# Patient Record
Sex: Female | Born: 1965 | Race: White | Hispanic: No | Marital: Single | Smoking: Current every day smoker
Health system: Southern US, Community
[De-identification: ages and names within clinical notes are randomized; demographics above are authoritative.]

## PROBLEM LIST (undated history)

## (undated) DIAGNOSIS — J189 Pneumonia, unspecified organism: Secondary | ICD-10-CM

## (undated) DIAGNOSIS — Z1371 Encounter for nonprocreative screening for genetic disease carrier status: Secondary | ICD-10-CM

## (undated) DIAGNOSIS — A64 Unspecified sexually transmitted disease: Secondary | ICD-10-CM

## (undated) DIAGNOSIS — B977 Papillomavirus as the cause of diseases classified elsewhere: Secondary | ICD-10-CM

## (undated) DIAGNOSIS — Z923 Personal history of irradiation: Secondary | ICD-10-CM

## (undated) DIAGNOSIS — S81852A Open bite, left lower leg, initial encounter: Secondary | ICD-10-CM

## (undated) DIAGNOSIS — C50919 Malignant neoplasm of unspecified site of unspecified female breast: Secondary | ICD-10-CM

## (undated) DIAGNOSIS — W540XXA Bitten by dog, initial encounter: Secondary | ICD-10-CM

## (undated) DIAGNOSIS — G4730 Sleep apnea, unspecified: Secondary | ICD-10-CM

## (undated) HISTORY — DX: Sleep apnea, unspecified: G47.30

## (undated) HISTORY — DX: Encounter for nonprocreative screening for genetic disease carrier status: Z13.71

## (undated) HISTORY — DX: Malignant neoplasm of unspecified site of unspecified female breast: C50.919

## (undated) HISTORY — DX: Unspecified sexually transmitted disease: A64

## (undated) HISTORY — PX: WISDOM TOOTH EXTRACTION: SHX21

## (undated) HISTORY — DX: Bitten by dog, initial encounter: W54.0XXA

## (undated) HISTORY — DX: Pneumonia, unspecified organism: J18.9

## (undated) HISTORY — DX: Papillomavirus as the cause of diseases classified elsewhere: B97.7

## (undated) HISTORY — DX: Open bite, left lower leg, initial encounter: S81.852A

---

## 2014-12-20 DIAGNOSIS — Z1371 Encounter for nonprocreative screening for genetic disease carrier status: Secondary | ICD-10-CM

## 2014-12-20 DIAGNOSIS — B977 Papillomavirus as the cause of diseases classified elsewhere: Secondary | ICD-10-CM

## 2014-12-20 DIAGNOSIS — Z923 Personal history of irradiation: Secondary | ICD-10-CM

## 2014-12-20 HISTORY — DX: Papillomavirus as the cause of diseases classified elsewhere: B97.7

## 2014-12-20 HISTORY — DX: Encounter for nonprocreative screening for genetic disease carrier status: Z13.71

## 2014-12-20 HISTORY — PX: BREAST LUMPECTOMY: SHX2

## 2014-12-20 HISTORY — DX: Personal history of irradiation: Z92.3

## 2015-02-18 DIAGNOSIS — J189 Pneumonia, unspecified organism: Secondary | ICD-10-CM

## 2015-02-18 HISTORY — DX: Pneumonia, unspecified organism: J18.9

## 2015-02-26 DIAGNOSIS — F17200 Nicotine dependence, unspecified, uncomplicated: Secondary | ICD-10-CM | POA: Insufficient documentation

## 2015-02-26 DIAGNOSIS — R590 Localized enlarged lymph nodes: Secondary | ICD-10-CM | POA: Insufficient documentation

## 2015-02-26 DIAGNOSIS — R911 Solitary pulmonary nodule: Secondary | ICD-10-CM | POA: Insufficient documentation

## 2015-02-26 DIAGNOSIS — R609 Edema, unspecified: Secondary | ICD-10-CM | POA: Insufficient documentation

## 2015-02-26 DIAGNOSIS — N390 Urinary tract infection, site not specified: Secondary | ICD-10-CM | POA: Insufficient documentation

## 2015-02-27 DIAGNOSIS — J9601 Acute respiratory failure with hypoxia: Secondary | ICD-10-CM | POA: Insufficient documentation

## 2015-02-27 DIAGNOSIS — D869 Sarcoidosis, unspecified: Secondary | ICD-10-CM | POA: Insufficient documentation

## 2015-02-27 DIAGNOSIS — J101 Influenza due to other identified influenza virus with other respiratory manifestations: Secondary | ICD-10-CM | POA: Insufficient documentation

## 2015-03-07 ENCOUNTER — Emergency Department: Payer: Self-pay | Admitting: Emergency Medicine

## 2015-03-07 DIAGNOSIS — S81852A Open bite, left lower leg, initial encounter: Secondary | ICD-10-CM

## 2015-03-07 DIAGNOSIS — W540XXA Bitten by dog, initial encounter: Secondary | ICD-10-CM

## 2015-03-07 HISTORY — DX: Bitten by dog, initial encounter: S81.852A

## 2015-03-07 HISTORY — DX: Bitten by dog, initial encounter: W54.0XXA

## 2015-03-18 ENCOUNTER — Emergency Department: Payer: Self-pay | Admitting: Emergency Medicine

## 2015-04-16 ENCOUNTER — Other Ambulatory Visit: Payer: Self-pay

## 2015-04-16 DIAGNOSIS — Z13820 Encounter for screening for osteoporosis: Secondary | ICD-10-CM

## 2015-05-05 ENCOUNTER — Ambulatory Visit
Admission: RE | Admit: 2015-05-05 | Discharge: 2015-05-05 | Disposition: A | Discharge status: Home / Self Care | Payer: Managed Care, Other (non HMO) | Source: Ambulatory Visit | Attending: Family Medicine | Admitting: Family Medicine

## 2015-05-05 DIAGNOSIS — R928 Other abnormal and inconclusive findings on diagnostic imaging of breast: Secondary | ICD-10-CM | POA: Insufficient documentation

## 2015-05-05 DIAGNOSIS — Z13820 Encounter for screening for osteoporosis: Secondary | ICD-10-CM

## 2015-05-05 DIAGNOSIS — Z1231 Encounter for screening mammogram for malignant neoplasm of breast: Secondary | ICD-10-CM | POA: Diagnosis not present

## 2015-05-12 ENCOUNTER — Other Ambulatory Visit: Payer: Self-pay | Admitting: Family Medicine

## 2015-05-12 DIAGNOSIS — N63 Unspecified lump in unspecified breast: Secondary | ICD-10-CM

## 2015-05-12 DIAGNOSIS — R928 Other abnormal and inconclusive findings on diagnostic imaging of breast: Secondary | ICD-10-CM

## 2015-05-13 ENCOUNTER — Ambulatory Visit
Admission: RE | Admit: 2015-05-13 | Discharge: 2015-05-13 | Disposition: A | Discharge status: Home / Self Care | Payer: Managed Care, Other (non HMO) | Source: Ambulatory Visit | Attending: Family Medicine | Admitting: Family Medicine

## 2015-05-13 ENCOUNTER — Other Ambulatory Visit: Payer: Self-pay | Admitting: Family Medicine

## 2015-05-13 ENCOUNTER — Ambulatory Visit: Payer: Managed Care, Other (non HMO)

## 2015-05-13 DIAGNOSIS — N63 Unspecified lump in unspecified breast: Secondary | ICD-10-CM

## 2015-05-13 DIAGNOSIS — R928 Other abnormal and inconclusive findings on diagnostic imaging of breast: Secondary | ICD-10-CM | POA: Diagnosis present

## 2015-05-13 DIAGNOSIS — Z13820 Encounter for screening for osteoporosis: Secondary | ICD-10-CM

## 2015-05-15 ENCOUNTER — Other Ambulatory Visit: Payer: Self-pay | Admitting: Family Medicine

## 2015-05-15 DIAGNOSIS — R928 Other abnormal and inconclusive findings on diagnostic imaging of breast: Secondary | ICD-10-CM

## 2015-05-15 DIAGNOSIS — N63 Unspecified lump in unspecified breast: Secondary | ICD-10-CM

## 2015-05-21 ENCOUNTER — Other Ambulatory Visit: Payer: Self-pay | Admitting: Family Medicine

## 2015-05-21 DIAGNOSIS — R928 Other abnormal and inconclusive findings on diagnostic imaging of breast: Secondary | ICD-10-CM

## 2015-05-21 DIAGNOSIS — N63 Unspecified lump in unspecified breast: Secondary | ICD-10-CM

## 2015-05-27 ENCOUNTER — Ambulatory Visit
Admission: RE | Admit: 2015-05-27 | Discharge: 2015-05-27 | Disposition: A | Discharge status: Home / Self Care | Payer: Managed Care, Other (non HMO) | Source: Ambulatory Visit | Attending: Family Medicine | Admitting: Family Medicine

## 2015-05-27 DIAGNOSIS — N63 Unspecified lump in unspecified breast: Secondary | ICD-10-CM

## 2015-05-27 DIAGNOSIS — R928 Other abnormal and inconclusive findings on diagnostic imaging of breast: Secondary | ICD-10-CM

## 2015-05-27 DIAGNOSIS — C50811 Malignant neoplasm of overlapping sites of right female breast: Secondary | ICD-10-CM | POA: Diagnosis not present

## 2015-05-27 HISTORY — PX: BREAST BIOPSY: SHX20

## 2015-05-28 DIAGNOSIS — C50919 Malignant neoplasm of unspecified site of unspecified female breast: Secondary | ICD-10-CM

## 2015-05-28 HISTORY — PX: COLPOSCOPY: SHX161

## 2015-05-28 HISTORY — PX: BREAST SURGERY: SHX581

## 2015-05-28 HISTORY — DX: Malignant neoplasm of unspecified site of unspecified female breast: C50.919

## 2015-05-30 ENCOUNTER — Telehealth: Payer: Self-pay

## 2015-05-30 NOTE — Telephone Encounter (Signed)
Phoned patient ,and PCP to introduce Navigation service.  Patient reports she just talked to Dr. Bary Castilla, and her surgical consult is scheduled for Monday 06/02/15 at 3:00.  She is going to come by the [facility] to pick up Breast Cancer Treatment Handbook/folder with hospital services prior to that visit. Will collect history and high risk information for Breast Case Conference at that time. Oncology Nurse Navigator Documentation  Oncology Nurse Navigator Flowsheets 05/30/2015  Referral date to RadOnc/MedOnc 05/30/2015  Navigator Encounter Type Introductory phone call  Time Spent with Patient 30

## 2015-06-02 ENCOUNTER — Ambulatory Visit (INDEPENDENT_AMBULATORY_CARE_PROVIDER_SITE_OTHER): Payer: Managed Care, Other (non HMO) | Admitting: General Surgery

## 2015-06-02 ENCOUNTER — Other Ambulatory Visit: Payer: Managed Care, Other (non HMO)

## 2015-06-02 ENCOUNTER — Encounter: Payer: Self-pay | Admitting: General Surgery

## 2015-06-02 VITALS — BP 118/64 | HR 64 | Resp 12 | Ht 63.0 in | Wt 223.0 lb

## 2015-06-02 DIAGNOSIS — C50911 Malignant neoplasm of unspecified site of right female breast: Secondary | ICD-10-CM | POA: Diagnosis not present

## 2015-06-02 NOTE — Progress Notes (Signed)
Patient ID: Paula [name], female   DOB: 04/18/1966, 49 y.o.   MRN: [id number]  Chief Complaint  Patient presents with  . Other    discuss biopsy    HPI Paula [name] is a 49 y.o. female here today to discuss her right breast biopsy. She had two right breast biopsies done on 05/28/15, one of which was positive for breast cancer. Does not perform self breast checks. She does not get regular mammograms. Her mother Paula [name] is present for exam. She has one child who is 83 years old.  The patient reported that her prior mammogram was 3-4 years ago at which time she had appreciated some nodularity in the breast was identified with breast cysts. She had not noticed any changes in her breast prior to the recent screening mammogram and subsequent additional studies. HPI  Past Medical History  Diagnosis Date  . STD (female)   . HPV in female 2016  . Cancer 05/28/15    breast  . Dog bite of left lower leg 03/07/15  . Pneumonia 02/2015    Past Surgical History  Procedure Laterality Date  . Colposcopy  05/28/15    High grade squamous intraepithelial lesion  . Breast surgery Right 05/28/15    Family History  Problem Relation Age of Onset  . Cancer Maternal Aunt 21    breast    Social History History  Substance Use Topics  . Smoking status: Current Every Day Smoker -- 1.00 packs/day for 36 years    Types: Cigarettes  . Smokeless tobacco: Never Used  . Alcohol Use: 0.0 oz/week    0 Standard drinks or equivalent per week     Comment: occ    No Known Allergies  No current outpatient prescriptions on file.   No current facility-administered medications for this visit.    Review of Systems Review of Systems  Constitutional: Negative.   Respiratory: Negative.   Cardiovascular: Negative.     Blood pressure 118/64, pulse 64, resp. rate 12, height $RemoveBe'5\' 3"'KQGlrCBmQ$  (1.6 m), weight 223 lb (101.152 kg), last menstrual period 05/07/2015, SpO2 93 %.  Physical Exam Physical Exam   Constitutional: She is oriented to person, place, and time. She appears well-developed and well-nourished.  Neck: No thyromegaly present.  Cardiovascular: Normal rate, regular rhythm and normal heart sounds.   Pulmonary/Chest: Effort normal and breath sounds normal. Right breast exhibits no inverted nipple, no mass, no nipple discharge, no skin change and no tenderness. Left breast exhibits no inverted nipple, no mass, no nipple discharge, no skin change and no tenderness.    Left breast 2 cm nodule at 1 o'clock  Right breast 3 o'clock 2 cm mass mild bruise  Lymphadenopathy:    She has no cervical adenopathy.    She has no axillary adenopathy.       Right: No supraclavicular adenopathy present.  Right axillary node slightly enlarged  Neurological: She is alert and oriented to person, place, and time.  Skin: Skin is warm and dry.    Data Reviewed  [facility] mammogram dated 05/05/2015 showed multiple nodular areas in the upper-outer quadrant of left breast as well as a new dominant mass in the medial aspect of the right breast. BI-RADS-0.  Ultrasound examination of the right breast showed a mass in the upper-inner quadrant at the 2:30 o'clock position measuring up to 1.9 cm. Ultrasound-guided biopsy was recommended. In the upper-outer quadrant of the breast of the 10:00 position a 03 lobulated area thought to represent a  complex cyst or papillary cystic tumor was identified measuring up to 2.3 cm. Biopsy was recommended. Axillary nodes were reported as normal. The left breast showed multiple cysts measuring up to 3 cm in diameter. BI-RADS-5.  The patient subsequently underwent ultrasound-guided core biopsy by the radiology service on 05/27/2015. Both sites were biopsied with a 12-gauge vacuum device. Post biopsy imaging was completed.  Biopsy results show the lesion at the 10:00 position to represent scattered microcysts and abundant strips of detached April current epithelium. No atypia or  malignancy. Correlation with radiologic imaging was recommended. The dominant mass at the 2:30 o'clock position represented invasive mammary carcinoma. Histologic grade 1. No lymphovascular invasion noted. Receptor status is pending now 6 days status post biopsy.  Making use of Care Everywhere laboratory studies through her PCPs office were reviewed. Normal liver function studies. Normal CBC. She did recently have an episode of pneumonia during which time a CXR and chest CT was obtained.  EXAM: CHEST TWO VIEW DATE: 02/26/15 18:30:08 ACCESSION: [id number] UN DICTATED: 02/26/15 18:39:42 INTERPRETATION LOCATION: [facility]  CLINICAL INDICATION: 49 Year Old (F):  - . COUGH    COMPARISON: None available.  TECHNIQUE:Upright PA and lateral views of the chest.  FINDINGS: The cardiac mediastinal silhouette is within normal limits.  The lungs are well-inflated. There are patchy bilateral interstitial and air space opacities in the mid to lower lungs, most prominent right midlung and bilateral lower lobes. There is a approximately 1.8 cm nodule overlying the left midlung zone, possibly in the superior segment left lower lobe. There is no evidence of pneumothorax or effusion.  There is no evidence of acute osseous abnormality or suspicious osseous lesion.  IMPRESSION: Lung findings suspicious for bronchopneumonia.  There is an indeterminate 1.8 cm pulmonary nodule which appears to be in the superior segment left lower lobe. This may represent primary lung cancer, metastasis, or less likely an infectious focus. Recommend nonemergent followup noncontrast chest CT.   EXAM: CT Chest without contrast  DATE: 02/27/15 09:09:46 ACCESSION: [id number] UN DICTATED: 02/27/15 09:55:52 INTERPRETATION LOCATION: [facility]  CLINICAL INDICATION: 49 Year Old (F):  Abnormal CXR    COMPARISON: Chest radiograph 02/26/15  TECHNIQUE: A spiral CT scan was obtained without IV contrast from the thoracic inlet  through the hemidiaphragms. Images were reconstructed axially at 5-mm increments.  Coronal and sagittal reformatted images of the chest were also provided for further evaluation of the lung parenchyma.    For all [facility] CT exams, radiation dose reduction device (automated exposure control) is used or manual techniques with radiation dose As Low As Reasonably Achievable (ALARA) protocol are followed using age and patient-size-specific scan parameters, while maintaining the necessary diagnostic image quality.  FINDINGS:  Thyroid: Unremarkable  Heart/vessels: Heart size is within normal limits. There is no pericardial effusion.   Lymph nodes/mediastinum: Calcified left hilar and subcentimeter prominent right hilar lymph nodes are seen. No other adenopathy. No axillary or thoracic inlet adenopathy.  Lungs/airways: There are perilymphatic nodular opacity seen in the right middle and upper lobes and within the lingula. There is a 1.3 cm calcified pulmonary nodule in the peripheral left upper lobe. No pleural effusion or pneumothorax. There is subsegmental atelectasis in both lung bases. Central tracheobronchial tree is clear.  Imaged abdomen: Lack of intravenous contrast limits evaluation of the solid abdominal organs. The unenhanced appearances of the visualized upper abdominal organs are unremarkable.  Bones/soft tissues: Small fluid density foci in the outer left breast (2:25). There is multilevel thoracic facet hypertrophy and osteophytosis. No  focal lesions identified within the visualized extrathoracic or extraperitoneal soft tissues.  IMPRESSION: 1. Calcified and noncalcified bilateral hilar lymphadenopathy with bilateral mid lung nodular opacities in perilymphatic/bronchovascular distribution is most consistent with pulmonary sarcoidosis. Atypical infection, such as mycobacterium, is a less likely consideration. 2. Calcified granuloma in the left upper lobe. 3. Small fluid density foci in the  left breast, likely benign cysts. Correlation with mammography recommended.  Evaluated by Ancil Linsey, MD from pulmonology.  PFT testing completed.  SPIROMETRY: FVC was 2.65 liters, 87% of predicted FEV1 was 2.02, 80% of predicted FEV1 ratio was 76 FEF 25-75% liters per second was 63% of predicted  LUNG VOLUMES: TLC was 92% of predicted RV was 94% of predicted  DIFFUSION CAPACITY: DLCO was 84% of predicted DLCO/VA was 123% of predicted  FLOW VOLUME LOOP: normal  Impression  Mild obstruction on spirometry Lung volumes and diffusion capacity are in normal range     Ultrasound examination of the breast and axilla was completed due to the findings at the 10:00 position as well as the mild prominence of the right axillary node on clinical exam.  The dominant mass at the 3:00 position 6 cm from the nipple measured 1.19 x 1.72 x 2.04 cm. This was irregular with intermittent shadowing and consistent with malignancy. Biopsy clip is evident.  The upper-outer quadrant of the right breast was scanned with no residual cystic mass consistent with a complex cyst resolved on vacuum biopsy.  The right axilla showed a 0.95 x 1.14 x 2.7 cm bilobed lymph node with preservation of the hilum but marked increase in size. The patient was amenable to FNA sampling and this was completed using 1 mL of 1% plain Xylocaine with multiple passes through both lobes of the node completed. Slides 4 were prepared for cytologic review. The procedure was well tolerated. BI-RADS-6.  Assessment    Right breast cancer. Clinically stage I.    Plan    We spent approximately an hour reviewing options for management and interventions that could better delineate her long-term risk. As she is under age 39 she is a candidate for BRCA testing. This would impact whether she would be best served by mastectomy versus breast conservation. A positive BRCA test would not preclude breast conservation, but certainly needs to  be discussed prior to intervention. Her family history is very modest for increased breast cancer risk, with only him maternal great aunt noted in the history intake form.  The mildly prominent lymph node appreciated on today's exam may be secondary to inflammatory changes from the 2 biopsies completed earlier, but certainly if this was positive for metastatic disease he would move her to stage II and she would become a candidate for new adjuvant treatment.  Due to the late hour, the patient was asked to return to more morning for BRCA screening as well as to obtain a CA 27-29 laboratory exam.  An informational brochure was provided. When her hormone receptor status is available this may necessitate early medical oncology assessment if she is triple negative. Based on present tumor size she would be a candidate for new adjuvant treatment with or without axillary adenopathy.     PCP: Paula [name] 06/03/2015, 8:14 AM

## 2015-06-02 NOTE — Progress Notes (Signed)
Met with patient, and her mother Mechele Claude.  Gave Breast Cancer Treatment Handbook/folder with hospital info.  Patient states she has a good support system.  Encouraged to call with navigtion needs.  She is going for initial Surgical Consult this afternoon with Dr. Bary Castilla.

## 2015-06-03 ENCOUNTER — Ambulatory Visit (INDEPENDENT_AMBULATORY_CARE_PROVIDER_SITE_OTHER): Payer: Managed Care, Other (non HMO) | Admitting: *Deleted

## 2015-06-03 DIAGNOSIS — C50911 Malignant neoplasm of unspecified site of right female breast: Secondary | ICD-10-CM

## 2015-06-03 DIAGNOSIS — C50919 Malignant neoplasm of unspecified site of unspecified female breast: Secondary | ICD-10-CM | POA: Insufficient documentation

## 2015-06-03 NOTE — Progress Notes (Signed)
BRCA completed

## 2015-06-03 NOTE — Patient Instructions (Signed)
The patient is aware to call back for any questions or concerns.  

## 2015-06-04 ENCOUNTER — Telehealth: Payer: Self-pay | Admitting: General Surgery

## 2015-06-04 LAB — SURGICAL PATHOLOGY

## 2015-06-04 LAB — CANCER ANTIGEN 27.29: CA 27.29: 15.2 U/mL (ref 0.0–38.6)

## 2015-06-04 NOTE — Telephone Encounter (Signed)
A message was left for the patient that the cytology on the axillary node was negative for metastatic disease.

## 2015-06-19 ENCOUNTER — Telehealth: Payer: Self-pay | Admitting: *Deleted

## 2015-06-19 NOTE — Telephone Encounter (Signed)
Paula [name] # [phone] wants you to call her back when you get a chance. She is a Dietitian and its regarding Paula [name] 06-Jan-2066. She will be in the office till about 4:30pm today. I called Izora Gala and the patient is BRCA1 and BRCA 2 negative.

## 2015-06-19 NOTE — Telephone Encounter (Signed)
Patient notified as instructed and she verbalizes understanding.   This patient has been scheduled for a follow up appointment with Dr. Bary Castilla on 06-24-15 at 10 am.

## 2015-06-19 NOTE — Telephone Encounter (Signed)
-----  Message from Robert Bellow, MD sent at 06/19/2015  3:29 PM EDT ----- Notify patient BRCA testing is negative.  Need to arrange office visit to plan for management of the right breast cancer.  Thanks.

## 2015-06-24 ENCOUNTER — Ambulatory Visit (INDEPENDENT_AMBULATORY_CARE_PROVIDER_SITE_OTHER): Payer: Managed Care, Other (non HMO) | Admitting: General Surgery

## 2015-06-24 ENCOUNTER — Encounter: Payer: Self-pay | Admitting: General Surgery

## 2015-06-24 ENCOUNTER — Other Ambulatory Visit: Payer: Managed Care, Other (non HMO)

## 2015-06-24 VITALS — BP 110/66 | HR 86 | Resp 12 | Ht 63.5 in | Wt 222.0 lb

## 2015-06-24 DIAGNOSIS — C50911 Malignant neoplasm of unspecified site of right female breast: Secondary | ICD-10-CM

## 2015-06-24 DIAGNOSIS — C50211 Malignant neoplasm of upper-inner quadrant of right female breast: Secondary | ICD-10-CM | POA: Diagnosis not present

## 2015-06-24 DIAGNOSIS — Z170 Estrogen receptor positive status [ER+]: Secondary | ICD-10-CM | POA: Insufficient documentation

## 2015-06-24 NOTE — Patient Instructions (Addendum)
The patient is aware to call back for any questions or concerns.  Patient is scheduled for surgery at [facility] on 07/17/15. She will pre admit by phone on 07/10/15. She will arrive at the hospital on 07/17/15 at 7:45 am and report to the Radiology desk. Patient is aware of dates, time, and instructions.

## 2015-06-24 NOTE — Progress Notes (Signed)
Patient ID: Paula [name], female   DOB: 06/28/1966, 49 y.o.   MRN: [id number]  Chief Complaint  Patient presents with  . Follow-up    HPI Paula [name] is a 49 y.o. female.  Here today to discuss breast cancer treatment options. BRCA negative. We reviewed her options for surgical management. Her recent BR CA test was negative for mutation.  She is a good candidate for either breast conservation or mastectomy. At this time she is most interested in breast conservation.    HPI  Past Medical History  Diagnosis Date  . STD (female)   . HPV in female 2016  . Dog bite of left lower leg 03/07/15  . Pneumonia 02/2015  . BRCA negative   . Cancer 05/28/15    breast/ER positive, PR positive, HER-2/neu not overexpressed    Past Surgical History  Procedure Laterality Date  . Colposcopy  05/28/15    High grade squamous intraepithelial lesion  . Breast surgery Right 05/28/15    Family History  Problem Relation Age of Onset  . Cancer Maternal Aunt 55    breast    Social History History  Substance Use Topics  . Smoking status: Current Every Day Smoker -- 1.00 packs/day for 36 years    Types: Cigarettes  . Smokeless tobacco: Never Used  . Alcohol Use: 0.0 oz/week    0 Standard drinks or equivalent per week     Comment: occ    No Known Allergies  No current outpatient prescriptions on file.   No current facility-administered medications for this visit.    Review of Systems Review of Systems  Constitutional: Negative.   Respiratory: Negative.   Cardiovascular: Negative.     Blood pressure 110/66, pulse 86, resp. rate 12, height 5' 3.5" (1.613 m), weight 222 lb (100.699 kg), last menstrual period 06/03/2015, SpO2 97 %.  Physical Exam Physical Exam  Constitutional: She is oriented to person, place, and time. She appears well-developed and well-nourished.  HENT:  Mouth/Throat: Oropharynx is clear and moist.  Eyes: Conjunctivae are normal. No scleral icterus.  Neck:  Neck supple.  Cardiovascular: Normal rate, regular rhythm and normal heart sounds.   Pulmonary/Chest: Effort normal and breath sounds normal.    Lymphadenopathy:    She has no cervical adenopathy.  Neurological: She is alert and oriented to person, place, and time.  Skin: Skin is warm and dry.  Psychiatric: She has a normal mood and affect.    Data Reviewed Ultrasound examination was undertaken to confirm that the biopsy site can be well identified.  At the right breast in the 3:00 position, 7 cm from the nipple a well-defined 1.2 x 1.6 x 1.7 cm irregular hypoechoic mass with an enclosed clip is evident. BI-RADS-6.  In the axilla and a large lymph node with slight distortion of the hilum is again noted measuring up to 1.6 cm in maximum diameter. This is 12 cm from the nipple. Previous cytology of this lymph node was negative.  Assessment    Stage I breast cancer, desire for breast conservation.    Plan    The patient has a car trip planned to [place] and then on to [place] towards the end of this month. She will be driving a good portion of this. She is going to be visiting her closest friend living in [place].  We'll defer surgical intervention until her return, and the off chance that axillary dissection would be necessary and make driving hazardous.  The role of postoperative  radiation therapy was reviewed. This is independent of any recommendation for chemotherapy. Chemotherapy may be indicated based on nodal status or Oncotype DX testing based on her tumor size.       Schedule surgery after her trip to [place].  Patient is scheduled for surgery at [facility] on 07/17/15. She will pre admit by phone on 07/10/15. She will arrive at the hospital on 07/17/15 at 7:45 am and report to the Radiology desk. Patient is aware of dates, time, and instructions.   PCP:  Burgess Estelle 06/24/2015, 2:41 PM

## 2015-06-24 NOTE — H&P (Addendum)
Patient ID: Paula [name], female DOB: March 29, 1966, 49 y.o. MRN: [id number]  Chief Complaint   Patient presents with   .  Follow-up    HPI  Paula [name] is a 49 y.o. female. Here today to discuss breast cancer treatment options. BRCA negative.  We reviewed her options for surgical management. Her recent BR CA test was negative for mutation.  She is a good candidate for either breast conservation or mastectomy. At this time she is most interested in breast conservation.  HPI  Past Medical History   Diagnosis  Date   .  STD (female)    .  HPV in female  2016   .  Dog bite of left lower leg  03/07/15   .  Pneumonia  02/2015   .  BRCA negative    .  Cancer  05/28/15     breast/ER positive, PR positive, HER-2/neu not overexpressed    Past Surgical History   Procedure  Laterality  Date   .  Colposcopy   05/28/15     High grade squamous intraepithelial lesion   .  Breast surgery  Right  05/28/15    Family History   Problem  Relation  Age of Onset   .  Cancer  Maternal Aunt  63     breast    Social History  History   Substance Use Topics   .  Smoking status:  Current Every Day Smoker -- 1.00 packs/day for 36 years     Types:  Cigarettes   .  Smokeless tobacco:  Never Used   .  Alcohol Use:  0.0 oz/week     0 Standard drinks or equivalent per week      Comment: occ    No Known Allergies  No current outpatient prescriptions on file.    No current facility-administered medications for this visit.    Review of Systems  Review of Systems  Constitutional: Negative.  Respiratory: Negative.  Cardiovascular: Negative.   Blood pressure 110/66, pulse 86, resp. rate 12, height 5' 3.5" (1.613 m), weight 222 lb (100.699 kg), last menstrual period 06/03/2015, SpO2 97 %.  Physical Exam  Physical Exam  Constitutional: She is oriented to person, place, and time. She appears well-developed and well-nourished.  HENT:  Mouth/Throat: Oropharynx is clear and moist.  Eyes: Conjunctivae  are normal. No scleral icterus.  Neck: Neck supple.  Cardiovascular: Normal rate, regular rhythm and normal heart sounds.  Pulmonary/Chest: Effort normal and breath sounds normal.    Lymphadenopathy:  She has no cervical adenopathy.  Neurological: She is alert and oriented to person, place, and time.  Skin: Skin is warm and dry.  Psychiatric: She has a normal mood and affect.   Data Reviewed  Ultrasound examination was undertaken to confirm that the biopsy site can be well identified.  At the right breast in the 3:00 position, 7 cm from the nipple a well-defined 1.2 x 1.6 x 1.7 cm irregular hypoechoic mass with an enclosed clip is evident. BI-RADS-6.  In the axilla and a large lymph node with slight distortion of the hilum is again noted measuring up to 1.6 cm in maximum diameter. This is 12 cm from the nipple. Previous cytology of this lymph node was negative.  Assessment   Stage I breast cancer, desire for breast conservation.   Plan   The patient has a car trip planned to [place] and then on to [place] towards the end of this month. She will be driving  a good portion of this. She is going to be visiting her closest friend living in [place].  We'll defer surgical intervention until her return, and the off chance that axillary dissection would be necessary and make driving hazardous.  The role of postoperative radiation therapy was reviewed. This is independent of any recommendation for chemotherapy. Chemotherapy may be indicated based on nodal status or Oncotype DX testing based on her tumor size.   Schedule surgery after her trip to [place].  Patient is scheduled for surgery at [facility] on 07/17/15. She will pre admit by phone on 07/10/15. She will arrive at the hospital on 07/17/15 at 7:45 am and report to the Radiology desk. Patient is aware of dates, time, and instructions.  PCP: Burgess Estelle  06/24/2015, 2:41 PM

## 2015-07-08 ENCOUNTER — Telehealth: Payer: Self-pay | Admitting: *Deleted

## 2015-07-08 NOTE — Telephone Encounter (Signed)
FMLA forms

## 2015-07-10 ENCOUNTER — Inpatient Hospital Stay: Admission: RE | Admit: 2015-07-10 | Payer: Managed Care, Other (non HMO) | Source: Ambulatory Visit

## 2015-07-10 ENCOUNTER — Encounter: Payer: Self-pay | Admitting: *Deleted

## 2015-07-10 MED ORDER — BUPIVACAINE LIPOSOME 1.3 % IJ SUSP
INTRAMUSCULAR | Status: AC
  Filled 2015-07-10: qty 20

## 2015-07-10 MED ORDER — MORPHINE SULFATE 4 MG/ML IJ SOLN
INTRAMUSCULAR | Status: AC
  Filled 2015-07-10: qty 1

## 2015-07-10 MED ORDER — NEOMYCIN-POLYMYXIN B GU 40-200000 IR SOLN
Status: AC
  Filled 2015-07-10: qty 20

## 2015-07-10 MED ORDER — BUPIVACAINE-EPINEPHRINE (PF) 0.5% -1:200000 IJ SOLN
INTRAMUSCULAR | Status: AC
Start: 2015-07-10 — End: 2015-07-10
  Filled 2015-07-10: qty 30

## 2015-07-10 NOTE — Patient Instructions (Signed)
  Your procedure is scheduled on: 07-17-15 Report to [facility] (2ND DESK ON RIGHT ) @ 7:45 AM   Remember: Instructions that are not followed completely may result in serious medical risk, up to and including death, or upon the discretion of your surgeon and anesthesiologist your surgery may need to be rescheduled.    _X___ 1. Do not eat food or drink liquids after midnight. No gum chewing or hard candies.     _X___ 2. No Alcohol for 24 hours before or after surgery.   ____ 3. Bring all medications with you on the day of surgery if instructed.    ____ 4. Notify your doctor if there is any change in your medical condition     (cold, fever, infections).     Do not wear jewelry, make-up, hairpins, clips or nail polish.  Do not wear lotions, powders, or perfumes. You may wear deodorant.  Do not shave 48 hours prior to surgery. Men may shave face and neck.  Do not bring valuables to the hospital.    [facility] is not responsible for any belongings or valuables.               Contacts, dentures or bridgework may not be worn into surgery.  Leave your suitcase in the car. After surgery it may be brought to your room.  For patients admitted to the hospital, discharge time is determined by your treatment team.   Patients discharged the day of surgery will not be allowed to drive home.   Please read over the following fact sheets that you were given:    ____ Take these medicines the morning of surgery with A SIP OF WATER:    1. NONE  2.   3.   4.  5.  6.  ____ Fleet Enema (as directed)   ____ Use CHG Soap as directed  ____ Use inhalers on the day of surgery  ____ Stop metformin 2 days prior to surgery    ____ Take 1/2 of usual insulin dose the night before surgery and none on the morning of surgery.   ____ Stop Coumadin/Plavix/aspirin-N/A  ____ Stop Anti-inflammatories-NO NSAIDS OR ASA PRODUCTS-TYLENOL OK   ____ Stop supplements until after surgery.     ____ Bring C-Pap to the hospital.

## 2015-07-16 ENCOUNTER — Encounter: Payer: Self-pay | Admitting: *Deleted

## 2015-07-16 NOTE — Progress Notes (Signed)
Patient reports that her last menstrual period was 07-05-15.   Abby in Nuclear Medicine notified accordingly.  We will proceed with surgery that is scheduled at [facility] for 07-17-15.

## 2015-07-17 ENCOUNTER — Encounter: Payer: Self-pay | Admitting: Oncology

## 2015-07-17 ENCOUNTER — Ambulatory Visit
Admission: RE | Admit: 2015-07-17 | Discharge: 2015-07-17 | Disposition: A | Discharge status: Home / Self Care | Payer: Managed Care, Other (non HMO) | Source: Ambulatory Visit | Attending: General Surgery | Admitting: General Surgery

## 2015-07-17 ENCOUNTER — Ambulatory Visit: Payer: Managed Care, Other (non HMO) | Admitting: Anesthesiology

## 2015-07-17 ENCOUNTER — Encounter
Admission: RE | Disposition: A | Discharge status: Home / Self Care | Payer: Self-pay | Source: Ambulatory Visit | Attending: General Surgery

## 2015-07-17 ENCOUNTER — Ambulatory Visit: Payer: Managed Care, Other (non HMO)

## 2015-07-17 ENCOUNTER — Encounter: Payer: Self-pay | Admitting: *Deleted

## 2015-07-17 DIAGNOSIS — N6001 Solitary cyst of right breast: Secondary | ICD-10-CM | POA: Diagnosis not present

## 2015-07-17 DIAGNOSIS — Z170 Estrogen receptor positive status [ER+]: Secondary | ICD-10-CM | POA: Insufficient documentation

## 2015-07-17 DIAGNOSIS — N6081 Other benign mammary dysplasias of right breast: Secondary | ICD-10-CM | POA: Insufficient documentation

## 2015-07-17 DIAGNOSIS — C773 Secondary and unspecified malignant neoplasm of axilla and upper limb lymph nodes: Secondary | ICD-10-CM | POA: Diagnosis not present

## 2015-07-17 DIAGNOSIS — C50911 Malignant neoplasm of unspecified site of right female breast: Secondary | ICD-10-CM | POA: Diagnosis not present

## 2015-07-17 DIAGNOSIS — Z9889 Other specified postprocedural states: Secondary | ICD-10-CM

## 2015-07-17 DIAGNOSIS — D241 Benign neoplasm of right breast: Secondary | ICD-10-CM | POA: Insufficient documentation

## 2015-07-17 DIAGNOSIS — Z853 Personal history of malignant neoplasm of breast: Secondary | ICD-10-CM | POA: Diagnosis not present

## 2015-07-17 DIAGNOSIS — F17200 Nicotine dependence, unspecified, uncomplicated: Secondary | ICD-10-CM | POA: Insufficient documentation

## 2015-07-17 DIAGNOSIS — C50111 Malignant neoplasm of central portion of right female breast: Secondary | ICD-10-CM | POA: Insufficient documentation

## 2015-07-17 DIAGNOSIS — F17210 Nicotine dependence, cigarettes, uncomplicated: Secondary | ICD-10-CM | POA: Diagnosis not present

## 2015-07-17 DIAGNOSIS — C779 Secondary and unspecified malignant neoplasm of lymph node, unspecified: Secondary | ICD-10-CM | POA: Diagnosis not present

## 2015-07-17 DIAGNOSIS — N63 Unspecified lump in breast: Secondary | ICD-10-CM | POA: Diagnosis present

## 2015-07-17 HISTORY — PX: BREAST LUMPECTOMY WITH SENTINEL LYMPH NODE BIOPSY: SHX5597

## 2015-07-17 LAB — POCT PREGNANCY, URINE: PREG TEST UR: NEGATIVE

## 2015-07-17 SURGERY — BREAST LUMPECTOMY WITH SENTINEL LYMPH NODE BX
Anesthesia: General | Laterality: Right | Wound class: Clean

## 2015-07-17 MED ORDER — HYDROCODONE-ACETAMINOPHEN 5-325 MG PO TABS
1.0000 | ORAL_TABLET | ORAL | Status: DC | PRN
  Administered 2015-07-17: 1 via ORAL

## 2015-07-17 MED ORDER — HYDROCODONE-ACETAMINOPHEN 5-325 MG PO TABS: 1.0000 | ORAL_TABLET | ORAL | Status: DC | PRN

## 2015-07-17 MED ORDER — MIDAZOLAM HCL 5 MG/5ML IJ SOLN
INTRAMUSCULAR | Status: DC | PRN
  Administered 2015-07-17: 2 mg via INTRAVENOUS

## 2015-07-17 MED ORDER — BUPIVACAINE-EPINEPHRINE (PF) 0.5% -1:200000 IJ SOLN
INTRAMUSCULAR | Status: AC
  Filled 2015-07-17: qty 30

## 2015-07-17 MED ORDER — FENTANYL CITRATE (PF) 100 MCG/2ML IJ SOLN
INTRAMUSCULAR | Status: DC | PRN
  Administered 2015-07-17 (×4): 50 ug via INTRAVENOUS

## 2015-07-17 MED ORDER — METHYLENE BLUE 1 % INJ SOLN
INTRAMUSCULAR | Status: DC | PRN
  Administered 2015-07-17: 1.5 mL via SUBMUCOSAL

## 2015-07-17 MED ORDER — LIDOCAINE HCL (CARDIAC) 20 MG/ML IV SOLN
INTRAVENOUS | Status: DC | PRN
  Administered 2015-07-17: 80 mg via INTRAVENOUS

## 2015-07-17 MED ORDER — ONDANSETRON HCL 4 MG/2ML IJ SOLN
INTRAMUSCULAR | Status: DC | PRN
  Administered 2015-07-17: 4 mg via INTRAVENOUS

## 2015-07-17 MED ORDER — ONDANSETRON HCL 4 MG/2ML IJ SOLN: 4.0000 mg | Freq: Once | INTRAMUSCULAR | Status: DC | PRN

## 2015-07-17 MED ORDER — TECHNETIUM TC 99M SULFUR COLLOID
1.0260 | Freq: Once | INTRAVENOUS | Status: AC | PRN
  Administered 2015-07-17: 1.026 via INTRAVENOUS

## 2015-07-17 MED ORDER — PROPOFOL 10 MG/ML IV BOLUS
INTRAVENOUS | Status: DC | PRN
  Administered 2015-07-17: 200 mg via INTRAVENOUS

## 2015-07-17 MED ORDER — FAMOTIDINE 20 MG PO TABS
ORAL_TABLET | ORAL | Status: AC
  Administered 2015-07-17: 20 mg via ORAL
  Filled 2015-07-17: qty 1

## 2015-07-17 MED ORDER — KETOROLAC TROMETHAMINE 30 MG/ML IJ SOLN
INTRAMUSCULAR | Status: DC | PRN
  Administered 2015-07-17: 30 mg via INTRAVENOUS

## 2015-07-17 MED ORDER — ACETAMINOPHEN 10 MG/ML IV SOLN
INTRAVENOUS | Status: DC | PRN
  Administered 2015-07-17: 1000 mg via INTRAVENOUS

## 2015-07-17 MED ORDER — BUPIVACAINE HCL (PF) 0.5 % IJ SOLN
INTRAMUSCULAR | Status: AC
  Filled 2015-07-17: qty 30

## 2015-07-17 MED ORDER — FENTANYL CITRATE (PF) 100 MCG/2ML IJ SOLN: 25.0000 ug | INTRAMUSCULAR | Status: DC | PRN

## 2015-07-17 MED ORDER — DEXAMETHASONE SODIUM PHOSPHATE 10 MG/ML IJ SOLN
INTRAMUSCULAR | Status: DC | PRN
  Administered 2015-07-17: 10 mg via INTRAVENOUS

## 2015-07-17 MED ORDER — FAMOTIDINE 20 MG PO TABS
20.0000 mg | ORAL_TABLET | Freq: Once | ORAL | Status: AC
  Administered 2015-07-17: 20 mg via ORAL

## 2015-07-17 MED ORDER — SODIUM CHLORIDE 0.9 % IJ SOLN
INTRAMUSCULAR | Status: AC
  Filled 2015-07-17: qty 10

## 2015-07-17 MED ORDER — LACTATED RINGERS IV SOLN
INTRAVENOUS | Status: DC
  Administered 2015-07-17 (×2): via INTRAVENOUS

## 2015-07-17 MED ORDER — ACETAMINOPHEN 10 MG/ML IV SOLN
INTRAVENOUS | Status: AC
  Filled 2015-07-17: qty 100

## 2015-07-17 MED ORDER — BUPIVACAINE-EPINEPHRINE (PF) 0.5% -1:200000 IJ SOLN
INTRAMUSCULAR | Status: DC | PRN
  Administered 2015-07-17: 30 mL

## 2015-07-17 MED ORDER — METHYLENE BLUE 1 % INJ SOLN
INTRAMUSCULAR | Status: AC
  Filled 2015-07-17: qty 10

## 2015-07-17 SURGICAL SUPPLY — 51 items
BANDAGE ELASTIC 6 CLIP NS LF (GAUZE/BANDAGES/DRESSINGS) ×3 IMPLANT
BENZOIN TINCTURE PRP APPL 2/3 (GAUZE/BANDAGES/DRESSINGS) ×3 IMPLANT
BLADE SURG 15 STRL SS SAFETY (BLADE) ×6 IMPLANT
BNDG GAUZE 4.5X4.1 6PLY STRL (MISCELLANEOUS) ×3 IMPLANT
BNDG STRETCH 4X75 STRL LF (GAUZE/BANDAGES/DRESSINGS) ×3 IMPLANT
BULB RESERV EVAC DRAIN JP 100C (MISCELLANEOUS) IMPLANT
CANISTER SUCT 1200ML W/VALVE (MISCELLANEOUS) ×3 IMPLANT
CHLORAPREP W/TINT 26ML (MISCELLANEOUS) ×3 IMPLANT
CLOSURE WOUND 1/2 X4 (GAUZE/BANDAGES/DRESSINGS) ×1
CNTNR SPEC 2.5X3XGRAD LEK (MISCELLANEOUS) ×2
CONT SPEC 4OZ STER OR WHT (MISCELLANEOUS) ×4
CONTAINER SPEC 2.5X3XGRAD LEK (MISCELLANEOUS) ×2 IMPLANT
COVER PROBE FLX POLY STRL (MISCELLANEOUS) ×3 IMPLANT
DEVICE DUBIN SPECIMEN MAMMOGRA (MISCELLANEOUS) ×3 IMPLANT
DRAIN CHANNEL JP 15F RND 16 (MISCELLANEOUS) IMPLANT
DRAPE LAPAROTOMY TRNSV 106X77 (MISCELLANEOUS) ×3 IMPLANT
DRESSING TELFA 4X3 1S ST N-ADH (GAUZE/BANDAGES/DRESSINGS) ×3 IMPLANT
DRSG TEGADERM 4X4.75 (GAUZE/BANDAGES/DRESSINGS) ×3 IMPLANT
DRSG TELFA 3X8 NADH (GAUZE/BANDAGES/DRESSINGS) ×3 IMPLANT
ELECT CAUTERY BLADE TIP 2.5 (TIP) ×3
ELECTRODE CAUTERY BLDE TIP 2.5 (TIP) ×1 IMPLANT
GAUZE FLUFF 18X24 1PLY STRL (GAUZE/BANDAGES/DRESSINGS) ×3 IMPLANT
GAUZE SPONGE 4X4 12PLY STRL (GAUZE/BANDAGES/DRESSINGS) ×3 IMPLANT
GLOVE BIO SURGEON STRL SZ7.5 (GLOVE) ×6 IMPLANT
GLOVE INDICATOR 8.0 STRL GRN (GLOVE) ×6 IMPLANT
GOWN STRL REUS W/ TWL LRG LVL3 (GOWN DISPOSABLE) ×2 IMPLANT
GOWN STRL REUS W/TWL LRG LVL3 (GOWN DISPOSABLE) ×4
HARMONIC SCALPEL FOCUS (MISCELLANEOUS) IMPLANT
KIT RM TURNOVER STRD PROC AR (KITS) ×3 IMPLANT
LABEL OR SOLS (LABEL) ×3 IMPLANT
MARGIN MAP 10MM (MISCELLANEOUS) ×3 IMPLANT
NDL SAFETY 22GX1.5 (NEEDLE) ×3 IMPLANT
NDL SAFETY 25GX1.5 (NEEDLE) ×3 IMPLANT
PACK BASIN MINOR ARMC (MISCELLANEOUS) ×3 IMPLANT
PAD GROUND ADULT SPLIT (MISCELLANEOUS) ×3 IMPLANT
SHEARS FOC LG CVD HARMONIC 17C (MISCELLANEOUS) IMPLANT
SLEVE PROBE SENORX GAMMA FIND (MISCELLANEOUS) ×3 IMPLANT
STRIP CLOSURE SKIN 1/2X4 (GAUZE/BANDAGES/DRESSINGS) ×2 IMPLANT
SUT ETHILON 3-0 FS-10 30 BLK (SUTURE) ×6
SUT SILK 2 0 (SUTURE)
SUT SILK 2-0 18XBRD TIE 12 (SUTURE) IMPLANT
SUT VIC AB 2-0 CT1 27 (SUTURE) ×6
SUT VIC AB 2-0 CT1 TAPERPNT 27 (SUTURE) ×3 IMPLANT
SUT VICRYL+ 3-0 144IN (SUTURE) ×3 IMPLANT
SUTURE EHLN 3-0 FS-10 30 BLK (SUTURE) ×2 IMPLANT
SWABSTK COMLB BENZOIN TINCTURE (MISCELLANEOUS) ×3 IMPLANT
SYR BULB IRRIG 60ML STRL (SYRINGE) ×3 IMPLANT
SYR CONTROL 10ML (SYRINGE) ×3 IMPLANT
SYRINGE 10CC LL (SYRINGE) ×3 IMPLANT
TAPE TRANSPORE STRL 2 31045 (GAUZE/BANDAGES/DRESSINGS) IMPLANT
WATER STERILE IRR 1000ML POUR (IV SOLUTION) ×3 IMPLANT

## 2015-07-17 NOTE — Op Note (Signed)
Preoperative diagnosis: Right breast cancer.  Postoperative diagnosis: Same.  Operative procedure: Right breast wide excision with mastoplasty, sentinel node biopsy.  Operative surgeon: Hervey Ard, M.D.  Anesthesia: Gen. by LMA. Marcaine, 0.5% with 1-200,000 of epinephrine, 30 mL local infiltration.  Estimated blood loss: 10 mL.  Clinical note: This 50 year old female was noted with a right breast mass core biopsy which showed evidence of invasive mammary carcinoma. An enlarged right axillary node was negative for metastatic disease on FNA sampling. She desired breast conservation. She was injected with technetium sulfur colloid prior to procedure.  Operative note: With the patient under adequate general anesthesia 5 mL of saline/methylene blue diluted to 1 was injected in the subareolar plexus. The breast was then prepped chlor prep and drape. She was turned towards the breast and the area of the tumor mass in the 3:00 position was outlined by ultrasonography. It was fairly close to the overlying skin and for that reason an ellipse of skin was taken with the specimen beginning at the edge of the areola and extending towards the sternum. The skin was incised sharply and the remaining dissection completed with electrocautery. The specimen was removed, orientated and specimen radiograph obtained. The previously placed clip was in the lesion. The specimen was closest to the medial margin. Examination of the area here showed possible biopsy site changes at the inferior medial aspect and an additional centimeter of tissue was excised, orientated and sent in formalin for routine histology. The breast was then elevated off the underlying pectoralis fascia with cautery for approximately 5 cm in all directions. This was an rod together with a running 2-0 Vicryls suture. The layers of adipose tissue were approximated with running 2-0 Vicryls suture. Breast flaps for 5 cm were elevated with cautery to smooth  the contour of the breast. The skin was approximated with a running 3-0 Vicryls suture.  Attention was turned to the axilla. The gamma finder showed increased uptake in the lower aspect. Marcaine was infiltrated as had been done of the wide excision site for postoperative analgesia. A transverse skin line incision was made and carried down through skin subtenon's tissue. Hemostasis was electrocautery. A moderately sized 1.6-1.8 cm blue hot lymph node was removed and adjacent to this a second smaller 7 mm hot, non-blue node. These were sent for frozen section. While the report was pending and if third non-blue, hot node was identified in the lower aspect of the axilla. The pathology report showed a 4 mm metastasis in the largest node in the other 2 negative for macro metastatic disease. It was elected to defer decision on formal axillary dissection pending final report on the pathology.  The axillary wound was closed with 2-0 Vicryls figure-of-eight sutures. The skin was closed with a running 4-0 Vicryl  suture.  Benzoin, Steri-Strips were applied. Telfa pad was placed. Fluff gauze, Kerlix and an Ace wrap was then applied.  The patient tolerated the procedure well and was taken to the recovery room in stable condition.

## 2015-07-17 NOTE — Anesthesia Postprocedure Evaluation (Signed)
  Anesthesia Post-op Note  Patient: Paula [name]  Procedure(s) Performed: Procedure(s): RIGHT BREAST WIDE EXCISION WITH SENTINEL NODE BIOPSY (Right)  Anesthesia type:General  Patient location: PACU  Post pain: Pain level controlled  Post assessment: Post-op Vital signs reviewed, Patient's Cardiovascular Status Stable, Respiratory Function Stable, Patent Airway and No signs of Nausea or vomiting  Post vital signs: Reviewed and stable  Last Vitals:  Filed Vitals:   07/17/15 1155  BP: 110/62  Pulse: 79  Temp: 37.4 C  Resp: 14    Level of consciousness: awake, alert  and patient cooperative  Complications: No apparent anesthesia complications

## 2015-07-17 NOTE — Discharge Instructions (Signed)

## 2015-07-17 NOTE — Anesthesia Procedure Notes (Signed)
Procedure Name: LMA Insertion Date/Time: 07/17/2015 10:20 AM Performed by: Delaney Meigs Pre-anesthesia Checklist: Patient identified, Emergency Drugs available, Suction available, Patient being monitored and Timeout performed Patient Re-evaluated:Patient Re-evaluated prior to inductionOxygen Delivery Method: Circle system utilized and Simple face mask Preoxygenation: Pre-oxygenation with 100% oxygen Intubation Type: IV induction Ventilation: Mask ventilation without difficulty LMA Size: 3.5 Number of attempts: 1 Placement Confirmation: breath sounds checked- equal and bilateral and positive ETCO2 Tube secured with: Tape

## 2015-07-17 NOTE — OR Nursing (Signed)
Dr. Tollie Pizza notified that patient is doing well and desires to go home without an MD visit. He gave permission to DC home.

## 2015-07-17 NOTE — Anesthesia Preprocedure Evaluation (Signed)
Anesthesia Evaluation  Patient identified by MRN, date of birth, ID band Patient awake    Reviewed: Allergy & Precautions, NPO status , Patient's Chart, lab work & pertinent test results  Airway Mallampati: II  TM Distance: >3 FB Neck ROM: Full    Dental  (+) Teeth Intact   Pulmonary Current Smoker (1 ppd),          Cardiovascular     Neuro/Psych    GI/Hepatic   Endo/Other    Renal/GU      Musculoskeletal   Abdominal   Peds  Hematology   Anesthesia Other Findings   Reproductive/Obstetrics                             Anesthesia Physical Anesthesia Plan  ASA: II  Anesthesia Plan: General   Post-op Pain Management:    Induction: Intravenous  Airway Management Planned: LMA  Additional Equipment:   Intra-op Plan:   Post-operative Plan:   Informed Consent: I have reviewed the patients History and Physical, chart, labs and discussed the procedure including the risks, benefits and alternatives for the proposed anesthesia with the patient or authorized representative who has indicated his/her understanding and acceptance.     Plan Discussed with:   Anesthesia Plan Comments:         Anesthesia Quick Evaluation

## 2015-07-17 NOTE — Transfer of Care (Signed)
Immediate Anesthesia Transfer of Care Note  Patient: Paula [name]  Procedure(s) Performed: Procedure(s): RIGHT BREAST WIDE EXCISION WITH SENTINEL NODE BIOPSY (Right)  Patient Location: PACU  Anesthesia Type:General  Level of Consciousness: sedated  Airway & Oxygen Therapy: Patient Spontanous Breathing and Patient connected to face mask oxygen  Post-op Assessment: Report given to RN and Post -op Vital signs reviewed and stable  Post vital signs: Reviewed and stable  Last Vitals: 110/62 80hr 98% 14r  99.3 Filed Vitals:   07/17/15 0831  BP: 118/78  Pulse: 72  Temp: 36.7 C  Resp: 18    Complications: No apparent anesthesia complications

## 2015-07-17 NOTE — H&P (Signed)
No change in clinical history or exam. Right breast cancer.  For wide excision, SLN biopsy.

## 2015-07-21 ENCOUNTER — Encounter: Payer: Self-pay | Admitting: General Surgery

## 2015-07-21 ENCOUNTER — Ambulatory Visit (INDEPENDENT_AMBULATORY_CARE_PROVIDER_SITE_OTHER): Payer: Managed Care, Other (non HMO) | Admitting: General Surgery

## 2015-07-21 VITALS — BP 130/78 | HR 76 | Resp 14 | Ht 63.0 in | Wt 221.0 lb

## 2015-07-21 DIAGNOSIS — C50911 Malignant neoplasm of unspecified site of right female breast: Secondary | ICD-10-CM

## 2015-07-21 LAB — SURGICAL PATHOLOGY

## 2015-07-21 NOTE — Progress Notes (Signed)
Patient ID: Paula [name], female   DOB: [birth year], 49 y.o.   MRN: [id number]  Chief Complaint  Patient presents with  . Routine Post Op    right lumpectomy    HPI Paula [name] is a 49 y.o. female. here today for her post op right lumpectomy done on 07/17/15. Patient states she is doing well. HPI  Past Medical History  Diagnosis Date  . STD (female)   . HPV in female 2016  . Dog bite of left lower leg 03/07/15  . Pneumonia 02/2015  . BRCA negative   . Breast cancer 05/28/15    pT1c,N1a:ER positive, PR positive, HER-2/neu not overexpressed    Past Surgical History  Procedure Laterality Date  . Colposcopy  05/28/15    High grade squamous intraepithelial lesion  . Breast surgery Right 05/28/15  . Wisdom tooth extraction    . Breast lumpectomy with sentinel lymph node biopsy Right 07/17/2015    Procedure: RIGHT BREAST WIDE EXCISION WITH SENTINEL NODE BIOPSY;  Surgeon: Robert Bellow, MD;  Location: [facility] ORS;  Service: General;  Laterality: Right;    Family History  Problem Relation Age of Onset  . Cancer Maternal Aunt 97    breast    Social History History  Substance Use Topics  . Smoking status: Current Every Day Smoker -- 1.00 packs/day for 36 years    Types: Cigarettes  . Smokeless tobacco: Never Used  . Alcohol Use: 0.0 oz/week    0 Standard drinks or equivalent per week     Comment: occ    Not on File  Current Outpatient Prescriptions  Medication Sig Dispense Refill  . HYDROcodone-acetaminophen (NORCO) 5-325 MG per tablet Take 1-2 tablets by mouth every 4 (four) hours as needed. 30 tablet 0   No current facility-administered medications for this visit.    Review of Systems Review of Systems  Constitutional: Negative.   Respiratory: Negative.   Cardiovascular: Negative.     Blood pressure 130/78, pulse 76, resp. rate 14, height _0  (1.6 m), weight 221 lb (100.245 kg), last menstrual period 07/04/2015.  Physical Exam Physical Exam   Constitutional: She is oriented to person, place, and time. She appears well-developed and well-nourished.  Eyes: Conjunctivae are normal. No scleral icterus.  Neck: Neck supple.  Cardiovascular: Normal rate, regular rhythm and normal heart sounds.   Pulmonary/Chest: Effort normal and breath sounds normal.    Full shoulder range of motion.  Lymphadenopathy:    She has no cervical adenopathy.  Neurological: She is alert and oriented to person, place, and time.  Skin: Skin is warm and dry.    Data Reviewed 2 of 3 sentinel nodes positive for micrometastatic disease. Clear margins.  Assessment    Covering well status post wide excision and sentinel node biopsy.    Plan    The patient may be a candidate for Mammoprint assessment based on the MINDACT trial results that did not show an advantage for adjuvant chemotherapy in patients with a low recurrence score. Based on the finding of 2 nodes with micrometastatic disease, she is a candidate for whole breast radiation.    Patient to return in one week.  Arrangements have been made for the patient to be evaluated by Dr. Oliva Bustard and Dr. Baruch Gouty at the [facility]. This will take place on 07-24-15 at 2 pm.  PCP:  Renella Cunas, Forest Gleason 07/22/2015, 7:02 AM

## 2015-07-21 NOTE — Patient Instructions (Addendum)
Patient to return in one week  Arrangements have been made for the patient to be evaluated by Dr. Oliva Bustard and Dr. Baruch Gouty at the [facility]. This will take place on 07-24-15 at 2 pm.

## 2015-07-22 ENCOUNTER — Encounter: Payer: Self-pay | Admitting: General Surgery

## 2015-07-24 ENCOUNTER — Inpatient Hospital Stay: Payer: Managed Care, Other (non HMO) | Attending: Oncology | Admitting: Oncology

## 2015-07-24 ENCOUNTER — Ambulatory Visit
Admission: RE | Admit: 2015-07-24 | Discharge: 2015-07-24 | Disposition: A | Discharge status: Home / Self Care | Payer: Managed Care, Other (non HMO) | Source: Ambulatory Visit | Attending: Radiation Oncology | Admitting: Radiation Oncology

## 2015-07-24 ENCOUNTER — Encounter: Payer: Self-pay | Admitting: Radiation Oncology

## 2015-07-24 VITALS — BP 118/81 | HR 81 | Temp 96.7°F | Resp 20 | Ht 63.0 in | Wt 221.1 lb

## 2015-07-24 DIAGNOSIS — Z510 Encounter for antineoplastic radiation therapy: Secondary | ICD-10-CM | POA: Insufficient documentation

## 2015-07-24 DIAGNOSIS — F17210 Nicotine dependence, cigarettes, uncomplicated: Secondary | ICD-10-CM | POA: Diagnosis not present

## 2015-07-24 DIAGNOSIS — E669 Obesity, unspecified: Secondary | ICD-10-CM | POA: Insufficient documentation

## 2015-07-24 DIAGNOSIS — C50911 Malignant neoplasm of unspecified site of right female breast: Secondary | ICD-10-CM

## 2015-07-24 DIAGNOSIS — A630 Anogenital (venereal) warts: Secondary | ICD-10-CM | POA: Insufficient documentation

## 2015-07-24 DIAGNOSIS — C779 Secondary and unspecified malignant neoplasm of lymph node, unspecified: Secondary | ICD-10-CM | POA: Insufficient documentation

## 2015-07-24 DIAGNOSIS — Z170 Estrogen receptor positive status [ER+]: Secondary | ICD-10-CM | POA: Insufficient documentation

## 2015-07-24 DIAGNOSIS — C50211 Malignant neoplasm of upper-inner quadrant of right female breast: Secondary | ICD-10-CM | POA: Insufficient documentation

## 2015-07-24 DIAGNOSIS — Z8701 Personal history of pneumonia (recurrent): Secondary | ICD-10-CM | POA: Insufficient documentation

## 2015-07-24 DIAGNOSIS — Z803 Family history of malignant neoplasm of breast: Secondary | ICD-10-CM | POA: Insufficient documentation

## 2015-07-24 NOTE — Consult Note (Signed)
Except an outstanding is perfect of Radiation Oncology NEW PATIENT EVALUATION  Name: Paula [name]  MRN: [id number]  Date:   07/24/2015     DOB: 24-Apr-1966   This 49 y.o. female patient presents to the clinic for initial evaluation of breast cancer stage IB (T1c N31mi M0) ER/PR positive HER-2/neu negative status post wide local excision and sentinel node biopsy.  REFERRING PHYSICIAN: Garald Balding*  CHIEF COMPLAINT:  Chief Complaint  Patient presents with  . Breast Cancer    Pt is here for initial consultation for breast cancer.      DIAGNOSIS: The encounter diagnosis was Malignant neoplasm of female breast, right.   PREVIOUS INVESTIGATIONS:  Mammograms and ultrasound reviewed Pathology reports reviewed Clinical notes reviewed Case presented at breast cancer conference  HPI: patient is a 49 year old female who presented with an abnormal mammogram of her left breast showing a 1.9 cm irregular spiculated mass located in the upper inner quadrant of the right breast at the 2:30 position. She also had another complex cyst measuring 2.3 cm within the right breast at the 10:00 position both were biopsiedand lesion at the 2:30 position was positive for invasive mammary carcinoma. Lesion at 10:00 position showed no evidence of malignancy. She then underwent a wide local excision and sentinel node biopsy.tumor measured 2.0 cm margins were close but negative for malignancy at 1 mm. She had 3 sentinel nodes removed 2 showed micrometastatic disease. Patient has done well postoperatively. Tumor was ER/PR positive HER-2/neu not overexpressed. Man apparent is being requested for decisions regarding systemic chemotherapy. She specifically denies breast tenderness cough or bone pain.  PLANNED TREATMENT REGIMEN: whole breast and peripheral lymphatic radiation plus or minus chemotherapy plus aromatase inhibitor therapy  PAST MEDICAL HISTORY:  has a past medical history of STD (female); HPV in  female (2016); Dog bite of left lower leg (03/07/15); Pneumonia (02/2015); BRCA negative; and Breast cancer (05/28/15).    PAST SURGICAL HISTORY:  Past Surgical History  Procedure Laterality Date  . Colposcopy  05/28/15    High grade squamous intraepithelial lesion  . Breast surgery Right 05/28/15  . Wisdom tooth extraction    . Breast lumpectomy with sentinel lymph node biopsy Right 07/17/2015    Procedure: RIGHT BREAST WIDE EXCISION WITH SENTINEL NODE BIOPSY;  Surgeon: Robert Bellow, MD;  Location: [facility] ORS;  Service: General;  Laterality: Right;    FAMILY HISTORY: family history includes Cancer (age of onset: 45) in her maternal aunt.  SOCIAL HISTORY:  reports that she has been smoking Cigarettes.  She has a 36 pack-year smoking history. She has never used smokeless tobacco. She reports that she drinks alcohol. She reports that she does not use illicit drugs.  ALLERGIES: Review of patient's allergies indicates not on file.  MEDICATIONS:  Current Outpatient Prescriptions  Medication Sig Dispense Refill  . HYDROcodone-acetaminophen (NORCO) 5-325 MG per tablet Take 1-2 tablets by mouth every 4 (four) hours as needed. 30 tablet 0   No current facility-administered medications for this encounter.    ECOG PERFORMANCE STATUS:  0 - Asymptomatic  REVIEW OF SYSTEMS:  Patient denies any weight loss, fatigue, weakness, fever, chills or night sweats. Patient denies any loss of vision, blurred vision. Patient denies any ringing  of the ears or hearing loss. No irregular heartbeat. Patient denies heart murmur or history of fainting. Patient denies any chest pain or pain radiating to her upper extremities. Patient denies any shortness of breath, difficulty breathing at night, cough or hemoptysis. Patient denies  any swelling in the lower legs. Patient denies any nausea vomiting, vomiting of blood, or coffee ground material in the vomitus. Patient denies any stomach pain. Patient states has had  normal bowel movements no significant constipation or diarrhea. Patient denies any dysuria, hematuria or significant nocturia. Patient denies any problems walking, swelling in the joints or loss of balance. Patient denies any skin changes, loss of hair or loss of weight. Patient denies any excessive worrying or anxiety or significant depression. Patient denies any problems with insomnia. Patient denies excessive thirst, polyuria, polydipsia. Patient denies any swollen glands, patient denies easy bruising or easy bleeding. Patient denies any recent infections, allergies or URI. Patient "s visual fields have not changed significantly in recent time.    PHYSICAL EXAM: BP 118/81 mmHg  Pulse 81  Temp(Src) 96.7 F (35.9 C)  Resp 20  Ht $R'5\' 3"'zf$  (1.6 m)  Wt 221 lb 1.9 oz (100.3 kg)  BMI 39.18 kg/m2  LMP 07/04/2015 (Exact Date) Well-developed slightly obese female in NAD. She status post wide local excision of the right breast. Still some areas of erythema surrounding her scar which would be normal for a week postop. She does have a rather fibrocystic feel to her breasts bilaterally no discrete nodularity or mass is identified. No axillary or supraclavicular adenopathy is noted. Well-developed well-nourished patient in NAD. HEENT reveals PERLA, EOMI, discs not visualized.  Oral cavity is clear. No oral mucosal lesions are identified. Neck is clear without evidence of cervical or supraclavicular adenopathy. Lungs are clear to A&P. Cardiac examination is essentially unremarkable with regular rate and rhythm without murmur rub or thrill. Abdomen is benign with no organomegaly or masses noted. Motor sensory and DTR levels are equal and symmetric in the upper and lower extremities. Cranial nerves II through XII are grossly intact. Proprioception is intact. No peripheral adenopathy or edema is identified. No motor or sensory levels are noted. Crude visual fields are within normal range.   LABORATORY DATA: surgical  pathology reports reviewed    RADIOLOGY RESULTS:mammograms and ultrasound reviewed   IMPRESSION: stage IB invasive mammary carcinoma the right breast in 49 year old female ER/PR positive HER-2/neu negative with man print pending for whole breast and peripheral lymphatic radiation.  PLAN: I have recommended whole breast and peripheral sciatic radiation based on the limited sentinel node biopsy in 2 nodes showing micrometastatic disease. Will await simulation to after manometry results are reviewed. She has an appointment this afternoon with medical oncology to discuss possible chemotherapy and aromatase inhibitor therapy. I have described the risks and benefits of radiation including skin reaction, fatigue, inclusion of some superficial lung, alteration of blood counts, and possibility of lymphedema of her right upper extremity. I've stressed the importance of exercising her right upper extremity during and after treatments. I have set up and ordered CT simulation in about 2 weeks' time and will have met him up for results prior to that reviewed. Should she be candidate for systemic chemotherapy we'll sequence our radiation after chemotherapy. Patient also be candidate for aromatase inhibitor therapy.  I would like to take this opportunity for allowing me to participate in the care of your patient.Armstead Peaks., MD

## 2015-07-25 ENCOUNTER — Encounter: Payer: Self-pay | Admitting: Oncology

## 2015-07-25 ENCOUNTER — Telehealth: Payer: Self-pay | Admitting: *Deleted

## 2015-07-25 NOTE — Progress Notes (Signed)
[place] @ [facility] Telephone:(336) [phone]  Fax:([phone]) [place]: 1966-06-18  MR#: [id number]  EQA#:[id number]  Patient Care Team: Gearldine Shown, DO as PCP - General (Family Medicine) Robert Bellow, MD (General Surgery) Gearldine Shown, DO as Referring Physician (Family Medicine)  CHIEF COMPLAINT:   21.  49 year old lady with a history of carcinoma of breast status post lumpectomy (right breast) T1 cN1 M0 estrogen and progesterone receptor positive HER-2/neu receptor negative stage II disease diagnosis in July of 2016 It is post lumpectomy and sentinel lymph node evaluation to positive lymph node macro metastases with disease.  2.  BRCA: No significant mutation observed.  (August of 2016) VISIT DIAGNOSIS:  Carcinoma of right breast  No history exists.    Oncology Flowsheet 07/17/2015  dexamethasone (DECADRON) IV -  ondansetron (ZOFRAN) IV -    INTERVAL HISTORY:  patient is a 49 year old  Moderately obese  female who presented with an abnormal mammogram of her left breast showing a 1.9 cm irregular spiculated mass located in the upper inner quadrant of the right breast at the 2:30 position. She also had another complex cyst measuring 2.3 cm within the right breast at the 10:00 position both were biopsiedand lesion at the 2:30 position was positive for invasive mammary carcinoma. Lesion at 10:00 position showed no evidence of malignancy. She then underwent a wide local excision and sentinel node biopsy.tumor measured 2.0 cm margins were close but negative for malignancy at 1 mm. She had 3 sentinel nodes removed 2 showed macro metastatic disease. Patient has done well postoperatively. Tumor was ER/PR positive HER-2/neu not overexpressed.  Consult is being requested for decisions regarding systemic chemotherapy. She specifically denies breast tenderness As per HPI. Otherwise, a complete review of systems is negatve.  PAST  MEDICAL HISTORY: Past Medical History  Diagnosis Date  . STD (female)   . HPV in female 2016  . Dog bite of left lower leg 03/07/15  . Pneumonia 02/2015  . BRCA negative   . Breast cancer 05/28/15    pT1c,N1a:ER positive, PR positive, HER-2/neu not overexpressed    PAST SURGICAL HISTORY: Past Surgical History  Procedure Laterality Date  . Colposcopy  05/28/15    High grade squamous intraepithelial lesion  . Breast surgery Right 05/28/15  . Wisdom tooth extraction    . Breast lumpectomy with sentinel lymph node biopsy Right 07/17/2015    Procedure: RIGHT BREAST WIDE EXCISION WITH SENTINEL NODE BIOPSY;  Surgeon: Robert Bellow, MD;  Location: [facility] ORS;  Service: General;  Laterality: Right;    FAMILY HISTORY Family History  Problem Relation Age of Onset  . Cancer Maternal Aunt 50    breast        ADVANCED DIRECTIVES:  Patient does not have any living will or healthcare power of attorney.  Information was given .  Available resources had been discussed.  We will follow-up on subsequent appointments regarding this issue  HEALTH MAINTENANCE: History  Substance Use Topics  . Smoking status: Current Every Day Smoker -- 1.00 packs/day for 36 years    Types: Cigarettes  . Smokeless tobacco: Never Used  . Alcohol Use: 0.0 oz/week    0 Standard drinks or equivalent per week     Comment: occ       Not on File  Current Outpatient Prescriptions  Medication Sig Dispense Refill  . HYDROcodone-acetaminophen (NORCO) 5-325 MG per tablet Take 1-2 tablets by mouth every 4 (four) hours as needed. [place]  tablet 0   No current facility-administered medications for this visit.    OBJECTIVE: PHYSICAL EXAM: Gen. status: Patient is moderately  Obese with a BMI of 39.  Slightly apprehensive but not any acute distress  Head exam was generally normal. There was no scleral icterus or corneal arcus. Mucous membranes were moist. Examination of the skin revealed no evidence of significant  rashes, suspicious appearing nevi or other concerning lesions. Lymphatic system: Supraclavicular, cervical, axillary, inguinal lymph nodes are not palpable  Cardiac exam revealed the PMI to be normally situated and sized. The rhythm was regular and no extrasystoles were noted during several minutes of auscultation. The first and second heart sounds were normal and physiologic splitting of the second heart sound was noted. There were no murmurs, rubs, clicks, or gallops Examination of the chest was unremarkable. There were no bony deformities, no asymmetry, and no other abnormalities. Examination of both breast: Right breast lumpectomy slight redness.  Wound is healing.  Left breast free of masses Abdominal exam revealed normal bowel sounds. The abdomen was soft, non-tender, and without masses, organomegaly, or appreciable enlargement of the abdominal aorta. Neurologically, the patient was awake, alert, and oriented to person, place and time. There were no obvious focal neurologic abnormalities. Musculoskeletal system without any significant problem Your extremity trace edema.  There were no vitals filed for this visit.   There is no weight on file to calculate BMI.    ECOG FS:1 - Symptomatic but completely ambulatory  LAB RESULts June 03, 2015    Ref Range 49mo ago    CA 27.29 0.0 - 38.6 U/mL 15.2         Pathology has been reviewed.   STUDIES: Nm Sentinel Node Inj-no Rpt (breast)  07/17/2015   CLINICAL DATA: breast cancer   Sulfur colloid was injected intradermally by the nuclear medicine  technologist for breast cancer sentinel node localization.     ASSESSMENT:  1  Carcinoma of right breast T1 cN1 M0 tumor stage II estrogen receptor positive progesterone receptor positive HER-2/neu receptor negative. Status post lumpectomy and sentinel lymph node evaluation   PLAN:   Patient is BRCA negative.  Patient is moderately obese with a BMI of 39. I had prolonged discussion with  patient.  Patient has been evaluated for local radiation therapy as well as here for systemic adjuvant therapy we went over the pros and clones of adjuvant chemotherapy.  Available new way of testing whether patient would benefit from adjuvant chemotherapy or would need just anti-hormonal treatment. Such approach is still considered investigational and has not been incorporated inNCCN guidelines that has been discussed.  NCCN  guidelines at present time suggest adjuvant chemotherapy. Patient is not very eager to consider chemotherapy would like to get a MAMOPRINT  assessment before making decisions Patient's tissue has been sent for [facility] patient would be contacted as soon as the results are available.  Radiation therapy would be put on hold.  Patient expressed understanding and was in agreement with this plan. She also understands that She can call clinic at any time with any questions, concerns, or complaints.    No matching staging information was found for the patient.  Forest Gleason, MD   07/25/2015 8:32 AM

## 2015-07-25 NOTE — Telephone Encounter (Signed)
Needs fax number and also wants to know if patient was seen in clinic.  Information given.

## 2015-07-29 ENCOUNTER — Encounter: Payer: Self-pay | Admitting: General Surgery

## 2015-07-29 ENCOUNTER — Ambulatory Visit: Payer: Managed Care, Other (non HMO) | Admitting: General Surgery

## 2015-07-29 ENCOUNTER — Other Ambulatory Visit: Payer: Managed Care, Other (non HMO)

## 2015-07-29 VITALS — BP 140/82 | HR 84 | Resp 16 | Ht 63.5 in | Wt 221.0 lb

## 2015-07-29 DIAGNOSIS — T792XXD Traumatic secondary and recurrent hemorrhage and seroma, subsequent encounter: Secondary | ICD-10-CM

## 2015-07-29 DIAGNOSIS — T814XXD Infection following a procedure, subsequent encounter: Secondary | ICD-10-CM

## 2015-07-29 DIAGNOSIS — IMO0001 Reserved for inherently not codable concepts without codable children: Secondary | ICD-10-CM

## 2015-07-29 DIAGNOSIS — C50211 Malignant neoplasm of upper-inner quadrant of right female breast: Secondary | ICD-10-CM

## 2015-07-29 NOTE — Progress Notes (Addendum)
Patient ID: Paula [name], female   DOB: Nov 18, 1966, 49 y.o.   MRN: [id number]  Chief Complaint  Patient presents with  . Routine Post Op    right breast lumpectomy on 07-17-15    HPI Paula [name] is a 49 y.o. female.  Here today for postoperative visit right breast lumpectomy with SN biopsy on 07-17-15. She states the area is still red but has improved.   HPI  Past Medical History  Diagnosis Date  . STD (female)   . HPV in female 2016  . Dog bite of left lower leg 03/07/15  . Pneumonia 02/2015  . BRCA negative 2016  . Breast cancer 05/28/15    pT1c,N1a:2/3 SLN positive for macrometastisi,ER positive, PR positive, HER-2/neu not overexpressed    Past Surgical History  Procedure Laterality Date  . Colposcopy  05/28/15    High grade squamous intraepithelial lesion  . Breast surgery Right 05/28/15  . Wisdom tooth extraction    . Breast lumpectomy with sentinel lymph node biopsy Right 07/17/2015    Procedure: RIGHT BREAST WIDE EXCISION WITH SENTINEL NODE BIOPSY;  Surgeon: Robert Bellow, MD;  Location: [facility] ORS;  Service: General;  Laterality: Right;    Family History  Problem Relation Age of Onset  . Cancer Maternal Aunt 82    breast    Social History Social History  Substance Use Topics  . Smoking status: Current Every Day Smoker -- 1.00 packs/day for 36 years    Types: Cigarettes  . Smokeless tobacco: Never Used  . Alcohol Use: 0.0 oz/week    0 Standard drinks or equivalent per week     Comment: occ    No Known Allergies  Current Outpatient Prescriptions  Medication Sig Dispense Refill  . HYDROcodone-acetaminophen (NORCO) 5-325 MG per tablet Take 1-2 tablets by mouth every 4 (four) hours as needed. 30 tablet 0   No current facility-administered medications for this visit.    Review of Systems Review of Systems  Constitutional: Negative.   Respiratory: Negative.   Cardiovascular: Negative.     Blood pressure 140/82, pulse 84, resp. rate 16, height  5' 3.5" (1.613 m), weight 221 lb (100.245 kg), last menstrual period 07/05/2015.  Physical Exam Physical Exam  Constitutional: She is oriented to person, place, and time. She appears well-developed and well-nourished.  Pulmonary/Chest:    Right breast redness at the incision site.  Neurological: She is alert and oriented to person, place, and time.  Skin: Skin is warm and dry.   Drained 15 ml of fluid Data Reviewed Ultrasound examination of the wide excision site was undertaken to determine if a significant seroma/hematoma/abscess was present. There was a 0.7 x 3.6 x 6.0 cm superficially located fluid collection. The patient was amenable to aspiration. This was completed after chlor prep skin cleansing, 1 mL 1% Xylocaine and was completed with a 20 mL needle. 15 mL of old hematoma fluid without evidence of gross pus was noted.  Mammoprint testing has been requested by medical oncology prior to initiation of chemotherapy.  Assessment    Possible infected hematoma of the right breast wide excision site.    Plan    The patient was asked to contact the office of the area of erythema extended. We'll hold a robotic therapy pending culture results based on the gross appearance of the fluid aspirated today.     Patient to return in one week . If the redness gets worse call the office. PCP:  Rosalita Chessman 08/01/2015,  6:31 AM

## 2015-07-29 NOTE — Patient Instructions (Addendum)
Patient to return in one week . If the redness gets worse call the office.

## 2015-07-30 ENCOUNTER — Telehealth: Payer: Self-pay | Admitting: *Deleted

## 2015-07-30 NOTE — Telephone Encounter (Signed)
Patient is calling because she is wanting a back to work note faxed to her work with attention [employer] Fax Number:[phone]. She was seen yesterday 07/29/15 by Dr.Byrnett

## 2015-07-30 NOTE — Telephone Encounter (Signed)
Done, pt aware.

## 2015-08-01 ENCOUNTER — Telehealth: Payer: Self-pay | Admitting: *Deleted

## 2015-08-01 ENCOUNTER — Encounter: Payer: Self-pay | Admitting: General Surgery

## 2015-08-01 DIAGNOSIS — IMO0002 Reserved for concepts with insufficient information to code with codable children: Secondary | ICD-10-CM | POA: Insufficient documentation

## 2015-08-01 NOTE — Telephone Encounter (Signed)
-----   Message from Robert Bellow, MD sent at 08/01/2015  6:34 AM EDT ----- Please track down the culture result from Tuesday and contact patient to be sure the area of redness is not extended. If the culture is positively me know so we can start her antibiotics. Thank you

## 2015-08-01 NOTE — Telephone Encounter (Signed)
Patient states the redness has improved and doing much better.

## 2015-08-01 NOTE — Telephone Encounter (Signed)
Talked to lab this morning at 8:30am.  She states the aerobic showed mix skin flora and they are still waiting on the Anaerobic to grow.( up to six days)

## 2015-08-03 LAB — ANAEROBIC AND AEROBIC CULTURE

## 2015-08-05 ENCOUNTER — Other Ambulatory Visit: Payer: Managed Care, Other (non HMO)

## 2015-08-05 ENCOUNTER — Ambulatory Visit (INDEPENDENT_AMBULATORY_CARE_PROVIDER_SITE_OTHER): Payer: Managed Care, Other (non HMO) | Admitting: General Surgery

## 2015-08-05 ENCOUNTER — Encounter: Payer: Self-pay | Admitting: General Surgery

## 2015-08-05 VITALS — BP 106/64 | HR 86 | Resp 16 | Ht 63.5 in | Wt 226.0 lb

## 2015-08-05 DIAGNOSIS — IMO0001 Reserved for inherently not codable concepts without codable children: Secondary | ICD-10-CM

## 2015-08-05 DIAGNOSIS — T792XXD Traumatic secondary and recurrent hemorrhage and seroma, subsequent encounter: Secondary | ICD-10-CM

## 2015-08-05 DIAGNOSIS — T814XXD Infection following a procedure, subsequent encounter: Secondary | ICD-10-CM

## 2015-08-05 DIAGNOSIS — C50211 Malignant neoplasm of upper-inner quadrant of right female breast: Secondary | ICD-10-CM

## 2015-08-05 MED ORDER — SULFAMETHOXAZOLE-TRIMETHOPRIM 800-160 MG PO TABS: 1.0000 | ORAL_TABLET | Freq: Two times a day (BID) | ORAL | Status: DC

## 2015-08-05 NOTE — Progress Notes (Signed)
Patient ID: Paula [name], female   DOB: 1966-09-27, 49 y.o.   MRN: [id number]  Chief Complaint  Patient presents with  . Routine Post Op    HPI Paula [name] is a 49 y.o. female.  Here today for her postoperative visit, right breast lumpectomy on 07-17-15. She states she is doing well. She started work yesterday and she noticed some drainage form the incision last night.  HPI  Past Medical History  Diagnosis Date  . STD (female)   . HPV in female 2016  . Dog bite of left lower leg 03/07/15  . Pneumonia 02/2015  . BRCA negative 2016  . Breast cancer 05/28/15    pT1c,N1a:2/3 SLN positive for macrometastisi,ER positive, PR positive, HER-2/neu not overexpressed    Past Surgical History  Procedure Laterality Date  . Colposcopy  05/28/15    High grade squamous intraepithelial lesion  . Breast surgery Right 05/28/15  . Wisdom tooth extraction    . Breast lumpectomy with sentinel lymph node biopsy Right 07/17/2015    Procedure: RIGHT BREAST WIDE EXCISION WITH SENTINEL NODE BIOPSY;  Surgeon: Robert Bellow, MD;  Location: [facility] ORS;  Service: General;  Laterality: Right;    Family History  Problem Relation Age of Onset  . Cancer Maternal Aunt 25    breast    Social History Social History  Substance Use Topics  . Smoking status: Current Every Day Smoker -- 1.00 packs/day for 36 years    Types: Cigarettes  . Smokeless tobacco: Never Used  . Alcohol Use: 0.0 oz/week    0 Standard drinks or equivalent per week     Comment: occ    No Known Allergies  Current Outpatient Prescriptions  Medication Sig Dispense Refill  . HYDROcodone-acetaminophen (NORCO) 5-325 MG per tablet Take 1-2 tablets by mouth every 4 (four) hours as needed. 30 tablet 0  . sulfamethoxazole-trimethoprim (BACTRIM DS,SEPTRA DS) 800-160 MG per tablet Take 1 tablet by mouth 2 (two) times daily. 20 tablet 0   No current facility-administered medications for this visit.    Review of Systems Review of  Systems  Constitutional: Negative.   Respiratory: Negative.   Cardiovascular: Negative.     Blood pressure 106/64, pulse 86, resp. rate 16, height 5' 3.5" (1.613 m), weight 226 lb (102.513 kg), last menstrual period 07/05/2015.  Physical Exam Physical Exam  Constitutional: She is oriented to person, place, and time. She appears well-developed and well-nourished.  HENT:  Mouth/Throat: Oropharynx is clear and moist.  Pulmonary/Chest:    Mild redness right breast incision.   Neurological: She is alert and oriented to person, place, and time.  Skin: Skin is warm and dry.  Psychiatric: Her behavior is normal.    Data Reviewed Ultrasound showed some residual fluid beneath the wide excision site and this was aspirated after chlor prep. 12 mL of fluid obtained. Serous in nature. Procedure well-tolerated.  Mixed skin flora.  Assessment    Resolving inflammatory process at the wide excision site, possible low-grade skin contaminant infection.    Plan    We will place the patient on Bactrim DS 1 by mouth twice a day for 10 days. She was asked to make use of probiotics to minimize GI discomfort. Local heat applications were again encouraged.  Were waiting on Mammoprint testing to determine whether she'll benefit from adjuvant chemotherapy.  She may proceed with planned breast simulation in preparation for upcoming radiation therapy.    Follow up 2 weeks. Probiotics and heating pad for comfort.  PCP:  Burgess Estelle 08/06/2015, 7:05 AM

## 2015-08-05 NOTE — Patient Instructions (Addendum)
The patient is aware to call back for any questions or concerns. Follow up 2 weeks. Probiotics and heating pad for comfort.

## 2015-08-07 ENCOUNTER — Ambulatory Visit
Admission: RE | Admit: 2015-08-07 | Discharge: 2015-08-07 | Disposition: A | Discharge status: Home / Self Care | Payer: Managed Care, Other (non HMO) | Source: Ambulatory Visit | Attending: Radiation Oncology | Admitting: Radiation Oncology

## 2015-08-07 ENCOUNTER — Ambulatory Visit: Payer: Managed Care, Other (non HMO) | Admitting: Oncology

## 2015-08-07 ENCOUNTER — Inpatient Hospital Stay (HOSPITAL_BASED_OUTPATIENT_CLINIC_OR_DEPARTMENT_OTHER): Payer: Managed Care, Other (non HMO) | Admitting: Oncology

## 2015-08-07 VITALS — BP 100/68 | HR 75 | Temp 97.8°F | Wt 225.5 lb

## 2015-08-07 DIAGNOSIS — A630 Anogenital (venereal) warts: Secondary | ICD-10-CM

## 2015-08-07 DIAGNOSIS — F17210 Nicotine dependence, cigarettes, uncomplicated: Secondary | ICD-10-CM

## 2015-08-07 DIAGNOSIS — C50211 Malignant neoplasm of upper-inner quadrant of right female breast: Secondary | ICD-10-CM

## 2015-08-07 DIAGNOSIS — C779 Secondary and unspecified malignant neoplasm of lymph node, unspecified: Secondary | ICD-10-CM | POA: Diagnosis not present

## 2015-08-07 DIAGNOSIS — Z170 Estrogen receptor positive status [ER+]: Secondary | ICD-10-CM | POA: Diagnosis not present

## 2015-08-07 DIAGNOSIS — Z510 Encounter for antineoplastic radiation therapy: Secondary | ICD-10-CM | POA: Diagnosis not present

## 2015-08-07 DIAGNOSIS — E669 Obesity, unspecified: Secondary | ICD-10-CM

## 2015-08-07 DIAGNOSIS — Z8701 Personal history of pneumonia (recurrent): Secondary | ICD-10-CM

## 2015-08-07 DIAGNOSIS — Z803 Family history of malignant neoplasm of breast: Secondary | ICD-10-CM | POA: Diagnosis not present

## 2015-08-07 NOTE — Progress Notes (Signed)
Patient does not have living will.  Currently smokes.  Here today for CT results.

## 2015-08-08 ENCOUNTER — Other Ambulatory Visit: Payer: Self-pay | Admitting: *Deleted

## 2015-08-08 DIAGNOSIS — C50911 Malignant neoplasm of unspecified site of right female breast: Secondary | ICD-10-CM

## 2015-08-12 DIAGNOSIS — C50211 Malignant neoplasm of upper-inner quadrant of right female breast: Secondary | ICD-10-CM | POA: Diagnosis not present

## 2015-08-14 ENCOUNTER — Ambulatory Visit
Admission: RE | Admit: 2015-08-14 | Discharge: 2015-08-14 | Disposition: A | Discharge status: Home / Self Care | Payer: Managed Care, Other (non HMO) | Source: Ambulatory Visit | Attending: Radiation Oncology | Admitting: Radiation Oncology

## 2015-08-14 ENCOUNTER — Encounter: Payer: Self-pay | Admitting: General Surgery

## 2015-08-14 ENCOUNTER — Other Ambulatory Visit: Payer: Managed Care, Other (non HMO)

## 2015-08-14 ENCOUNTER — Ambulatory Visit (INDEPENDENT_AMBULATORY_CARE_PROVIDER_SITE_OTHER): Payer: Managed Care, Other (non HMO) | Admitting: General Surgery

## 2015-08-14 VITALS — BP 118/72 | HR 62 | Resp 14 | Ht 63.5 in | Wt 222.0 lb

## 2015-08-14 DIAGNOSIS — C50911 Malignant neoplasm of unspecified site of right female breast: Secondary | ICD-10-CM

## 2015-08-14 DIAGNOSIS — N641 Fat necrosis of breast: Secondary | ICD-10-CM

## 2015-08-14 DIAGNOSIS — T792XXD Traumatic secondary and recurrent hemorrhage and seroma, subsequent encounter: Secondary | ICD-10-CM

## 2015-08-14 DIAGNOSIS — IMO0001 Reserved for inherently not codable concepts without codable children: Secondary | ICD-10-CM

## 2015-08-14 DIAGNOSIS — C50211 Malignant neoplasm of upper-inner quadrant of right female breast: Secondary | ICD-10-CM | POA: Diagnosis not present

## 2015-08-14 DIAGNOSIS — T814XXD Infection following a procedure, subsequent encounter: Secondary | ICD-10-CM

## 2015-08-14 NOTE — Progress Notes (Signed)
Patient ID: Paula [name], female   DOB: [birth year], 49 y.o.   MRN: [id number]  Chief Complaint  Patient presents with  . Follow-up    HPI Paula [name] is a 49 y.o. female.  She had called to say her incision was leaking. She does have 2 days on antibiotics left to take. Radiation is planned for next week.   HPI  Past Medical History  Diagnosis Date  . STD (female)   . HPV in female 2016  . Dog bite of left lower leg 03/07/15  . Pneumonia 02/2015  . BRCA negative 2016  . Breast cancer 05/28/15    pT1c,N1a:2/3 SLN positive for macrometastisi,ER positive, PR positive, HER-2/neu not overexpressed    Past Surgical History  Procedure Laterality Date  . Colposcopy  05/28/15    High grade squamous intraepithelial lesion  . Breast surgery Right 05/28/15  . Wisdom tooth extraction    . Breast lumpectomy with sentinel lymph node biopsy Right 07/17/2015    Procedure: RIGHT BREAST WIDE EXCISION WITH SENTINEL NODE BIOPSY;  Surgeon: Robert Bellow, MD;  Location: [facility] ORS;  Service: General;  Laterality: Right;    Family History  Problem Relation Age of Onset  . Cancer Maternal Aunt 33    breast    Social History Social History  Substance Use Topics  . Smoking status: Current Every Day Smoker -- 1.00 packs/day for 36 years    Types: Cigarettes  . Smokeless tobacco: Never Used  . Alcohol Use: 0.0 oz/week    0 Standard drinks or equivalent per week     Comment: occ    No Known Allergies  Current Outpatient Prescriptions  Medication Sig Dispense Refill  . sulfamethoxazole-trimethoprim (BACTRIM DS,SEPTRA DS) 800-160 MG per tablet Take 1 tablet by mouth 2 (two) times daily. 20 tablet 0   No current facility-administered medications for this visit.    Review of Systems Review of Systems  Constitutional: Negative.   Respiratory: Negative.   Cardiovascular: Negative.     Blood pressure 118/72, pulse 62, resp. rate 14, height 5' 3.5" (1.613 m), weight 222 lb (100.699  kg), last menstrual period 07/05/2015.  Physical Exam Physical Exam  Pulmonary/Chest:        Assessment    Superficial fat necrosis status post mastoplasty.    Plan    The patient is scheduled to start radiation therapy next week. I will speak with the radiation oncologist and propose a 1-2 week postponement.     PCP:  Rosalita Chessman 08/15/2015, 9:11 AM

## 2015-08-15 DIAGNOSIS — N641 Fat necrosis of breast: Secondary | ICD-10-CM | POA: Insufficient documentation

## 2015-08-16 ENCOUNTER — Encounter: Payer: Self-pay | Admitting: Oncology

## 2015-08-16 NOTE — Progress Notes (Signed)
[facility] [phone]  [phone]   INITIAL CONSULT  Kenzlie J Fagerstrom OB: 03/26/1966  MR#: [id number]  CSN#:[id number]  Patient Care Team: Gloria Patricia Santayana, DO as PCP - General (Family Medicine) Jeffrey W Byrnett, MD (General Surgery) Gloria Patricia Santayana, DO as Referring Physician (Family Medicine)  CHIEF COMPLAINT:   1.  49-year-old lady with a history of carcinoma of breast status post lumpectomy (right breast) T1 cN1 M0 estrogen and progesterone receptor positive HER-2/neu receptor negative stage II disease diagnosis in July of 2016 It is post lumpectomy and sentinel lymph node evaluation to positive lymph node macro metastases with disease.  2.  BRCA: No significant mutation observed.  (August of 2016). 3.  Multiple gene analysis by mammoprint shows low risk (August, 2016)\ 4.  Patient started radiation therapy.  (August, 2016) VISIT DIAGNOSIS:  Carcinoma of right breast  No history exists.    Oncology Flowsheet 07/17/2015  dexamethasone (DECADRON) IV -  ondansetron (ZOFRAN) IV -    INTERVAL HISTORY:  patient is a 49-year-old  Moderately obese  female who presented with an abnormal mammogram of her left breast showing a 1.9 cm irregular spiculated mass located in the upper inner quadrant of the right breast at the 2:30 position. She also had another complex cyst measuring 2.3 cm within the right breast at the 10:00 position both were biopsiedand lesion at the 2:30 position was positive for invasive mammary carcinoma. Lesion at 10:00 position showed no evidence of malignancy. She then underwent a wide local excision and sentinel node biopsy.tumor measured 2.0 cm margins were close but negative for malignancy at 1 mm. She had 3 sentinel nodes removed 2 showed macro metastatic disease. Patient has done well postoperatively. Tumor was ER/PR positive HER-2/neu not overexpressed.  Consult is being requested for decisions regarding systemic  chemotherapy. She specifically denies breast tenderness.  August, 2016) Patient is here to discuss the results of multiagent analysis which reveals low risk . We discussed standard therapy with chemotherapy versus recent information regarding low risk patient doing as good as chemotherapy plus anti-hormonal therapy versus only anti-hormonal therapy.   Patient is unwilling to go through chemotherapy at present time .  Will start radiation therapy As per HPI. Otherwise, a complete review of systems is negatve.  PAST MEDICAL HISTORY: Past Medical History  Diagnosis Date  . STD (female)   . HPV in female 2016  . Dog bite of left lower leg 03/07/15  . Pneumonia 02/2015  . BRCA negative 2016  . Breast cancer 05/28/15    pT1c,N1a:2/3 SLN positive for macrometastisi,ER positive, PR positive, HER-2/neu not overexpressed    PAST SURGICAL HISTORY: Past Surgical History  Procedure Laterality Date  . Colposcopy  05/28/15    High grade squamous intraepithelial lesion  . Breast surgery Right 05/28/15  . Wisdom tooth extraction    . Breast lumpectomy with sentinel lymph node biopsy Right 07/17/2015    Procedure: RIGHT BREAST WIDE EXCISION WITH SENTINEL NODE BIOPSY;  Surgeon: Jeffrey W Byrnett, MD;  Location: [facility] ORS;  Service: General;  Laterality: Right;    FAMILY HISTORY Family History  Problem Relation Age of Onset  . Cancer Maternal Aunt 50    breast        ADVANCED DIRECTIVES:  Patient does not have any living will or healthcare power of attorney.  Information was given .  Available resources had been discussed.  We will follow-up on subsequent appointments regarding this issue  HEALTH MAINTENANCE: Social History  Substance   Use Topics  . Smoking status: Current Every Day Smoker -- 1.00 packs/day for 36 years    Types: Cigarettes  . Smokeless tobacco: Never Used  . Alcohol Use: 0.0 oz/week    0 Standard drinks or equivalent per week     Comment: occ       No Known  Allergies  Current Outpatient Prescriptions  Medication Sig Dispense Refill  . sulfamethoxazole-trimethoprim (BACTRIM DS,SEPTRA DS) 800-160 MG per tablet Take 1 tablet by mouth 2 (two) times daily. 20 tablet 0   No current facility-administered medications for this visit.    OBJECTIVE: PHYSICAL EXAM: Gen. status: Patient is moderately  Obese with a BMI of 39.  Slightly apprehensive but not any acute distress  Head exam was generally normal. There was no scleral icterus or corneal arcus. Mucous membranes were moist. Examination of the skin revealed no evidence of significant rashes, suspicious appearing nevi or other concerning lesions. Lymphatic system: Supraclavicular, cervical, axillary, inguinal lymph nodes are not palpable  Cardiac exam revealed the PMI to be normally situated and sized. The rhythm was regular and no extrasystoles were noted during several minutes of auscultation. The first and second heart sounds were normal and physiologic splitting of the second heart sound was noted. There were no murmurs, rubs, clicks, or gallops Examination of the chest was unremarkable. There were no bony deformities, no asymmetry, and no other abnormalities. Examination of both breast: Right breast lumpectomy slight redness.  Wound is healing.  Left breast free of masses Abdominal exam revealed normal bowel sounds. The abdomen was soft, non-tender, and without masses, organomegaly, or appreciable enlargement of the abdominal aorta. Neurologically, the patient was awake, alert, and oriented to person, place and time. There were no obvious focal neurologic abnormalities. Musculoskeletal system without any significant problem Your extremity trace edema.  Filed Vitals:   08/07/15 1114  BP: 100/68  Pulse: 75  Temp: 97.8 F (36.6 C)     Body mass index is 39.31 kg/(m^2).    ECOG FS:1 - Symptomatic but completely ambulatory  LAB RESULts June 03, 2015    Ref Range 72moago    CA 27.29 0.0 -  38.6 U/mL 15.2         Pathology has been reviewed.   STUDIES: Nm Sentinel Node Inj-no Rpt (breast)  07/17/2015   CLINICAL DATA: breast cancer   Sulfur colloid was injected intradermally by the nuclear medicine  technologist for breast cancer sentinel node localization.     ASSESSMENT:  1  Carcinoma of right breast T1 cN1 M0 tumor stage II estrogen receptor positive progesterone receptor positive HER-2/neu receptor negative. Status post lumpectomy and sentinel lymph node evaluation   PLAN:   Patient is BRCA negative.  Patient is moderately obese with a BMI of 39. I had prolonged discussion with patient.  Patient has been evaluated for local radiation therapy as well as here for systemic adjuvant therapy we went over the pros and clones of adjuvant chemotherapy.  Available new way of testing whether patient would benefit from adjuvant chemotherapy or would need just anti-hormonal treatment. Such approach is still considered investigational and has not been incorporated inNCCN guidelines that has been discussed.  NCCN  guidelines at present time suggest adjuvant chemotherapy. Patient is not very eager to consider chemotherapy would like to get a MAMOPRINT  assessment before making decisions Patient is here to discuss the result of multiagent analysis which reveals patient to be in low risk.  We discussed pros and cons of adding  chemotherapy patient is not eager to go through chemotherapy at present time so we will start radiation therapy and will be reevaluated after radiation therapy is finished for possibility of anti-hormonal treatment. Total duration of visit was30minutes.  50% or more time was spent in counseling patient and family regarding prognosis and options of treatment and available resources.  Patient expressed understanding and was in agreement with this plan. She also understands that She can call clinic at any time with any questions, concerns, or complaints.    No  matching staging information was found for the patient.  Janak Choksi, MD   08/16/2015 7:13 AM     

## 2015-08-17 ENCOUNTER — Encounter: Payer: Self-pay | Admitting: Oncology

## 2015-08-18 ENCOUNTER — Ambulatory Visit: Payer: Managed Care, Other (non HMO)

## 2015-08-18 ENCOUNTER — Ambulatory Visit (INDEPENDENT_AMBULATORY_CARE_PROVIDER_SITE_OTHER): Payer: Managed Care, Other (non HMO) | Admitting: General Surgery

## 2015-08-18 ENCOUNTER — Encounter: Payer: Self-pay | Admitting: General Surgery

## 2015-08-18 ENCOUNTER — Ambulatory Visit
Admission: RE | Admit: 2015-08-18 | Discharge: 2015-08-18 | Disposition: A | Discharge status: Home / Self Care | Payer: Managed Care, Other (non HMO) | Source: Ambulatory Visit | Attending: Radiation Oncology | Admitting: Radiation Oncology

## 2015-08-18 VITALS — BP 132/76 | HR 74 | Resp 14 | Ht 63.5 in | Wt 221.0 lb

## 2015-08-18 DIAGNOSIS — T792XXD Traumatic secondary and recurrent hemorrhage and seroma, subsequent encounter: Secondary | ICD-10-CM

## 2015-08-18 DIAGNOSIS — IMO0001 Reserved for inherently not codable concepts without codable children: Secondary | ICD-10-CM

## 2015-08-18 DIAGNOSIS — T814XXD Infection following a procedure, subsequent encounter: Secondary | ICD-10-CM

## 2015-08-18 DIAGNOSIS — C50911 Malignant neoplasm of unspecified site of right female breast: Secondary | ICD-10-CM

## 2015-08-18 NOTE — Progress Notes (Signed)
Patient ID: Paula [name], female   DOB: October 01, 1966, 49 y.o.   MRN: [id number]  Chief Complaint  Patient presents with  . Follow-up    breast cancer    HPI Paula [name] is a 49 y.o. female here today for breast cancer follow up she had a right breast lumpectomy on 07-17-15. Patient states she had some drainage last week. She has a guaze over the area very minimal drainage. HPI  Past Medical History  Diagnosis Date  . STD (female)   . HPV in female 2016  . Dog bite of left lower leg 03/07/15  . Pneumonia 02/2015  . BRCA negative 2016  . Breast cancer 05/28/15    pT1c,N1a:2/3 SLN positive for macrometastisi,ER positive, PR positive, HER-2/neu not overexpressed    Past Surgical History  Procedure Laterality Date  . Colposcopy  05/28/15    High grade squamous intraepithelial lesion  . Breast surgery Right 05/28/15  . Wisdom tooth extraction    . Breast lumpectomy with sentinel lymph node biopsy Right 07/17/2015    Procedure: RIGHT BREAST WIDE EXCISION WITH SENTINEL NODE BIOPSY;  Surgeon: Robert Bellow, MD;  Location: [facility] ORS;  Service: General;  Laterality: Right;    Family History  Problem Relation Age of Onset  . Cancer Maternal Aunt 82    breast    Social History Social History  Substance Use Topics  . Smoking status: Current Every Day Smoker -- 1.00 packs/day for 36 years    Types: Cigarettes  . Smokeless tobacco: Never Used  . Alcohol Use: 0.0 oz/week    0 Standard drinks or equivalent per week     Comment: occ    No Known Allergies  No current outpatient prescriptions on file.   No current facility-administered medications for this visit.    Review of Systems Review of Systems  Constitutional: Negative.   Respiratory: Negative.   Cardiovascular: Negative.     Blood pressure 132/76, pulse 74, resp. rate 14, height 5' 3.5" (1.613 m), weight 221 lb (100.245 kg), last menstrual period 08/09/2015, SpO2 98 %.  Physical Exam Physical Exam   Constitutional: She is oriented to person, place, and time. She appears well-developed and well-nourished.  Pulmonary/Chest: Right breast exhibits no inverted nipple, no mass, no nipple discharge, no skin change and no tenderness.    Right breast healing   Neurological: She is alert and oriented to person, place, and time.  Skin: Skin is warm and dry.  Psychiatric: Her behavior is normal.    Data Reviewed Previous culture showed mixed skin flora.  Assessment    Resolution of fat necrosis.    Plan    The patient will proceed with planned radiation therapy.    She has been asked to continue local heat application.  Follow up in two weeks.   PCP: Dr. Curly Rim, Forest Gleason 08/18/2015, 11:09 AM

## 2015-08-19 ENCOUNTER — Ambulatory Visit
Admission: RE | Admit: 2015-08-19 | Discharge: 2015-08-19 | Disposition: A | Discharge status: Home / Self Care | Payer: Managed Care, Other (non HMO) | Source: Ambulatory Visit | Attending: Radiation Oncology | Admitting: Radiation Oncology

## 2015-08-19 ENCOUNTER — Ambulatory Visit: Payer: Managed Care, Other (non HMO)

## 2015-08-20 ENCOUNTER — Ambulatory Visit: Payer: Managed Care, Other (non HMO)

## 2015-08-20 ENCOUNTER — Ambulatory Visit
Admission: RE | Admit: 2015-08-20 | Discharge: 2015-08-20 | Disposition: A | Discharge status: Home / Self Care | Payer: Managed Care, Other (non HMO) | Source: Ambulatory Visit | Attending: Radiation Oncology | Admitting: Radiation Oncology

## 2015-08-21 ENCOUNTER — Ambulatory Visit
Admission: RE | Admit: 2015-08-21 | Discharge: 2015-08-21 | Disposition: A | Discharge status: Home / Self Care | Payer: Managed Care, Other (non HMO) | Source: Ambulatory Visit | Attending: Radiation Oncology | Admitting: Radiation Oncology

## 2015-08-21 DIAGNOSIS — C50211 Malignant neoplasm of upper-inner quadrant of right female breast: Secondary | ICD-10-CM | POA: Diagnosis not present

## 2015-08-22 ENCOUNTER — Ambulatory Visit
Admission: RE | Admit: 2015-08-22 | Discharge: 2015-08-22 | Disposition: A | Discharge status: Home / Self Care | Payer: Managed Care, Other (non HMO) | Source: Ambulatory Visit | Attending: Radiation Oncology | Admitting: Radiation Oncology

## 2015-08-22 DIAGNOSIS — C50211 Malignant neoplasm of upper-inner quadrant of right female breast: Secondary | ICD-10-CM | POA: Diagnosis not present

## 2015-08-26 ENCOUNTER — Ambulatory Visit
Admission: RE | Admit: 2015-08-26 | Discharge: 2015-08-26 | Disposition: A | Discharge status: Home / Self Care | Payer: Managed Care, Other (non HMO) | Source: Ambulatory Visit | Attending: Radiation Oncology | Admitting: Radiation Oncology

## 2015-08-26 DIAGNOSIS — C50211 Malignant neoplasm of upper-inner quadrant of right female breast: Secondary | ICD-10-CM | POA: Diagnosis not present

## 2015-08-27 ENCOUNTER — Ambulatory Visit
Admission: RE | Admit: 2015-08-27 | Discharge: 2015-08-27 | Disposition: A | Discharge status: Home / Self Care | Payer: Managed Care, Other (non HMO) | Source: Ambulatory Visit | Attending: Radiation Oncology | Admitting: Radiation Oncology

## 2015-08-27 ENCOUNTER — Inpatient Hospital Stay: Payer: Managed Care, Other (non HMO) | Attending: Oncology

## 2015-08-27 DIAGNOSIS — D869 Sarcoidosis, unspecified: Secondary | ICD-10-CM | POA: Diagnosis not present

## 2015-08-27 DIAGNOSIS — F17210 Nicotine dependence, cigarettes, uncomplicated: Secondary | ICD-10-CM | POA: Diagnosis not present

## 2015-08-27 DIAGNOSIS — E669 Obesity, unspecified: Secondary | ICD-10-CM | POA: Insufficient documentation

## 2015-08-27 DIAGNOSIS — C50911 Malignant neoplasm of unspecified site of right female breast: Secondary | ICD-10-CM

## 2015-08-27 DIAGNOSIS — A630 Anogenital (venereal) warts: Secondary | ICD-10-CM | POA: Insufficient documentation

## 2015-08-27 DIAGNOSIS — C50211 Malignant neoplasm of upper-inner quadrant of right female breast: Secondary | ICD-10-CM | POA: Diagnosis not present

## 2015-08-27 DIAGNOSIS — Z170 Estrogen receptor positive status [ER+]: Secondary | ICD-10-CM | POA: Diagnosis not present

## 2015-08-27 DIAGNOSIS — Z803 Family history of malignant neoplasm of breast: Secondary | ICD-10-CM | POA: Insufficient documentation

## 2015-08-27 DIAGNOSIS — Z8701 Personal history of pneumonia (recurrent): Secondary | ICD-10-CM | POA: Diagnosis not present

## 2015-08-27 LAB — CBC
HCT: 40 % (ref 35.0–47.0)
Hemoglobin: 13.4 g/dL (ref 12.0–16.0)
MCH: 29 pg (ref 26.0–34.0)
MCHC: 33.5 g/dL (ref 32.0–36.0)
MCV: 86.7 fL (ref 80.0–100.0)
PLATELETS: 259 10*3/uL (ref 150–440)
RBC: 4.61 MIL/uL (ref 3.80–5.20)
RDW: 15.9 % — AB (ref 11.5–14.5)
WBC: 7.4 10*3/uL (ref 3.6–11.0)

## 2015-08-28 ENCOUNTER — Ambulatory Visit
Admission: RE | Admit: 2015-08-28 | Discharge: 2015-08-28 | Disposition: A | Discharge status: Home / Self Care | Payer: Managed Care, Other (non HMO) | Source: Ambulatory Visit | Attending: Radiation Oncology | Admitting: Radiation Oncology

## 2015-08-28 DIAGNOSIS — C50211 Malignant neoplasm of upper-inner quadrant of right female breast: Secondary | ICD-10-CM | POA: Diagnosis not present

## 2015-08-29 ENCOUNTER — Ambulatory Visit
Admission: RE | Admit: 2015-08-29 | Discharge: 2015-08-29 | Disposition: A | Discharge status: Home / Self Care | Payer: Managed Care, Other (non HMO) | Source: Ambulatory Visit | Attending: Radiation Oncology | Admitting: Radiation Oncology

## 2015-08-29 DIAGNOSIS — C50211 Malignant neoplasm of upper-inner quadrant of right female breast: Secondary | ICD-10-CM | POA: Diagnosis not present

## 2015-09-01 ENCOUNTER — Ambulatory Visit
Admission: RE | Admit: 2015-09-01 | Discharge: 2015-09-01 | Disposition: A | Discharge status: Home / Self Care | Payer: Managed Care, Other (non HMO) | Source: Ambulatory Visit | Attending: Radiation Oncology | Admitting: Radiation Oncology

## 2015-09-01 ENCOUNTER — Encounter: Payer: Self-pay | Admitting: General Surgery

## 2015-09-01 ENCOUNTER — Ambulatory Visit (INDEPENDENT_AMBULATORY_CARE_PROVIDER_SITE_OTHER): Payer: Managed Care, Other (non HMO) | Admitting: General Surgery

## 2015-09-01 VITALS — BP 132/74 | HR 76 | Resp 14 | Ht 63.0 in | Wt 222.0 lb

## 2015-09-01 DIAGNOSIS — C50211 Malignant neoplasm of upper-inner quadrant of right female breast: Secondary | ICD-10-CM | POA: Diagnosis not present

## 2015-09-01 DIAGNOSIS — C50911 Malignant neoplasm of unspecified site of right female breast: Secondary | ICD-10-CM

## 2015-09-01 NOTE — Progress Notes (Signed)
Patient ID: Paula [name], female   DOB: 08/08/1966, 49 y.o.   MRN: [id number]  Chief Complaint  Patient presents with  . Follow-up    breast excision    HPI Paula [name] is a 49 y.o. female  here today for breast cancer follow up she had a right breast lumpectomy on 07-17-15. States she is doing well, she had a treatment this morning at the cancer center.  HPI  Past Medical History  Diagnosis Date  . STD (female)   . HPV in female 2016  . Dog bite of left lower leg 03/07/15  . Pneumonia 02/2015  . BRCA negative 2016  . Breast cancer 05/28/15    pT1c,N1a:2/3 SLN positive for macrometastisi,ER positive, PR positive, HER-2/neu not overexpressed    Past Surgical History  Procedure Laterality Date  . Colposcopy  05/28/15    High grade squamous intraepithelial lesion  . Breast surgery Right 05/28/15  . Wisdom tooth extraction    . Breast lumpectomy with sentinel lymph node biopsy Right 07/17/2015    Procedure: RIGHT BREAST WIDE EXCISION WITH SENTINEL NODE BIOPSY;  Surgeon: Jeffrey W Byrnett, MD;  Location: [facility] ORS;  Service: General;  Laterality: Right;    Family History  Problem Relation Age of Onset  . Cancer Maternal Aunt 50    breast    Social History Social History  Substance Use Topics  . Smoking status: Current Every Day Smoker -- 1.00 packs/day for 36 years    Types: Cigarettes  . Smokeless tobacco: Never Used  . Alcohol Use: 0.0 oz/week    0 Standard drinks or equivalent per week     Comment: occ    No Known Allergies  No current outpatient prescriptions on file.   No current facility-administered medications for this visit.    Review of Systems Review of Systems  Constitutional: Negative.   Respiratory: Negative.   Cardiovascular: Negative.     Blood pressure 132/74, pulse 76, resp. rate 14, height 5' 3" (1.6 m), weight 222 lb (100.699 kg), last menstrual period 08/09/2015.  Physical Exam Physical Exam  Constitutional: She is oriented to  person, place, and time. She appears well-developed and well-nourished.  Eyes: Conjunctivae are normal. No scleral icterus.  Neck: Neck supple.  Cardiovascular: Normal rate, regular rhythm and normal heart sounds.   Pulmonary/Chest: Right breast exhibits no inverted nipple, no mass, no nipple discharge, no skin change and no tenderness. Left breast exhibits no inverted nipple, no mass, no nipple discharge, no skin change and no tenderness.    Thickening in right breast at 3 o'clcok   Lymphadenopathy:    She has no cervical adenopathy.  Neurological: She is alert and oriented to person, place, and time.  Skin: Skin is warm and dry.  Psychiatric: Her behavior is normal.      Assessment    Doing well status post wide excision. All breast radiation initiated.    Plan    We'll plan for a follow-up examination in 3 months.       PCP: Santayana   Byrnett, Jeffrey W 09/01/2015, 9:21 PM    

## 2015-09-01 NOTE — Patient Instructions (Signed)
Plan to return in 3 months for a follow up. Call with any changes or concerns.

## 2015-09-02 ENCOUNTER — Ambulatory Visit
Admission: RE | Admit: 2015-09-02 | Discharge: 2015-09-02 | Disposition: A | Discharge status: Home / Self Care | Payer: Managed Care, Other (non HMO) | Source: Ambulatory Visit | Attending: Radiation Oncology | Admitting: Radiation Oncology

## 2015-09-02 DIAGNOSIS — C50211 Malignant neoplasm of upper-inner quadrant of right female breast: Secondary | ICD-10-CM | POA: Diagnosis not present

## 2015-09-03 ENCOUNTER — Ambulatory Visit
Admission: RE | Admit: 2015-09-03 | Discharge: 2015-09-03 | Disposition: A | Discharge status: Home / Self Care | Payer: Managed Care, Other (non HMO) | Source: Ambulatory Visit | Attending: Radiation Oncology | Admitting: Radiation Oncology

## 2015-09-03 DIAGNOSIS — C50211 Malignant neoplasm of upper-inner quadrant of right female breast: Secondary | ICD-10-CM | POA: Diagnosis not present

## 2015-09-04 ENCOUNTER — Ambulatory Visit
Admission: RE | Admit: 2015-09-04 | Discharge: 2015-09-04 | Disposition: A | Discharge status: Home / Self Care | Payer: Managed Care, Other (non HMO) | Source: Ambulatory Visit | Attending: Radiation Oncology | Admitting: Radiation Oncology

## 2015-09-04 DIAGNOSIS — C50211 Malignant neoplasm of upper-inner quadrant of right female breast: Secondary | ICD-10-CM | POA: Diagnosis not present

## 2015-09-05 ENCOUNTER — Ambulatory Visit
Admission: RE | Admit: 2015-09-05 | Discharge: 2015-09-05 | Disposition: A | Discharge status: Home / Self Care | Payer: Managed Care, Other (non HMO) | Source: Ambulatory Visit | Attending: Radiation Oncology | Admitting: Radiation Oncology

## 2015-09-05 DIAGNOSIS — C50211 Malignant neoplasm of upper-inner quadrant of right female breast: Secondary | ICD-10-CM | POA: Diagnosis not present

## 2015-09-08 ENCOUNTER — Ambulatory Visit
Admission: RE | Admit: 2015-09-08 | Discharge: 2015-09-08 | Disposition: A | Discharge status: Home / Self Care | Payer: Managed Care, Other (non HMO) | Source: Ambulatory Visit | Attending: Radiation Oncology | Admitting: Radiation Oncology

## 2015-09-08 DIAGNOSIS — C50211 Malignant neoplasm of upper-inner quadrant of right female breast: Secondary | ICD-10-CM | POA: Diagnosis not present

## 2015-09-09 ENCOUNTER — Ambulatory Visit
Admission: RE | Admit: 2015-09-09 | Discharge: 2015-09-09 | Disposition: A | Discharge status: Home / Self Care | Payer: Managed Care, Other (non HMO) | Source: Ambulatory Visit | Attending: Radiation Oncology | Admitting: Radiation Oncology

## 2015-09-09 DIAGNOSIS — C50211 Malignant neoplasm of upper-inner quadrant of right female breast: Secondary | ICD-10-CM | POA: Diagnosis not present

## 2015-09-10 ENCOUNTER — Inpatient Hospital Stay: Payer: Managed Care, Other (non HMO)

## 2015-09-10 ENCOUNTER — Ambulatory Visit
Admission: RE | Admit: 2015-09-10 | Discharge: 2015-09-10 | Disposition: A | Discharge status: Home / Self Care | Payer: Managed Care, Other (non HMO) | Source: Ambulatory Visit | Attending: Radiation Oncology | Admitting: Radiation Oncology

## 2015-09-10 ENCOUNTER — Other Ambulatory Visit: Payer: Self-pay | Admitting: *Deleted

## 2015-09-10 DIAGNOSIS — C50211 Malignant neoplasm of upper-inner quadrant of right female breast: Secondary | ICD-10-CM

## 2015-09-10 LAB — CBC WITH DIFFERENTIAL/PLATELET
BASOS ABS: 0 10*3/uL (ref 0–0.1)
Basophils Relative: 1 %
EOS PCT: 2 %
Eosinophils Absolute: 0.1 10*3/uL (ref 0–0.7)
HCT: 39.9 % (ref 35.0–47.0)
Hemoglobin: 13.2 g/dL (ref 12.0–16.0)
LYMPHS PCT: 25 %
Lymphs Abs: 1.6 10*3/uL (ref 1.0–3.6)
MCH: 29.3 pg (ref 26.0–34.0)
MCHC: 33.1 g/dL (ref 32.0–36.0)
MCV: 88.4 fL (ref 80.0–100.0)
Monocytes Absolute: 0.5 10*3/uL (ref 0.2–0.9)
Monocytes Relative: 7 %
Neutro Abs: 4.2 10*3/uL (ref 1.4–6.5)
Neutrophils Relative %: 65 %
PLATELETS: 228 10*3/uL (ref 150–440)
RBC: 4.51 MIL/uL (ref 3.80–5.20)
RDW: 16.1 % — ABNORMAL HIGH (ref 11.5–14.5)
WBC: 6.4 10*3/uL (ref 3.6–11.0)

## 2015-09-10 LAB — COMPREHENSIVE METABOLIC PANEL
ALT: 21 U/L (ref 14–54)
AST: 26 U/L (ref 15–41)
Albumin: 3.5 g/dL (ref 3.5–5.0)
Alkaline Phosphatase: 71 U/L (ref 38–126)
Anion gap: 6 (ref 5–15)
BUN: 9 mg/dL (ref 6–20)
CHLORIDE: 106 mmol/L (ref 101–111)
CO2: 25 mmol/L (ref 22–32)
CREATININE: 0.57 mg/dL (ref 0.44–1.00)
Calcium: 8.9 mg/dL (ref 8.9–10.3)
GFR calc Af Amer: >60 mL/min (ref >60)
Glucose, Bld: 127 mg/dL — ABNORMAL HIGH (ref 65–99)
Potassium: 4.3 mmol/L (ref 3.5–5.1)
SODIUM: 137 mmol/L (ref 135–145)
Total Bilirubin: 0.3 mg/dL (ref 0.3–1.2)
Total Protein: 6.8 g/dL (ref 6.5–8.1)

## 2015-09-11 ENCOUNTER — Ambulatory Visit
Admission: RE | Admit: 2015-09-11 | Discharge: 2015-09-11 | Disposition: A | Discharge status: Home / Self Care | Payer: Managed Care, Other (non HMO) | Source: Ambulatory Visit | Attending: Radiation Oncology | Admitting: Radiation Oncology

## 2015-09-11 ENCOUNTER — Inpatient Hospital Stay (HOSPITAL_BASED_OUTPATIENT_CLINIC_OR_DEPARTMENT_OTHER): Payer: Managed Care, Other (non HMO) | Admitting: Oncology

## 2015-09-11 ENCOUNTER — Inpatient Hospital Stay: Payer: Managed Care, Other (non HMO)

## 2015-09-11 ENCOUNTER — Encounter: Payer: Self-pay | Admitting: Oncology

## 2015-09-11 VITALS — BP 117/79 | HR 72 | Temp 96.6°F | Wt 221.2 lb

## 2015-09-11 DIAGNOSIS — A630 Anogenital (venereal) warts: Secondary | ICD-10-CM

## 2015-09-11 DIAGNOSIS — F17210 Nicotine dependence, cigarettes, uncomplicated: Secondary | ICD-10-CM

## 2015-09-11 DIAGNOSIS — C50211 Malignant neoplasm of upper-inner quadrant of right female breast: Secondary | ICD-10-CM | POA: Diagnosis not present

## 2015-09-11 DIAGNOSIS — D869 Sarcoidosis, unspecified: Secondary | ICD-10-CM | POA: Diagnosis not present

## 2015-09-11 DIAGNOSIS — Z803 Family history of malignant neoplasm of breast: Secondary | ICD-10-CM

## 2015-09-11 DIAGNOSIS — Z170 Estrogen receptor positive status [ER+]: Secondary | ICD-10-CM | POA: Diagnosis not present

## 2015-09-11 DIAGNOSIS — E669 Obesity, unspecified: Secondary | ICD-10-CM | POA: Diagnosis not present

## 2015-09-11 DIAGNOSIS — Z8701 Personal history of pneumonia (recurrent): Secondary | ICD-10-CM

## 2015-09-11 NOTE — Progress Notes (Signed)
[employer] @ [facility] [phone]  Fax:([phone]) [place]: 29-Jun-1966  MR#: [id number]  GUY#:[id number]  Patient Care Team: Gearldine Shown, DO as PCP - General (Family Medicine) Robert Bellow, MD (General Surgery) Gearldine Shown, DO as Referring Physician (Family Medicine)  CHIEF COMPLAINT:   88.  49 year old lady with a history of carcinoma of breast status post lumpectomy (right breast) T1 cN1 M0 estrogen and progesterone receptor positive HER-2/neu receptor negative stage II disease diagnosis in July of 2016 It is post lumpectomy and sentinel lymph node evaluation to positive lymph node macro metastases with disease.  2.  BRCA: No significant mutation observed.  (August of 2016). 3.  Multiple gene analysis by mammoprint shows low risk (August, 2016)\ 4.  Patient started radiation therapy.  (August, 2016) VISIT DIAGNOSIS:  Carcinoma of right breast  No history exists.    Oncology Flowsheet 07/17/2015  dexamethasone (DECADRON) IV -  ondansetron (ZOFRAN) IV -    INTERVAL HISTORY:  patient is a 49 year old  Moderately obese  female who presented with an abnormal mammogram of her left breast showing a 1.9 cm irregular spiculated mass located in the upper inner quadrant of the right breast at the 2:30 position. She also had another complex cyst measuring 2.3 cm within the right breast at the 10:00 position both were biopsiedand lesion at the 2:30 position was positive for invasive mammary carcinoma. Lesion at 10:00 position showed no evidence of malignancy. She then underwent a wide local excision and sentinel node biopsy.tumor measured 2.0 cm margins were close but negative for malignancy at 1 mm. She had 3 sentinel nodes removed 2 showed macro metastatic disease. Patient has done well postoperatively. Tumor was ER/PR positive HER-2/neu not overexpressed.  Consult is being requested for decisions regarding systemic  chemotherapy. She specifically denies breast tenderness..  September 11, 2015 Patient is here for ongoing evaluation and treatment consideration Patient is finishing the radiation therapy on October 22 Here to discuss possibility of follow-up further anti-hormonal treatment options She is still having menstrual cycle  August, 2016) Patient is here to discuss the results of multiagent analysis which reveals low risk . We discussed standard therapy with chemotherapy versus recent information regarding low risk patient doing as good as chemotherapy plus anti-hormonal therapy versus only anti-hormonal therapy.   Patient is unwilling to go through chemotherapy at present time .  Will start radiation therapy As per HPI. Otherwise, a complete review of systems is negatve.  PAST MEDICAL HISTORY: Past Medical History  Diagnosis Date  . STD (female)   . HPV in female 2016  . Dog bite of left lower leg 03/07/15  . Pneumonia 02/2015  . BRCA negative 2016  . Breast cancer 05/28/15    pT1c,N1a:2/3 SLN positive for macrometastisi,ER positive, PR positive, HER-2/neu not overexpressed    PAST SURGICAL HISTORY: Past Surgical History  Procedure Laterality Date  . Colposcopy  05/28/15    High grade squamous intraepithelial lesion  . Breast surgery Right 05/28/15  . Wisdom tooth extraction    . Breast lumpectomy with sentinel lymph node biopsy Right 07/17/2015    Procedure: RIGHT BREAST WIDE EXCISION WITH SENTINEL NODE BIOPSY;  Surgeon: Robert Bellow, MD;  Location: [facility] ORS;  Service: General;  Laterality: Right;    FAMILY HISTORY Family History  Problem Relation Age of Onset  . Cancer Maternal Aunt 50    breast        ADVANCED DIRECTIVES:  Patient does  not have any living will or healthcare power of attorney.  Information was given .  Available resources had been discussed.  We will follow-up on subsequent appointments regarding this issue  HEALTH MAINTENANCE: Social History    Substance Use Topics  . Smoking status: Current Every Day Smoker -- 1.00 packs/day for 36 years    Types: Cigarettes  . Smokeless tobacco: Never Used  . Alcohol Use: 0.0 oz/week    0 Standard drinks or equivalent per week     Comment: occ       No Known Allergies  No current outpatient prescriptions on file.   No current facility-administered medications for this visit.    OBJECTIVE: PHYSICAL EXAM: Gen. status: Patient is moderately  Obese with a BMI of 39.  Slightly apprehensive but not any acute distress  Head exam was generally normal. There was no scleral icterus or corneal arcus. Mucous membranes were moist. Examination of the skin revealed no evidence of significant rashes, suspicious appearing nevi or other concerning lesions. Lymphatic system: Supraclavicular, cervical, axillary, inguinal lymph nodes are not palpable  Cardiac exam revealed the PMI to be normally situated and sized. The rhythm was regular and no extrasystoles were noted during several minutes of auscultation. The first and second heart sounds were normal and physiologic splitting of the second heart sound was noted. There were no murmurs, rubs, clicks, or gallops Examination of the chest was unremarkable. There were no bony deformities, no asymmetry, and no other abnormalities. Examination of both breast: Right breast lumpectomy slight redness.  Wound is healing.  Left breast free of masses Abdominal exam revealed normal bowel sounds. The abdomen was soft, non-tender, and without masses, organomegaly, or appreciable enlargement of the abdominal aorta. Neurologically, the patient was awake, alert, and oriented to person, place and time. There were no obvious focal neurologic abnormalities. Musculoskeletal system without any significant problem Your extremity trace edema.  Filed Vitals:   09/11/15 1002  BP: 117/79  Pulse: 72  Temp: 96.6 F (35.9 C)     Body mass index is 39.2 kg/(m^2).    ECOG FS:1 -  Symptomatic but completely ambulatory  LAB RESULts June 03, 2015    Ref Range 69mo ago    CA 27.29 0.0 - 38.6 U/mL 15.2         Pathology has been reviewed.   STUDIES: No results found.  ASSESSMENT:  1  Carcinoma of right breast T1 cN1 M0 tumor stage II estrogen receptor positive progesterone receptor positive HER-2/neu receptor negative. Status post lumpectomy and sentinel lymph node evaluation   PLAN:   Patient is BRCA negative.  Patient is moderately obese with a BMI of 39. Patient is finishing radiation therapy on October 22. Patient also carries diagnosis of sarcoidosis will get information from Dr. Reita Cliche office regarding how the diagnosis was made.  As I would worry that patient is smoker.  Might have to get a CT scan of the chest And the records from Dr. Reita Cliche office. Patient did not have any CT scan.  ACE was done but I do not have results.  Patient may need CT scan Patient carries diagnosis of sarcoidosis  2.  I had detailed discussion about anti-hormonal therapy or options include Off protocol Tamoxifen was plus Trelstar or Lupron Tamoxifen alone Or our available study  PALLAS which is randomizing tamoxifen versus IBRANCE plus tamoxifen I discussed situation with nurse coordinator Patient will have appointment with our research nurse K Shoffner to discuss available study  3.  Patient was also counseled regarding smoking cessation program.   Patient expressed understanding and was in agreement with this plan. She also understands that She can call clinic at any time with any questions, concerns, or complaints.    No matching staging information was found for the patient.  Forest Gleason, MD   09/11/2015 10:28 AM

## 2015-09-11 NOTE — Progress Notes (Signed)
Patient does not have living will.  Currently smokes. 

## 2015-09-12 ENCOUNTER — Ambulatory Visit
Admission: RE | Admit: 2015-09-12 | Discharge: 2015-09-12 | Disposition: A | Discharge status: Home / Self Care | Payer: Managed Care, Other (non HMO) | Source: Ambulatory Visit | Attending: Radiation Oncology | Admitting: Radiation Oncology

## 2015-09-12 ENCOUNTER — Encounter: Payer: Self-pay | Admitting: Oncology

## 2015-09-12 DIAGNOSIS — C50211 Malignant neoplasm of upper-inner quadrant of right female breast: Secondary | ICD-10-CM | POA: Diagnosis not present

## 2015-09-15 ENCOUNTER — Ambulatory Visit
Admission: RE | Admit: 2015-09-15 | Discharge: 2015-09-15 | Disposition: A | Discharge status: Home / Self Care | Payer: Managed Care, Other (non HMO) | Source: Ambulatory Visit | Attending: Radiation Oncology | Admitting: Radiation Oncology

## 2015-09-15 DIAGNOSIS — C50211 Malignant neoplasm of upper-inner quadrant of right female breast: Secondary | ICD-10-CM | POA: Diagnosis not present

## 2015-09-16 ENCOUNTER — Ambulatory Visit
Admission: RE | Admit: 2015-09-16 | Discharge: 2015-09-16 | Disposition: A | Discharge status: Home / Self Care | Payer: Managed Care, Other (non HMO) | Source: Ambulatory Visit | Attending: Radiation Oncology | Admitting: Radiation Oncology

## 2015-09-16 DIAGNOSIS — C50211 Malignant neoplasm of upper-inner quadrant of right female breast: Secondary | ICD-10-CM | POA: Diagnosis not present

## 2015-09-17 ENCOUNTER — Ambulatory Visit: Payer: Managed Care, Other (non HMO)

## 2015-09-17 ENCOUNTER — Ambulatory Visit
Admission: RE | Admit: 2015-09-17 | Discharge: 2015-09-17 | Disposition: A | Discharge status: Home / Self Care | Payer: Managed Care, Other (non HMO) | Source: Ambulatory Visit | Attending: Radiation Oncology | Admitting: Radiation Oncology

## 2015-09-17 ENCOUNTER — Other Ambulatory Visit: Payer: Self-pay | Admitting: Specialist

## 2015-09-17 DIAGNOSIS — C50211 Malignant neoplasm of upper-inner quadrant of right female breast: Secondary | ICD-10-CM | POA: Diagnosis not present

## 2015-09-17 DIAGNOSIS — R911 Solitary pulmonary nodule: Secondary | ICD-10-CM

## 2015-09-18 ENCOUNTER — Ambulatory Visit
Admission: RE | Admit: 2015-09-18 | Discharge: 2015-09-18 | Disposition: A | Discharge status: Home / Self Care | Payer: Managed Care, Other (non HMO) | Source: Ambulatory Visit | Attending: Radiation Oncology | Admitting: Radiation Oncology

## 2015-09-18 ENCOUNTER — Ambulatory Visit: Payer: Managed Care, Other (non HMO)

## 2015-09-19 ENCOUNTER — Ambulatory Visit: Payer: Managed Care, Other (non HMO)

## 2015-09-19 ENCOUNTER — Ambulatory Visit
Admission: RE | Admit: 2015-09-19 | Discharge: 2015-09-19 | Disposition: A | Discharge status: Home / Self Care | Payer: Managed Care, Other (non HMO) | Source: Ambulatory Visit | Attending: Radiation Oncology | Admitting: Radiation Oncology

## 2015-09-22 ENCOUNTER — Ambulatory Visit: Payer: Managed Care, Other (non HMO)

## 2015-09-22 ENCOUNTER — Ambulatory Visit
Admission: RE | Admit: 2015-09-22 | Discharge: 2015-09-22 | Disposition: A | Discharge status: Home / Self Care | Payer: Managed Care, Other (non HMO) | Source: Ambulatory Visit | Attending: Radiation Oncology | Admitting: Radiation Oncology

## 2015-09-22 DIAGNOSIS — C50211 Malignant neoplasm of upper-inner quadrant of right female breast: Secondary | ICD-10-CM | POA: Diagnosis not present

## 2015-09-23 ENCOUNTER — Ambulatory Visit
Admission: RE | Admit: 2015-09-23 | Discharge: 2015-09-23 | Disposition: A | Discharge status: Home / Self Care | Payer: Managed Care, Other (non HMO) | Source: Ambulatory Visit | Attending: Radiation Oncology | Admitting: Radiation Oncology

## 2015-09-23 ENCOUNTER — Ambulatory Visit: Payer: Managed Care, Other (non HMO)

## 2015-09-23 ENCOUNTER — Ambulatory Visit
Admission: RE | Admit: 2015-09-23 | Discharge: 2015-09-23 | Disposition: A | Discharge status: Home / Self Care | Payer: Managed Care, Other (non HMO) | Source: Ambulatory Visit | Attending: Specialist | Admitting: Specialist

## 2015-09-23 DIAGNOSIS — C50211 Malignant neoplasm of upper-inner quadrant of right female breast: Secondary | ICD-10-CM | POA: Diagnosis not present

## 2015-09-23 DIAGNOSIS — R911 Solitary pulmonary nodule: Secondary | ICD-10-CM | POA: Diagnosis not present

## 2015-09-23 MED ORDER — IOHEXOL 300 MG/ML  SOLN
75.0000 mL | Freq: Once | INTRAMUSCULAR | Status: AC | PRN
  Administered 2015-09-23: 75 mL via INTRAVENOUS

## 2015-09-24 ENCOUNTER — Inpatient Hospital Stay: Payer: Managed Care, Other (non HMO) | Attending: Oncology

## 2015-09-24 ENCOUNTER — Ambulatory Visit
Admission: RE | Admit: 2015-09-24 | Discharge: 2015-09-24 | Disposition: A | Discharge status: Home / Self Care | Payer: Managed Care, Other (non HMO) | Source: Ambulatory Visit | Attending: Radiation Oncology | Admitting: Radiation Oncology

## 2015-09-24 ENCOUNTER — Ambulatory Visit: Payer: Managed Care, Other (non HMO)

## 2015-09-24 DIAGNOSIS — A630 Anogenital (venereal) warts: Secondary | ICD-10-CM | POA: Insufficient documentation

## 2015-09-24 DIAGNOSIS — Z79810 Long term (current) use of selective estrogen receptor modulators (SERMs): Secondary | ICD-10-CM | POA: Diagnosis not present

## 2015-09-24 DIAGNOSIS — F17210 Nicotine dependence, cigarettes, uncomplicated: Secondary | ICD-10-CM | POA: Insufficient documentation

## 2015-09-24 DIAGNOSIS — Z170 Estrogen receptor positive status [ER+]: Secondary | ICD-10-CM | POA: Insufficient documentation

## 2015-09-24 DIAGNOSIS — K8020 Calculus of gallbladder without cholecystitis without obstruction: Secondary | ICD-10-CM | POA: Insufficient documentation

## 2015-09-24 DIAGNOSIS — C50211 Malignant neoplasm of upper-inner quadrant of right female breast: Secondary | ICD-10-CM | POA: Insufficient documentation

## 2015-09-24 DIAGNOSIS — Z8701 Personal history of pneumonia (recurrent): Secondary | ICD-10-CM | POA: Insufficient documentation

## 2015-09-24 DIAGNOSIS — E669 Obesity, unspecified: Secondary | ICD-10-CM | POA: Diagnosis not present

## 2015-09-24 DIAGNOSIS — C50911 Malignant neoplasm of unspecified site of right female breast: Secondary | ICD-10-CM

## 2015-09-24 DIAGNOSIS — R918 Other nonspecific abnormal finding of lung field: Secondary | ICD-10-CM | POA: Insufficient documentation

## 2015-09-24 DIAGNOSIS — Z923 Personal history of irradiation: Secondary | ICD-10-CM | POA: Insufficient documentation

## 2015-09-24 LAB — CBC
HEMATOCRIT: 41 % (ref 35.0–47.0)
HEMOGLOBIN: 13.8 g/dL (ref 12.0–16.0)
MCH: 29.5 pg (ref 26.0–34.0)
MCHC: 33.6 g/dL (ref 32.0–36.0)
MCV: 87.8 fL (ref 80.0–100.0)
Platelets: 252 10*3/uL (ref 150–440)
RBC: 4.67 MIL/uL (ref 3.80–5.20)
RDW: 15.7 % — ABNORMAL HIGH (ref 11.5–14.5)
WBC: 6.4 10*3/uL (ref 3.6–11.0)

## 2015-09-25 ENCOUNTER — Ambulatory Visit
Admission: RE | Admit: 2015-09-25 | Discharge: 2015-09-25 | Disposition: A | Discharge status: Home / Self Care | Payer: Managed Care, Other (non HMO) | Source: Ambulatory Visit | Attending: Radiation Oncology | Admitting: Radiation Oncology

## 2015-09-25 ENCOUNTER — Ambulatory Visit: Payer: Managed Care, Other (non HMO)

## 2015-09-25 DIAGNOSIS — C50211 Malignant neoplasm of upper-inner quadrant of right female breast: Secondary | ICD-10-CM | POA: Diagnosis not present

## 2015-09-26 ENCOUNTER — Ambulatory Visit
Admission: RE | Admit: 2015-09-26 | Discharge: 2015-09-26 | Disposition: A | Discharge status: Home / Self Care | Payer: Managed Care, Other (non HMO) | Source: Ambulatory Visit | Attending: Radiation Oncology | Admitting: Radiation Oncology

## 2015-09-26 ENCOUNTER — Ambulatory Visit: Payer: Managed Care, Other (non HMO) | Admitting: Radiation Oncology

## 2015-09-26 DIAGNOSIS — C50211 Malignant neoplasm of upper-inner quadrant of right female breast: Secondary | ICD-10-CM | POA: Diagnosis not present

## 2015-09-29 ENCOUNTER — Ambulatory Visit
Admission: RE | Admit: 2015-09-29 | Discharge: 2015-09-29 | Disposition: A | Discharge status: Home / Self Care | Payer: Managed Care, Other (non HMO) | Source: Ambulatory Visit | Attending: Radiation Oncology | Admitting: Radiation Oncology

## 2015-09-29 ENCOUNTER — Ambulatory Visit: Payer: Managed Care, Other (non HMO) | Admitting: Radiation Oncology

## 2015-09-29 DIAGNOSIS — C50211 Malignant neoplasm of upper-inner quadrant of right female breast: Secondary | ICD-10-CM | POA: Diagnosis not present

## 2015-09-30 ENCOUNTER — Ambulatory Visit
Admission: RE | Admit: 2015-09-30 | Discharge: 2015-09-30 | Disposition: A | Discharge status: Home / Self Care | Payer: Managed Care, Other (non HMO) | Source: Ambulatory Visit | Attending: Radiation Oncology | Admitting: Radiation Oncology

## 2015-09-30 ENCOUNTER — Ambulatory Visit: Payer: Managed Care, Other (non HMO) | Admitting: Radiation Oncology

## 2015-09-30 DIAGNOSIS — C50211 Malignant neoplasm of upper-inner quadrant of right female breast: Secondary | ICD-10-CM | POA: Diagnosis not present

## 2015-10-01 ENCOUNTER — Ambulatory Visit: Payer: Managed Care, Other (non HMO) | Admitting: Radiation Oncology

## 2015-10-01 ENCOUNTER — Ambulatory Visit
Admission: RE | Admit: 2015-10-01 | Discharge: 2015-10-01 | Disposition: A | Discharge status: Home / Self Care | Payer: Managed Care, Other (non HMO) | Source: Ambulatory Visit | Attending: Radiation Oncology | Admitting: Radiation Oncology

## 2015-10-01 DIAGNOSIS — C50211 Malignant neoplasm of upper-inner quadrant of right female breast: Secondary | ICD-10-CM | POA: Diagnosis not present

## 2015-10-02 ENCOUNTER — Ambulatory Visit
Admission: RE | Admit: 2015-10-02 | Discharge: 2015-10-02 | Disposition: A | Discharge status: Home / Self Care | Payer: Managed Care, Other (non HMO) | Source: Ambulatory Visit | Attending: Radiation Oncology | Admitting: Radiation Oncology

## 2015-10-02 ENCOUNTER — Encounter: Payer: Self-pay | Admitting: *Deleted

## 2015-10-02 ENCOUNTER — Ambulatory Visit: Payer: Managed Care, Other (non HMO) | Admitting: Radiation Oncology

## 2015-10-02 ENCOUNTER — Ambulatory Visit: Admission: RE | Admit: 2015-10-02 | Payer: Managed Care, Other (non HMO) | Source: Ambulatory Visit

## 2015-10-02 DIAGNOSIS — C50211 Malignant neoplasm of upper-inner quadrant of right female breast: Secondary | ICD-10-CM | POA: Diagnosis not present

## 2015-10-03 ENCOUNTER — Ambulatory Visit
Admission: RE | Admit: 2015-10-03 | Discharge: 2015-10-03 | Disposition: A | Discharge status: Home / Self Care | Payer: Managed Care, Other (non HMO) | Source: Ambulatory Visit | Attending: Radiation Oncology | Admitting: Radiation Oncology

## 2015-10-03 DIAGNOSIS — C50211 Malignant neoplasm of upper-inner quadrant of right female breast: Secondary | ICD-10-CM | POA: Diagnosis not present

## 2015-10-06 ENCOUNTER — Ambulatory Visit
Admission: RE | Admit: 2015-10-06 | Discharge: 2015-10-06 | Disposition: A | Discharge status: Home / Self Care | Payer: Managed Care, Other (non HMO) | Source: Ambulatory Visit | Attending: Radiation Oncology | Admitting: Radiation Oncology

## 2015-10-06 DIAGNOSIS — C50211 Malignant neoplasm of upper-inner quadrant of right female breast: Secondary | ICD-10-CM | POA: Diagnosis not present

## 2015-10-07 ENCOUNTER — Ambulatory Visit: Payer: Managed Care, Other (non HMO) | Admitting: Radiation Oncology

## 2015-10-07 ENCOUNTER — Ambulatory Visit
Admission: RE | Admit: 2015-10-07 | Discharge: 2015-10-07 | Disposition: A | Discharge status: Home / Self Care | Payer: Managed Care, Other (non HMO) | Source: Ambulatory Visit | Attending: Radiation Oncology | Admitting: Radiation Oncology

## 2015-10-07 DIAGNOSIS — C50211 Malignant neoplasm of upper-inner quadrant of right female breast: Secondary | ICD-10-CM | POA: Diagnosis not present

## 2015-10-08 ENCOUNTER — Ambulatory Visit
Admission: RE | Admit: 2015-10-08 | Discharge: 2015-10-08 | Disposition: A | Discharge status: Home / Self Care | Payer: Managed Care, Other (non HMO) | Source: Ambulatory Visit | Attending: Radiation Oncology | Admitting: Radiation Oncology

## 2015-10-08 DIAGNOSIS — C50211 Malignant neoplasm of upper-inner quadrant of right female breast: Secondary | ICD-10-CM | POA: Diagnosis not present

## 2015-10-09 ENCOUNTER — Ambulatory Visit
Admission: RE | Admit: 2015-10-09 | Discharge: 2015-10-09 | Disposition: A | Discharge status: Home / Self Care | Payer: Managed Care, Other (non HMO) | Source: Ambulatory Visit | Attending: Radiation Oncology | Admitting: Radiation Oncology

## 2015-10-09 DIAGNOSIS — C50211 Malignant neoplasm of upper-inner quadrant of right female breast: Secondary | ICD-10-CM | POA: Diagnosis not present

## 2015-10-10 ENCOUNTER — Ambulatory Visit
Admission: RE | Admit: 2015-10-10 | Discharge: 2015-10-10 | Disposition: A | Discharge status: Home / Self Care | Payer: Managed Care, Other (non HMO) | Source: Ambulatory Visit | Attending: Radiation Oncology | Admitting: Radiation Oncology

## 2015-10-10 ENCOUNTER — Ambulatory Visit: Payer: Managed Care, Other (non HMO) | Admitting: Radiation Oncology

## 2015-10-10 DIAGNOSIS — C50211 Malignant neoplasm of upper-inner quadrant of right female breast: Secondary | ICD-10-CM | POA: Diagnosis not present

## 2015-10-13 ENCOUNTER — Ambulatory Visit
Admission: RE | Admit: 2015-10-13 | Discharge: 2015-10-13 | Disposition: A | Discharge status: Home / Self Care | Payer: Managed Care, Other (non HMO) | Source: Ambulatory Visit | Attending: Radiation Oncology | Admitting: Radiation Oncology

## 2015-10-13 ENCOUNTER — Ambulatory Visit: Payer: Managed Care, Other (non HMO) | Admitting: Radiation Oncology

## 2015-10-13 DIAGNOSIS — C50211 Malignant neoplasm of upper-inner quadrant of right female breast: Secondary | ICD-10-CM | POA: Diagnosis not present

## 2015-10-14 ENCOUNTER — Ambulatory Visit
Admission: RE | Admit: 2015-10-14 | Discharge: 2015-10-14 | Disposition: A | Discharge status: Home / Self Care | Payer: Managed Care, Other (non HMO) | Source: Ambulatory Visit | Attending: Radiation Oncology | Admitting: Radiation Oncology

## 2015-10-14 DIAGNOSIS — C50211 Malignant neoplasm of upper-inner quadrant of right female breast: Secondary | ICD-10-CM | POA: Diagnosis not present

## 2015-10-15 ENCOUNTER — Inpatient Hospital Stay (HOSPITAL_BASED_OUTPATIENT_CLINIC_OR_DEPARTMENT_OTHER): Payer: Managed Care, Other (non HMO) | Admitting: Oncology

## 2015-10-15 ENCOUNTER — Encounter: Payer: Self-pay | Admitting: Oncology

## 2015-10-15 VITALS — BP 117/82 | HR 76 | Temp 97.0°F | Wt 219.4 lb

## 2015-10-15 DIAGNOSIS — K8020 Calculus of gallbladder without cholecystitis without obstruction: Secondary | ICD-10-CM

## 2015-10-15 DIAGNOSIS — C50211 Malignant neoplasm of upper-inner quadrant of right female breast: Secondary | ICD-10-CM

## 2015-10-15 DIAGNOSIS — F17210 Nicotine dependence, cigarettes, uncomplicated: Secondary | ICD-10-CM

## 2015-10-15 DIAGNOSIS — Z79810 Long term (current) use of selective estrogen receptor modulators (SERMs): Secondary | ICD-10-CM | POA: Diagnosis not present

## 2015-10-15 DIAGNOSIS — Z170 Estrogen receptor positive status [ER+]: Secondary | ICD-10-CM | POA: Diagnosis not present

## 2015-10-15 DIAGNOSIS — Z923 Personal history of irradiation: Secondary | ICD-10-CM

## 2015-10-15 DIAGNOSIS — R918 Other nonspecific abnormal finding of lung field: Secondary | ICD-10-CM

## 2015-10-15 DIAGNOSIS — E669 Obesity, unspecified: Secondary | ICD-10-CM

## 2015-10-15 DIAGNOSIS — Z8701 Personal history of pneumonia (recurrent): Secondary | ICD-10-CM

## 2015-10-15 DIAGNOSIS — A630 Anogenital (venereal) warts: Secondary | ICD-10-CM

## 2015-10-15 MED ORDER — TAMOXIFEN CITRATE 20 MG PO TABS: 20.0000 mg | ORAL_TABLET | Freq: Every day | ORAL | Status: DC

## 2015-10-15 NOTE — Progress Notes (Signed)
[place] @ [facility] [phone]  Fax:([phone]) [place]: Jun 10, 1966  MR#: [id number]  SEG#:[id number]  Patient Care Team: Gearldine Shown, DO as PCP - General (Family Medicine) Robert Bellow, MD (General Surgery) Gearldine Shown, DO as Referring Physician (Family Medicine)  CHIEF COMPLAINT:   41.  49 year old lady with a history of carcinoma of breast status post lumpectomy (right breast) T1 cN1 M0 estrogen and progesterone receptor positive HER-2/neu receptor negative stage II disease diagnosis in July of 2016 It is post lumpectomy and sentinel lymph node evaluation to positive lymph node macro metastases with disease.  2.  BRCA: No significant mutation observed.  (August of 2016). 3.  Multiple gene analysis by mammoprint shows low risk (August, 2016)\ 4.  Patient started radiation therapy.  (August, 2016) 5.Patient has finished radiation therapy in October of 2016  6.Patient is starting tamoxifen 20 mg by mouth daily VISIT DIAGNOSIS:  Carcinoma of right breast  No history exists.    Oncology Flowsheet 07/17/2015  dexamethasone (DECADRON) IV -  ondansetron (ZOFRAN) IV -    INTERVAL HISTORY:  patient is a 49 year old  Moderately obese  female who presented with an abnormal mammogram of her left breast showing a 1.9 cm irregular spiculated mass located in the upper inner quadrant of the right breast at the 2:30 position. She also had another complex cyst measuring 2.3 cm within the right breast at the 10:00 position both were biopsiedand lesion at the 2:30 position was positive for invasive mammary carcinoma. Lesion at 10:00 position showed no evidence of malignancy. She then underwent a wide local excision and sentinel node biopsy.tumor measured 2.0 cm margins were close but negative for malignancy at 1 mm.   Patient has finished radiation therapy.  2.  Patient is here to consider anti-hormonal treatment.  Because  of Mammo print score being low patient would be given only anti-hormonal therapy versus chemotherapy and I discussed situation that according to new trial there might be some benefit of adding chemotherapy in the clinically higher risk and low despite multiagent analysis however patient at this point in time is going to go with anti-hormonal therapy   Patient is planning to get a hysterectomy done.     PAST MEDICAL HISTORY: Past Medical History  Diagnosis Date  . STD (female)   . HPV in female 2016  . Dog bite of left lower leg 03/07/15  . Pneumonia 02/2015  . BRCA negative 2016  . Breast cancer ([place]) 05/28/15    pT1c,N1a:2/3 SLN positive for macrometastisi,ER positive, PR positive, HER-2/neu not overexpressed    PAST SURGICAL HISTORY: Past Surgical History  Procedure Laterality Date  . Colposcopy  05/28/15    High grade squamous intraepithelial lesion  . Breast surgery Right 05/28/15  . Wisdom tooth extraction    . Breast lumpectomy with sentinel lymph node biopsy Right 07/17/2015    Procedure: RIGHT BREAST WIDE EXCISION WITH SENTINEL NODE BIOPSY;  Surgeon: Robert Bellow, MD;  Location: [facility] ORS;  Service: General;  Laterality: Right;    FAMILY HISTORY Family History  Problem Relation Age of Onset  . Cancer Maternal Aunt 50    breast        ADVANCED DIRECTIVES:  Patient does not have any living will or healthcare power of attorney.  Information was given .  Available resources had been discussed.  We will follow-up on subsequent appointments regarding this issue  HEALTH MAINTENANCE: Social History  Substance Use Topics  .  Smoking status: Current Every Day Smoker -- 1.00 packs/day for 36 years    Types: Cigarettes  . Smokeless tobacco: Never Used  . Alcohol Use: 0.0 oz/week    0 Standard drinks or equivalent per week     Comment: occ       No Known Allergies  No current outpatient prescriptions on file.   No current facility-administered medications for  this visit.    OBJECTIVE: PHYSICAL EXAM: Gen. status: Patient is moderately  Obese with a BMI of 39.  Slightly apprehensive but not any acute distress  Head exam was generally normal. There was no scleral icterus or corneal arcus. Mucous membranes were moist. Examination of the skin revealed no evidence of significant rashes, suspicious appearing nevi or other concerning lesions. Lymphatic system: Supraclavicular, cervical, axillary, inguinal lymph nodes are not palpable  Cardiac exam revealed the PMI to be normally situated and sized. The rhythm was regular and no extrasystoles were noted during several minutes of auscultation. The first and second heart sounds were normal and physiologic splitting of the second heart sound was noted. There were no murmurs, rubs, clicks, or gallops Examination of the chest was unremarkable. There were no bony deformities, no asymmetry, and no other abnormalities. Examination of right breast shows radiation changes lab breast free of masses Abdominal exam revealed normal bowel sounds. The abdomen was soft, non-tender, and without masses, organomegaly, or appreciable enlargement of the abdominal aorta. Neurologically, the patient was awake, alert, and oriented to person, place and time. There were no obvious focal neurologic abnormalities. Musculoskeletal system without any significant problem Your extremity trace edema.  Filed Vitals:   10/15/15 1101  BP: 117/82  Pulse: 76  Temp: 97 F (36.1 C)     Body mass index is 38.87 kg/(m^2).    ECOG FS:1 - Symptomatic but completely ambulatory  LAB RESULts June 03, 2015    Ref Range 37moago    CA 27.29 0.0 - 38.6 U/mL 15.2         Pathology has been reviewed.   STUDIES: Ct Chest W Contrast  09/23/2015  CLINICAL DATA:  Breast cancer, prior lumpectomy. Positive lymph node involvement. Radiation therapy is been started. Pulmonary nodule in the left lower lobe on chest radiography. EXAM: CT CHEST WITH  CONTRAST TECHNIQUE: Multidetector CT imaging of the chest was performed during intravenous contrast administration. CONTRAST:  731mOMNIPAQUE IOHEXOL 300 MG/ML  SOLN COMPARISON:  Report from 09/16/2015 FINDINGS: Mediastinum/Nodes: Unremarkable Lungs/Pleura: Posteriorly in the left upper lobe and partially along the major fissure, a 1.8 by 1.8 by 1.6 cm pulmonary nodule is primarily densely calcified but has a thin rim of marginal soft tissue. This is shown on image 124 series 6. There is an adjacent 2-3 mm nodular density along the major fissure on image 127 of series 6. Upper abdomen: We partially image the gallbladder showing cholelithiasis with a 1.4 cm gallstone on image 59 series 2 and at least 1 adjacent gallstone. Musculoskeletal: Unremarkable IMPRESSION: 1. The nodule of concern is in the left upper lobe and measures 1.8 cm in diameter. For the most part this nodule was very densely calcified, with only a thin rim of soft tissue density. Differential diagnostic considerations include old granulomatous disease, carcinoid tumor, calcifying fibrous pseudotumor, pulmonary hamartoma (which are common, but less likely to give the smooth type of calcifications seen in this lesion), or less likely primary lung cancer or metastatic lesion. Of these and based on the solitary nature and generally dense/diffuse calcification, old  granulomatous disease is favored with fibrous pseudotumor and carcinoid tumor being in the next tier of possibility, and the remaining lesions less likely. Nuclear medicine PET-CT could be utilized for further characterization if clinically warranted. Also the patient has any remote chest radiographs from outside institutions these could be used to further assess. Electronically Signed   By: Van Clines M.D.   On: 09/23/2015 09:41    ASSESSMENT:  1  Carcinoma of right breast T1 cN1 M0 tumor stage II estrogen receptor positive progesterone receptor positive HER-2/neu receptor  negative. Status post lumpectomy and sentinel lymph node evaluation   PLAN:   Patient has finished radiation therapy tolerated treatment very well has some radiation changes Possibility of tamoxifen is being considered.  As patient is premenopausal Patient will be started on tamoxifen 20 mg by mouth daily I had prolonged discussion regarding our clinical trial which can be started at any time in April of 20 17 which is randomizing patient to get tamoxifen versus placebo versus tamoxifen plus IBRANCE Patient is willing to consider that trial.  I would wait another month to be sure patient is not developing any side effect of tamoxifen Patient is planning for hysterectomy .  I will reevaluate patient in 3 months or before if there is any problem My research nurse will be in contact with her within a month to consider our clinical trial  [place]  [place]. Total duration of visit was 25  minutes.  50% or more time was spent in counseling patient and family regarding prognosis and options of treatment and available resources      No matching staging information was found for the patient.  Forest Gleason, MD   10/15/2015 11:30 AM

## 2015-11-17 DIAGNOSIS — D869 Sarcoidosis, unspecified: Secondary | ICD-10-CM | POA: Diagnosis not present

## 2015-11-17 DIAGNOSIS — Z01812 Encounter for preprocedural laboratory examination: Secondary | ICD-10-CM | POA: Diagnosis not present

## 2015-11-17 DIAGNOSIS — Z3200 Encounter for pregnancy test, result unknown: Secondary | ICD-10-CM | POA: Diagnosis not present

## 2015-11-17 DIAGNOSIS — N641 Fat necrosis of breast: Secondary | ICD-10-CM | POA: Diagnosis not present

## 2015-11-17 DIAGNOSIS — R911 Solitary pulmonary nodule: Secondary | ICD-10-CM | POA: Diagnosis not present

## 2015-11-17 DIAGNOSIS — R609 Edema, unspecified: Secondary | ICD-10-CM | POA: Diagnosis not present

## 2015-11-17 DIAGNOSIS — C50211 Malignant neoplasm of upper-inner quadrant of right female breast: Secondary | ICD-10-CM | POA: Diagnosis not present

## 2015-11-17 DIAGNOSIS — J9601 Acute respiratory failure with hypoxia: Secondary | ICD-10-CM | POA: Diagnosis not present

## 2015-11-17 NOTE — Pre-Procedure Instructions (Signed)
Called and left message regarding need for lab work.

## 2015-11-17 NOTE — Patient Instructions (Addendum)
  Your procedure is scheduled on: 12/01/15 Mon  Report to Day Surgery. To find out your arrival time please call [phone] between 1PM - 3PM on 11/28/15 Fri.  Remember: Instructions that are not followed completely may result in serious medical risk, up to and including death, or upon the discretion of your surgeon and anesthesiologist your surgery may need to be rescheduled.    __x__ 1. Do not eat food or drink liquids after midnight. No gum chewing or hard candies.     _x___ 2. No Alcohol for 24 hours before or after surgery.   ____ 3. Bring all medications with you on the day of surgery if instructed.    _x___ 4. Notify your doctor if there is any change in your medical condition     (cold, fever, infections).     Do not wear jewelry, make-up, hairpins, clips or nail polish.  Do not wear lotions, powders, or perfumes. You may wear deodorant.  Do not shave 48 hours prior to surgery. Men may shave face and neck.  Do not bring valuables to the hospital.    [facility] is not responsible for any belongings or valuables.               Contacts, dentures or bridgework may not be worn into surgery.  Leave your suitcase in the car. After surgery it may be brought to your room.  For patients admitted to the hospital, discharge time is determined by your                treatment team.   Patients discharged the day of surgery will not be allowed to drive home.   Please read over the following fact sheets that you were given:      _x__ Take these medicines the morning of surgery with A SIP OF WATER:    1.tamoxifen (NOLVADEX) 20 MG tablet  2.   3.   4.  5.  6.  ____ Fleet Enema (as directed)   _x___ Use CHG Soap as directed  ____ Use inhalers on the day of surgery  ____ Stop metformin 2 days prior to surgery    ____ Take 1/2 of usual insulin dose the night before surgery and none on the morning of surgery.   ____ Stop Coumadin/Plavix/aspirin on   x____ Stop  Anti-inflammatories on stop naproxen 1 week before surgery may use Tylenol as needed   ____ Stop supplements until after surgery.    ____ Bring C-Pap to the hospital.

## 2015-11-17 NOTE — H&P (Signed)
Patient ID: Paula [name] is a 49 y.o. female presenting with Pre Op Consulting on 10/21/2015 with surgery scheduled on 12/01/15.  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  Chronic smoker.   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 Treatment algorithm includes close followup with cytology with ECC at 4 mo, or repeat LEEP or CKC.   However, given her current breast cancer dx, continued spotting after LEEP in August, and tobacco abuse, pt is very leery about conservative management, and is interested in definitive management.  She currently being treated for breast cancer Also having spotting.  Past Medical History:  has a past medical history of Abnormal uterine bleeding and Cancer.  Past Surgical History:  has a past surgical history that includes wisdom teeth and Breast surgery (07/17/2015). Family History: family history includes Diabetes type II in her maternal grandmother; Hyperlipidemia in her maternal grandmother and mother; Hypertension in her maternal grandmother and mother; No Known Problems in her daughter and father; Thyroid disease in her maternal grandmother. Social History:  reports that she has been smoking Cigarettes. She has a 54.00 pack-year smoking history. She has never used smokeless tobacco. She reports that she drinks alcohol. She reports that she uses illicit drugs, including Marijuana, about once per week. OB/GYN History:  OB History    Gravida Para Term Preterm AB TAB SAB Ectopic Multiple Living   1 1 1       1       Allergies: has No Known Allergies. Medications:  Current Outpatient Prescriptions:  Marland Kitchen MULTIVITAMIN ORAL, Take by mouth., Disp: , Rfl:  . tamoxifen (NOLVADEX) 20 MG tablet, Take 20 mg by mouth., Disp: , Rfl:  . naproxen sodium (ALEVE, ANAPROX) 220 MG tablet, Take 220 mg by mouth 2 (two) times daily with meals., Disp: , Rfl:  .  oxyCODONE-acetaminophen (PERCOCET) 5-325 mg tablet, Take 1 tablet by mouth every 6 (six) hours as needed for Pain., Disp: , Rfl:   Review of Systems  Constitutional: Negative. Negative for fatigue and fever.  HENT: Negative.  Respiratory: Negative. Negative for shortness of breath.  Cardiovascular: Negative for chest pain and palpitations.  Gastrointestinal: Negative for abdominal pain, constipation and diarrhea.  Endocrine: Negative for cold intolerance.  Genitourinary: Negative for difficulty urinating, dyspareunia, dysuria, frequency, hematuria, menstrual problem, pelvic pain, vaginal bleeding and vaginal discharge.  Neurological: Negative for dizziness and light-headedness.  Psychiatric/Behavioral:  No changes in mood    Exam:         Visit Vitals  . BP 127/83  . Pulse 78  . Wt 99.7 kg (219 lb 12.8 oz)  . LMP 09/20/2015 (Approximate)  . BMI 39.56 kg/m2   Physical Exam  Constitutional: She is oriented to person, place, and time. She appears well-developed and well-nourished. No distress.  Eyes: No scleral icterus.  Neck: Normal range of motion. Neck supple. No tracheal deviation present. No thyromegaly present.  Cardiovascular: Normal rate, regular rhythm and normal heart sounds.  No murmur heard. Pulmonary/Chest: Effort normal and breath sounds normal. She has no wheezes. She has no rales. Right breast exhibits no inverted nipple, no mass, no nipple discharge, no skin change and no tenderness. Left breast exhibits no inverted nipple, no mass, no nipple discharge, no skin change and no tenderness. Breasts are symmetrical.  Abdominal: She exhibits no distension and no mass. There is no tenderness. There is no rebound and no guarding.  Genitourinary:  Genitourinary Comments:  Pelvic exam: External: Tanner stage 5, normal female genitalia without lesions or masses Bladder: Normal size without masses or tenderness, well-supported Urethra: No lesions or discharge with  palpation. Normal urethral size and location, no prolapse Vagina: normal physiological discharge, without lesions or masses Cervix: normal without lesions or masses Adnexa: normal bimanual exam without masses or fullness Uterus: Normal size and position without masses or tenderness.  Anus/Perineum: Normal external exam  No uterine descent on exam with Valsalva  Musculoskeletal: Normal range of motion.  Lymphadenopathy:  She has no cervical adenopathy.  Neurological: She is alert and oriented to person, place, and time.  Skin: Skin is warm and dry.  Psychiatric: She has a normal mood and affect.         Pelvic: tanner stage 5 ,  External genitalia: vulva /labia no lesions Urethra: no prolapse Vagina: normal physiologic d/c Cervix: no lesions, no cervical motion tenderness  Uterus: normal size shape and contour, non-tender Adnexa: no mass, non-tender  Rectovaginal: no mass heme negative  No cervical descent on exam in the office.  Impression:   The primary encounter diagnosis was HGSIL (high grade squamous intraepithelial lesion) on Pap smear of cervix. Diagnoses of Smoking addiction and H/O LEEP were also pertinent to this visit.    Plan:   Patient returns for a preoperative discussion regarding her plans to proceed with surgical treatment of her persistent cervical dysplasia with positive margins and long-term smoker by LAVH, BSO procedure.   The patient and I discussed the technical aspects of the procedure including the potential for risks and complications. These include but are not limited to the risk of infection requiring post-operative antibiotics or further procedures. We talked about the risk of injury to adjacent organs including bladder, bowel, ureter, blood vessels or nerves. We talked about the need to convert to an open incision. We talked about the possible need for blood transfusion. We talked aboutpostop complications such asthromboembolic or  cardiopulmonary complications. All of her questions were answered. Her preoperative exam was completed and the appropriate consents were signed. She is scheduled to undergo this procedure in December.

## 2015-11-18 ENCOUNTER — Other Ambulatory Visit: Payer: Managed Care, Other (non HMO)

## 2015-11-18 NOTE — Pre-Procedure Instructions (Signed)
Left message on pt's phone mailbox for pt to call PAT and make appointment for lab draw.

## 2015-11-19 ENCOUNTER — Encounter
Admission: RE | Admit: 2015-11-19 | Discharge: 2015-11-19 | Disposition: A | Discharge status: Home / Self Care | Payer: Managed Care, Other (non HMO) | Source: Ambulatory Visit | Attending: Obstetrics and Gynecology | Admitting: Obstetrics and Gynecology

## 2015-11-19 ENCOUNTER — Ambulatory Visit
Admission: RE | Admit: 2015-11-19 | Discharge: 2015-11-19 | Disposition: A | Discharge status: Home / Self Care | Payer: Managed Care, Other (non HMO) | Source: Ambulatory Visit | Attending: Radiation Oncology | Admitting: Radiation Oncology

## 2015-11-19 ENCOUNTER — Encounter: Payer: Self-pay | Admitting: Radiation Oncology

## 2015-11-19 VITALS — BP 129/78 | HR 79 | Temp 97.2°F | Resp 20 | Ht 63.0 in | Wt 221.2 lb

## 2015-11-19 DIAGNOSIS — D869 Sarcoidosis, unspecified: Secondary | ICD-10-CM | POA: Insufficient documentation

## 2015-11-19 DIAGNOSIS — C50911 Malignant neoplasm of unspecified site of right female breast: Secondary | ICD-10-CM

## 2015-11-19 DIAGNOSIS — R911 Solitary pulmonary nodule: Secondary | ICD-10-CM | POA: Insufficient documentation

## 2015-11-19 DIAGNOSIS — Z01812 Encounter for preprocedural laboratory examination: Secondary | ICD-10-CM | POA: Insufficient documentation

## 2015-11-19 DIAGNOSIS — N641 Fat necrosis of breast: Secondary | ICD-10-CM | POA: Insufficient documentation

## 2015-11-19 DIAGNOSIS — Z3200 Encounter for pregnancy test, result unknown: Secondary | ICD-10-CM | POA: Insufficient documentation

## 2015-11-19 DIAGNOSIS — C50211 Malignant neoplasm of upper-inner quadrant of right female breast: Secondary | ICD-10-CM | POA: Insufficient documentation

## 2015-11-19 DIAGNOSIS — J9601 Acute respiratory failure with hypoxia: Secondary | ICD-10-CM | POA: Insufficient documentation

## 2015-11-19 DIAGNOSIS — R609 Edema, unspecified: Secondary | ICD-10-CM | POA: Insufficient documentation

## 2015-11-19 LAB — BASIC METABOLIC PANEL
ANION GAP: 6 (ref 5–15)
BUN: 10 mg/dL (ref 6–20)
CALCIUM: 8.8 mg/dL — AB (ref 8.9–10.3)
CO2: 27 mmol/L (ref 22–32)
Chloride: 108 mmol/L (ref 101–111)
Creatinine, Ser: 0.62 mg/dL (ref 0.44–1.00)
GLUCOSE: 117 mg/dL — AB (ref 65–99)
POTASSIUM: 4.1 mmol/L (ref 3.5–5.1)
Sodium: 141 mmol/L (ref 135–145)

## 2015-11-19 LAB — CBC
HEMATOCRIT: 39.2 % (ref 35.0–47.0)
HEMOGLOBIN: 13 g/dL (ref 12.0–16.0)
MCH: 30 pg (ref 26.0–34.0)
MCHC: 33.1 g/dL (ref 32.0–36.0)
MCV: 90.6 fL (ref 80.0–100.0)
Platelets: 231 10*3/uL (ref 150–440)
RBC: 4.33 MIL/uL (ref 3.80–5.20)
RDW: 15.7 % — ABNORMAL HIGH (ref 11.5–14.5)
WBC: 6 10*3/uL (ref 3.6–11.0)

## 2015-11-19 LAB — ABO/RH: ABO/RH(D): A POS

## 2015-11-19 NOTE — Progress Notes (Signed)
Radiation Oncology Follow up Note  Name: Paula [name]   Date:   11/19/2015 MRN:  [id number] DOB: 14-Mar-1966    This 49 y.o. female presents to the clinic today for follow-up for stage II (T1 CN I M0 ER/PR positive HER-2/neu negative invasive mammary carcinoma status post wide local excision and adjuvant whole breast radiation.Marland Kitchen  REFERRING PROVIDER: Garald Balding*  HPI: Patient is a 49 year old female now 1 month out having completed whole breast radiation to her left breast for a T1 CN I ER/PR positive HER-2/neu negative invasive mammary carcinoma. BRCA test was negative. She is currently on tamoxifen tolerating that well without side effect. She specifically denies breast tenderness cough or bone pain.  COMPLICATIONS OF TREATMENT: none  FOLLOW UP COMPLIANCE: keeps appointments   PHYSICAL EXAM:  BP 129/78 mmHg  Pulse 79  Temp(Src) 97.2 F (36.2 C)  Resp 20  Ht $R'5\' 3"'YI$  (1.6 m)  Wt 221 lb 3.7 oz (100.35 kg)  BMI 39.20 kg/m2  LMP 09/20/2015 Lungs are clear to A&P cardiac examination essentially unremarkable with regular rate and rhythm. No dominant mass or nodularity is noted in either breast in 2 positions examined. Incision is well-healed. No axillary or supraclavicular adenopathy is appreciated. Cosmetic result is excellent. Well-developed well-nourished patient in NAD. HEENT reveals PERLA, EOMI, discs not visualized.  Oral cavity is clear. No oral mucosal lesions are identified. Neck is clear without evidence of cervical or supraclavicular adenopathy. Lungs are clear to A&P. Cardiac examination is essentially unremarkable with regular rate and rhythm without murmur rub or thrill. Abdomen is benign with no organomegaly or masses noted. Motor sensory and DTR levels are equal and symmetric in the upper and lower extremities. Cranial nerves II through XII are grossly intact. Proprioception is intact. No peripheral adenopathy or edema is identified. No motor or sensory levels are  noted. Crude visual fields are within normal range.  RADIOLOGY RESULTS: No current mammograms for review  PLAN: Present time patient is recovering nicely from her radiation therapy treatments. I'm please were overall progress. I've asked to see her back in 4-5 months for follow-up. She continues on tamoxifen without side effect. Patient knows to call with any concerns.  I would like to take this opportunity for allowing me to participate in the care of your patient.Armstead Peaks., MD

## 2015-12-01 ENCOUNTER — Encounter
Admission: AD | Disposition: A | Discharge status: Home / Self Care | Payer: Self-pay | Source: Ambulatory Visit | Attending: Obstetrics and Gynecology

## 2015-12-01 ENCOUNTER — Ambulatory Visit: Payer: Managed Care, Other (non HMO) | Admitting: Anesthesiology

## 2015-12-01 ENCOUNTER — Encounter: Payer: Self-pay | Admitting: *Deleted

## 2015-12-01 ENCOUNTER — Inpatient Hospital Stay
Admission: AD | Admit: 2015-12-01 | Discharge: 2015-12-03 | DRG: 743 | Disposition: A | Discharge status: Home / Self Care | Payer: Managed Care, Other (non HMO) | Source: Ambulatory Visit | Attending: Obstetrics and Gynecology | Admitting: Obstetrics and Gynecology

## 2015-12-01 DIAGNOSIS — Z79810 Long term (current) use of selective estrogen receptor modulators (SERMs): Secondary | ICD-10-CM | POA: Diagnosis not present

## 2015-12-01 DIAGNOSIS — Z170 Estrogen receptor positive status [ER+]: Secondary | ICD-10-CM | POA: Diagnosis not present

## 2015-12-01 DIAGNOSIS — N871 Moderate cervical dysplasia: Secondary | ICD-10-CM | POA: Diagnosis present

## 2015-12-01 DIAGNOSIS — Z9889 Other specified postprocedural states: Secondary | ICD-10-CM

## 2015-12-01 DIAGNOSIS — C50919 Malignant neoplasm of unspecified site of unspecified female breast: Secondary | ICD-10-CM | POA: Diagnosis present

## 2015-12-01 DIAGNOSIS — F17210 Nicotine dependence, cigarettes, uncomplicated: Secondary | ICD-10-CM | POA: Diagnosis present

## 2015-12-01 HISTORY — PX: ABDOMINAL HYSTERECTOMY: SHX81

## 2015-12-01 HISTORY — PX: LAPAROSCOPIC VAGINAL HYSTERECTOMY WITH SALPINGO OOPHORECTOMY: SHX6681

## 2015-12-01 LAB — CBC
HCT: 34 % — ABNORMAL LOW (ref 35.0–47.0)
HEMOGLOBIN: 11.2 g/dL — AB (ref 12.0–16.0)
MCH: 29.8 pg (ref 26.0–34.0)
MCHC: 32.9 g/dL (ref 32.0–36.0)
MCV: 90.8 fL (ref 80.0–100.0)
PLATELETS: 207 10*3/uL (ref 150–440)
RBC: 3.75 MIL/uL — AB (ref 3.80–5.20)
RDW: 15.3 % — ABNORMAL HIGH (ref 11.5–14.5)
WBC: 13.4 10*3/uL — AB (ref 3.6–11.0)

## 2015-12-01 LAB — BASIC METABOLIC PANEL
ANION GAP: 6 (ref 5–15)
BUN: 11 mg/dL (ref 6–20)
CHLORIDE: 107 mmol/L (ref 101–111)
CO2: 25 mmol/L (ref 22–32)
Calcium: 8.2 mg/dL — ABNORMAL LOW (ref 8.9–10.3)
Creatinine, Ser: 0.65 mg/dL (ref 0.44–1.00)
Glucose, Bld: 170 mg/dL — ABNORMAL HIGH (ref 65–99)
POTASSIUM: 4.2 mmol/L (ref 3.5–5.1)
SODIUM: 138 mmol/L (ref 135–145)

## 2015-12-01 LAB — HEMOGLOBIN AND HEMATOCRIT, BLOOD
HCT: 34.6 % — ABNORMAL LOW (ref 35.0–47.0)
Hemoglobin: 11.2 g/dL — ABNORMAL LOW (ref 12.0–16.0)

## 2015-12-01 LAB — PREPARE RBC (CROSSMATCH)

## 2015-12-01 LAB — POCT PREGNANCY, URINE: Preg Test, Ur: NEGATIVE

## 2015-12-01 SURGERY — HYSTERECTOMY, VAGINAL, LAPAROSCOPY-ASSISTED, WITH SALPINGO-OOPHORECTOMY
Anesthesia: General | Laterality: Bilateral

## 2015-12-01 MED ORDER — SUGAMMADEX SODIUM 200 MG/2ML IV SOLN
INTRAVENOUS | Status: DC | PRN
  Administered 2015-12-01: 200 mg via INTRAVENOUS

## 2015-12-01 MED ORDER — NALOXONE HCL 0.4 MG/ML IJ SOLN: 0.4000 mg | INTRAMUSCULAR | Status: DC | PRN

## 2015-12-01 MED ORDER — ONDANSETRON HCL 4 MG/2ML IJ SOLN
INTRAMUSCULAR | Status: DC | PRN
  Administered 2015-12-01: 4 mg via INTRAVENOUS

## 2015-12-01 MED ORDER — LACTATED RINGERS IV SOLN
INTRAVENOUS | Status: DC
  Administered 2015-12-01 (×4): via INTRAVENOUS

## 2015-12-01 MED ORDER — ONDANSETRON HCL 4 MG/2ML IJ SOLN: 4.0000 mg | Freq: Once | INTRAMUSCULAR | Status: DC | PRN

## 2015-12-01 MED ORDER — OXYCODONE-ACETAMINOPHEN 5-325 MG PO TABS
1.0000 | ORAL_TABLET | ORAL | Status: DC | PRN
  Administered 2015-12-02 (×3): 2 via ORAL
  Administered 2015-12-03 (×3): 1 via ORAL
  Filled 2015-12-01 (×2): qty 2
  Filled 2015-12-01 (×3): qty 1
  Filled 2015-12-01: qty 2

## 2015-12-01 MED ORDER — SODIUM CHLORIDE 0.9 % IR SOLN
Status: DC | PRN
  Administered 2015-12-01: 200 mL via INTRAVESICAL

## 2015-12-01 MED ORDER — TAMOXIFEN CITRATE 10 MG PO TABS
20.0000 mg | ORAL_TABLET | Freq: Every day | ORAL | Status: DC
  Administered 2015-12-03: 20 mg via ORAL
  Filled 2015-12-01 (×4): qty 2

## 2015-12-01 MED ORDER — TAMOXIFEN CITRATE 10 MG PO TABS
20.0000 mg | ORAL_TABLET | Freq: Every day | ORAL | Status: DC
  Filled 2015-12-01: qty 2

## 2015-12-01 MED ORDER — FAMOTIDINE 20 MG PO TABS
ORAL_TABLET | ORAL | Status: AC
  Administered 2015-12-01: 20 mg via ORAL
  Filled 2015-12-01: qty 1

## 2015-12-01 MED ORDER — GLYCOPYRROLATE 0.2 MG/ML IJ SOLN
INTRAMUSCULAR | Status: DC | PRN
  Administered 2015-12-01: .1 mg via INTRAVENOUS

## 2015-12-01 MED ORDER — DIPHENHYDRAMINE HCL 50 MG/ML IJ SOLN
12.5000 mg | Freq: Four times a day (QID) | INTRAMUSCULAR | Status: DC | PRN
Start: 2015-12-01 — End: 2015-12-02

## 2015-12-01 MED ORDER — LACTATED RINGERS IV SOLN: INTRAVENOUS | Status: DC

## 2015-12-01 MED ORDER — MIDAZOLAM HCL 2 MG/2ML IJ SOLN
INTRAMUSCULAR | Status: DC | PRN
  Administered 2015-12-01: 1 mg via INTRAVENOUS

## 2015-12-01 MED ORDER — IBUPROFEN 600 MG PO TABS
600.0000 mg | ORAL_TABLET | Freq: Four times a day (QID) | ORAL | Status: DC | PRN
  Administered 2015-12-02 – 2015-12-03 (×5): 600 mg via ORAL
  Filled 2015-12-01 (×5): qty 1

## 2015-12-01 MED ORDER — KETOROLAC TROMETHAMINE 30 MG/ML IJ SOLN
INTRAMUSCULAR | Status: AC
  Administered 2015-12-01: 30 mg via INTRAVENOUS
  Filled 2015-12-01: qty 1

## 2015-12-01 MED ORDER — FENTANYL CITRATE (PF) 100 MCG/2ML IJ SOLN: 25.0000 ug | INTRAMUSCULAR | Status: DC | PRN

## 2015-12-01 MED ORDER — CEFAZOLIN SODIUM-DEXTROSE 2-3 GM-% IV SOLR: 2.0000 g | INTRAVENOUS | Status: DC

## 2015-12-01 MED ORDER — ROCURONIUM BROMIDE 100 MG/10ML IV SOLN
INTRAVENOUS | Status: DC | PRN
  Administered 2015-12-01: 5 mg via INTRAVENOUS
  Administered 2015-12-01: 15 mg via INTRAVENOUS
  Administered 2015-12-01 (×2): 20 mg via INTRAVENOUS
  Administered 2015-12-01 (×2): 10 mg via INTRAVENOUS

## 2015-12-01 MED ORDER — THROMBIN 5000 UNITS EX SOLR
CUTANEOUS | Status: DC | PRN
  Administered 2015-12-01: 5000 [IU] via TOPICAL

## 2015-12-01 MED ORDER — SUCCINYLCHOLINE CHLORIDE 20 MG/ML IJ SOLN
INTRAMUSCULAR | Status: DC | PRN
  Administered 2015-12-01: 80 mg via INTRAVENOUS

## 2015-12-01 MED ORDER — SODIUM CHLORIDE 0.9 % IJ SOLN: 9.0000 mL | INTRAMUSCULAR | Status: DC | PRN

## 2015-12-01 MED ORDER — FLUORESCEIN SODIUM 10 % IJ SOLN
INTRAMUSCULAR | Status: DC | PRN
  Administered 2015-12-01: 300 mg via INTRAVENOUS

## 2015-12-01 MED ORDER — LACTATED RINGERS IV SOLN
INTRAVENOUS | Status: DC | PRN
  Administered 2015-12-01: 150 via INTRAVENOUS

## 2015-12-01 MED ORDER — HYDROMORPHONE 1 MG/ML IV SOLN
INTRAVENOUS | Status: DC
  Administered 2015-12-01: 18:00:00 via INTRAVENOUS
  Filled 2015-12-01: qty 25

## 2015-12-01 MED ORDER — BUPIVACAINE HCL (PF) 0.5 % IJ SOLN
INTRAMUSCULAR | Status: DC | PRN
  Administered 2015-12-01: 10 mL

## 2015-12-01 MED ORDER — FAMOTIDINE 20 MG PO TABS
20.0000 mg | ORAL_TABLET | Freq: Once | ORAL | Status: AC
Start: 2015-12-01 — End: 2015-12-01
  Administered 2015-12-01: 20 mg via ORAL

## 2015-12-01 MED ORDER — FENTANYL CITRATE (PF) 100 MCG/2ML IJ SOLN
INTRAMUSCULAR | Status: DC | PRN
  Administered 2015-12-01 (×3): 50 ug via INTRAVENOUS
  Administered 2015-12-01: 100 ug via INTRAVENOUS

## 2015-12-01 MED ORDER — MENTHOL 3 MG MT LOZG: 1.0000 | LOZENGE | OROMUCOSAL | Status: DC | PRN

## 2015-12-01 MED ORDER — DEXAMETHASONE SODIUM PHOSPHATE 10 MG/ML IJ SOLN
INTRAMUSCULAR | Status: DC | PRN
  Administered 2015-12-01: 5 mg via INTRAVENOUS

## 2015-12-01 MED ORDER — THROMBIN 5000 UNITS EX SOLR
CUTANEOUS | Status: AC
  Filled 2015-12-01: qty 5000

## 2015-12-01 MED ORDER — GUAIFENESIN ER 600 MG PO TB12
600.0000 mg | ORAL_TABLET | Freq: Two times a day (BID) | ORAL | Status: DC | PRN
  Administered 2015-12-01: 600 mg via ORAL
  Filled 2015-12-01 (×2): qty 1

## 2015-12-01 MED ORDER — 0.9 % SODIUM CHLORIDE (POUR BTL) OPTIME
TOPICAL | Status: DC | PRN
  Administered 2015-12-01: 1000 mL

## 2015-12-01 MED ORDER — LIDOCAINE HCL (CARDIAC) 20 MG/ML IV SOLN
INTRAVENOUS | Status: DC | PRN
  Administered 2015-12-01: 30 mg via INTRAVENOUS

## 2015-12-01 MED ORDER — ADULT MULTIVITAMIN W/MINERALS CH
1.0000 | ORAL_TABLET | Freq: Every day | ORAL | Status: DC
  Administered 2015-12-02: 1 via ORAL
  Filled 2015-12-01 (×4): qty 1

## 2015-12-01 MED ORDER — PROPOFOL 10 MG/ML IV BOLUS
INTRAVENOUS | Status: DC | PRN
  Administered 2015-12-01: 170 mg via INTRAVENOUS

## 2015-12-01 MED ORDER — LACTATED RINGERS IV SOLN
INTRAVENOUS | Status: DC
  Administered 2015-12-01 – 2015-12-02 (×2): via INTRAVENOUS

## 2015-12-01 MED ORDER — DIPHENHYDRAMINE HCL 12.5 MG/5ML PO ELIX: 12.5000 mg | ORAL_SOLUTION | Freq: Four times a day (QID) | ORAL | Status: DC | PRN

## 2015-12-01 MED ORDER — FLUORESCEIN SODIUM 10 % IJ SOLN
INTRAMUSCULAR | Status: AC
  Filled 2015-12-01: qty 5

## 2015-12-01 MED ORDER — KETOROLAC TROMETHAMINE 30 MG/ML IJ SOLN
30.0000 mg | Freq: Once | INTRAMUSCULAR | Status: AC
  Administered 2015-12-01: 30 mg via INTRAVENOUS

## 2015-12-01 MED ORDER — CEFAZOLIN SODIUM-DEXTROSE 2-3 GM-% IV SOLR
INTRAVENOUS | Status: AC
  Administered 2015-12-01 (×2): 2 g via INTRAVENOUS
  Filled 2015-12-01: qty 50

## 2015-12-01 MED ORDER — BUPIVACAINE HCL (PF) 0.5 % IJ SOLN
INTRAMUSCULAR | Status: AC
  Filled 2015-12-01: qty 30

## 2015-12-01 MED ORDER — ONDANSETRON HCL 4 MG/2ML IJ SOLN: 4.0000 mg | Freq: Four times a day (QID) | INTRAMUSCULAR | Status: DC | PRN

## 2015-12-01 MED ORDER — DOCUSATE SODIUM 100 MG PO CAPS
100.0000 mg | ORAL_CAPSULE | Freq: Two times a day (BID) | ORAL | Status: DC
  Administered 2015-12-01 – 2015-12-03 (×5): 100 mg via ORAL
  Filled 2015-12-01 (×5): qty 1

## 2015-12-01 SURGICAL SUPPLY — 64 items
APPLIER CLIP LOGIC TI 5 (MISCELLANEOUS) ×4 IMPLANT
BAG COUNTER SPONGE EZ (MISCELLANEOUS) ×9 IMPLANT
BAG URO DRAIN 2000ML W/SPOUT (MISCELLANEOUS) ×4 IMPLANT
BLADE SURG 10 STRL SS SAFETY (BLADE) ×8 IMPLANT
CANISTER SUCT 1200ML W/VALVE (MISCELLANEOUS) ×4 IMPLANT
CANISTER SUCT 3000ML PPV (MISCELLANEOUS) ×4 IMPLANT
CATH FOLEY 2WAY  5CC 16FR (CATHETERS) ×2
CATH URTH 16FR FL 2W BLN LF (CATHETERS) ×2 IMPLANT
CLOSURE WOUND 1/2 X4 (GAUZE/BANDAGES/DRESSINGS) ×1
COUNTER SPONGE BAG EZ (MISCELLANEOUS) ×3
DRAPE SHEET LG 3/4 BI-LAMINATE (DRAPES) ×4 IMPLANT
DRAPE SURG 17X11 SM STRL (DRAPES) ×4 IMPLANT
DRSG OPSITE POSTOP 4X10 (GAUZE/BANDAGES/DRESSINGS) ×4 IMPLANT
ELECT BLADE 6.5 EXT (BLADE) ×4 IMPLANT
FILTER LAP SMOKE EVAC STRL (MISCELLANEOUS) ×4 IMPLANT
GAUZE PACK 2X3YD (MISCELLANEOUS) ×4 IMPLANT
GLOVE BIO SURGEON STRL SZ 6.5 (GLOVE) ×3 IMPLANT
GLOVE BIO SURGEON STRL SZ7 (GLOVE) ×4 IMPLANT
GLOVE BIO SURGEONS STRL SZ 6.5 (GLOVE) ×1
GLOVE INDICATOR 7.0 STRL GRN (GLOVE) ×4 IMPLANT
GLOVE INDICATOR 7.5 STRL GRN (GLOVE) ×4 IMPLANT
GOWN STRL REUS W/ TWL LRG LVL3 (GOWN DISPOSABLE) ×8 IMPLANT
GOWN STRL REUS W/ TWL XL LVL3 (GOWN DISPOSABLE) ×2 IMPLANT
GOWN STRL REUS W/TWL LRG LVL3 (GOWN DISPOSABLE) ×8
GOWN STRL REUS W/TWL XL LVL3 (GOWN DISPOSABLE) ×2
HANDLE YANKAUER SUCT BULB TIP (MISCELLANEOUS) ×4 IMPLANT
INSERT TIB FXD SZ3 14M (MISCELLANEOUS) ×4 IMPLANT
IRRIGATION STRYKERFLOW (MISCELLANEOUS) ×2 IMPLANT
IRRIGATOR STRYKERFLOW (MISCELLANEOUS) ×4
IV LACTATED RINGERS 1000ML (IV SOLUTION) ×4 IMPLANT
KIT RM TURNOVER CYSTO AR (KITS) ×4 IMPLANT
LABEL OR SOLS (LABEL) ×4 IMPLANT
LIGASURE BLUNT 5MM 37CM (INSTRUMENTS) ×4 IMPLANT
NDL SAFETY 22GX1.5 (NEEDLE) ×4 IMPLANT
NS IRRIG 500ML POUR BTL (IV SOLUTION) ×12 IMPLANT
PACK BASIN MINOR ARMC (MISCELLANEOUS) ×4 IMPLANT
PACK GYN LAPAROSCOPIC (MISCELLANEOUS) ×4 IMPLANT
PAD GROUND ADULT SPLIT (MISCELLANEOUS) ×4 IMPLANT
PAD OB MATERNITY 4.3X12.25 (PERSONAL CARE ITEMS) ×4 IMPLANT
PAD PREP 24X41 OB/GYN DISP (PERSONAL CARE ITEMS) ×4 IMPLANT
PENCIL ELECTRO HAND CTR (MISCELLANEOUS) ×4 IMPLANT
SET CYSTO W/LG BORE CLAMP LF (SET/KITS/TRAYS/PACK) ×8 IMPLANT
SHEARS HARMONIC ACE PLUS 36CM (ENDOMECHANICALS) ×4 IMPLANT
SLEEVE ENDOPATH XCEL 5M (ENDOMECHANICALS) ×8 IMPLANT
SLEEVE PROTECTION STRL DISP (MISCELLANEOUS) ×4 IMPLANT
SPOGE SURGIFLO 8M (HEMOSTASIS) ×2
SPONGE LAP 18X18 5 PK (GAUZE/BANDAGES/DRESSINGS) ×12 IMPLANT
SPONGE SURGIFLO 8M (HEMOSTASIS) ×2 IMPLANT
SPONGE XRAY 4X4 16PLY STRL (MISCELLANEOUS) ×4 IMPLANT
STAPLER SKIN PROX 35W (STAPLE) ×4 IMPLANT
STRIP CLOSURE SKIN 1/2X4 (GAUZE/BANDAGES/DRESSINGS) ×3 IMPLANT
SUT PDS AB 2-0 CT1 27 (SUTURE) ×4 IMPLANT
SUT VIC AB 0 CT1 27 (SUTURE) ×6
SUT VIC AB 0 CT1 27XCR 8 STRN (SUTURE) ×6 IMPLANT
SUT VIC AB 0 CT1 36 (SUTURE) ×16 IMPLANT
SUT VIC AB 2-0 SH 27 (SUTURE) ×4
SUT VIC AB 2-0 SH 27XBRD (SUTURE) ×4 IMPLANT
SUT VIC AB 2-0 UR6 27 (SUTURE) ×8 IMPLANT
SUT VIC AB 4-0 FS2 27 (SUTURE) ×4 IMPLANT
SYR CONTROL 10ML (SYRINGE) ×4 IMPLANT
SYRINGE 10CC LL (SYRINGE) ×4 IMPLANT
TROCAR ENDO BLADELESS 11MM (ENDOMECHANICALS) ×4 IMPLANT
TROCAR XCEL NON-BLD 5MMX100MML (ENDOMECHANICALS) ×4 IMPLANT
TUBING INSUFFLATOR HEATED (MISCELLANEOUS) ×4 IMPLANT

## 2015-12-01 NOTE — Interval H&P Note (Signed)
History and Physical Interval Note:  12/01/2015 10:25 AM  Paula [name]  has presented today for surgery, with the diagnosis of persistent cervical dysplasia  The various methods of treatment have been discussed with the patient and family. After consideration of risks, benefits and other options for treatment, the patient has consented to  Procedure(s): LAPAROSCOPIC ASSISTED VAGINAL HYSTERECTOMY WITH SALPINGO OOPHORECTOMY (Bilateral) as a surgical intervention .  The patient's history has been reviewed, patient examined, no change in status, stable for surgery.  I have reviewed the patient's chart and labs.  Questions were answered to the patient's satisfaction.     Benjaman Kindler

## 2015-12-01 NOTE — Op Note (Signed)
Paula [name] PROCEDURE DATE: 12/01/2015   PREOPERATIVE DIAGNOSIS:  1. Persistent high-grade cervical dysplasia 2. Estrogen receptor positive breast cancer on tamoxifen 3. Tobacco abuse 4. No uterine descent on exam  POSTOPERATIVE DIAGNOSIS: 1. Persistent high-grade cervical dysplasia 2. Estrogen receptor positive breast cancer on tamoxifen 3. Intrapelvic adhesions   PROCEDURE: Exam under anesthesia, Laparoscopic assisted vaginal hysterectomy converted to total abdominal hysterectomy, bilateral salpingoophorectomy, cystoscopy  SURGEON:  Dr. Angelina Pih  ASSISTANT: Dr. Gwen Her Schermerhorn  INDICATIONS: 49 y.o. G1P1 with aforementioned preoperative diagnoses here today for definitive surgical management.   Risks of surgery were discussed with the patient including but not limited to: bleeding which may require transfusion or reoperation; infection which may require antibiotics; injury to bowel, bladder, ureters or other surrounding organs; need for additional procedures including laparotomy; thromboembolic phenomenon, incisional problems and other postoperative/anesthesia complications. Written informed consent was obtained.    FINDINGS:  Small uterus, normal adnexa bilaterally.  Significant adhesions between sigmoid colon and left pelvic sidewall. Significant bleeding along all cervical edges. Normal upper abdomen. The patient received a second dose of Ancef after 3 hours and significant blood loss.  ANESTHESIA:    General INTRAVENOUS FLUIDS: 3100 ml ESTIMATED BLOOD LOSS: 850 ml SPECIMENS: Uterus, cervix, bilateral fallopian tubes and ovaries. COMPLICATIONS: Conversion to open hysterectomy  PROCEDURE IN DETAIL:  The patient received intravenous antibiotics and had sequential compression devices applied to her lower extremities while in the preoperative area.  She was then taken to the operating room where general anesthesia was administered and was found to be adequate.  She  was placed in the dorsal lithotomy position, and was prepped and draped in a sterile manner.  A Foley catheter was inserted into her bladder and attached to constant drainage and a uterine manipulator was then advanced into the uterus. After an adequate timeout was performed, attention was turned to the abdomen where an umbilical incision was made with the scalpel.  A 11-mm trocar and sleeve were then advanced without difficulty with the laparoscope under direct visualization into the abdomen.  The abdomen was then insufflated with carbon dioxide gas and adequate pneumoperitoneum was obtained. Bilateral 5-mm lower quadrant ports were then placed under direct visualization.  A survey of the patient's pelvis and abdomen revealed the findings as above.  Pelvic adhesions were ligated along the left pelvic sidewall, and the left ovary freed. The round ligaments were clamped and transected with the Ligasure device on the left side. The bilateral infundibulopelvic ligaments were also clamped and transected with the Ligasure device. Difficulty keeping the bowel out of the surgical field because of adhesions and the amount of bowel was noted, and was only minimally ameliorated by Trendelenburg positioning.   The uterine artery on the left was skeletonized and cauterized and ligated. Significant bleeding along this edge was unable to be controlled with Ligasure, bipolar cautery or 60mm laparoscopic clips. A fourth laproscopic port was placed to assist in retraction and visualization. For safety to the ureter and because of the continuing blood loss of about 580ml, as well as the unusual amount of bleeding from both lateral and medial along the cervix, the decision was made to convert to an open case. A laproscopic grasper was used to hold pressure for hemostasis, and attention was turned to her lower abdomen.   A Pfannensteil skin incision was made. This incision was taken down to the fascia sharply. The fascia was incised  in the midline and the fascial incision was then extended bilaterally  using electrocautery without difficulty. The fascia was then dissected off the underlying rectus muscles using blunt and sharp dissection. The rectus muscles were split bluntly in the midline and the peritoneum entered sharply without complication. This peritoneal incision was then extended superiorly and inferiorly with care given to prevent bowel or bladder injury. Attention was then turned to the pelvis.   A Bookwalter retractor was placed into the incision, and the bowel was packed away with moist laparotomy sponges. Attention was immediately on the site of left sided bleeding and this was controlled with several clamp-cut sutures.The round ligaments on the right side were clamped, suture ligated with 0 Vicryl, and transected with electrocautery allowing entry into the broad ligament. Of note, all sutures used in this procedure are 0 Vicryl unless otherwise noted. The anterior and posterior leaves of the broad ligament were separated, and the ureters were inspected to be safely away from the area of dissection bilaterally.    A hole was created in the clear portion of the posterior broad ligament and the infundibulopelvic ligament clamped on the patient's right side. This pedicle was then clamped, cut, and doubly suture ligated with good hemostasis, salpingoophorectomy.  A bladder flap was then created.  The bladder was then bluntly dissected off the lower uterine segment and cervix with good hemostasis noted. The uterine arteries were then skeletonized on the right, and then clamped, cut, and doubly suture ligated with care given to prevent ureteral injury.  The uterosacral ligaments were then clamped, cut, and ligated bilaterally.  Finally, the cardinal ligaments were clamped, cut, and ligated bilaterally.  Acutely curved clamps were placed across the vagina just under the cervix, and the specimen was amputated and sent to pathology.  The vaginal cuff angles were closed with Heaney stiches with care given to incorporate the uterosacral-cardinal ligament pedicles on both sides. The middle of the vaginal cuff was closed with a series of interrupted figure-of-eight sutures with care given to incorporate the anterior pubocervical fascia and the posterior rectovaginal fascia.     The pelvis was irrigated and hemostasis was assured at all pedicles and along the pelvic sidewall with a mixture of 2-0 vicryl sutures and careful cautery. Several stiches were placed along the dome of the bladder, very superficially.  The ureters were inspected and noted to be peristalsing bilaterally along the pelvic sidewall.    SurgiFlo was placed along the surgical sites.  All laparotomy sponges and instruments were removed from the abdomen. The peritoneum was closed with a running stitch, and the fascia was also closed in a running fashion. The subcutaneous layer was reapproximated with 2-0 plain gut. The skin was closed with surgical staples.   The Foley catheter was temporarily removed and an uncomplicated cystoscopy was performed. 800 mL of fluid was instilled for hydro-distention, drained, and the bladder mucosa was visualized again with the above findings. Excellent efflux was noted from both ureteral orifices.  Sponge, lap, needle, and instrument counts were correct times two. The patient was taken to the recovery area awake, extubated and in stable condition.

## 2015-12-01 NOTE — Anesthesia Postprocedure Evaluation (Signed)
Anesthesia Post Note  Patient: CHESLEIGH RANKER  Procedure(s) Performed: Procedure(s) (LRB): LAPAROSCOPIC ASSISTED VAGINAL HYSTERECTOMY WITH SALPINGO OOPHORECTOMY-converted to (Bilateral) total abdominal hysterectomty, bilateral salpingo- oopharectomy, cystoscopy   Patient location during evaluation: PACU Anesthesia Type: General Level of consciousness: awake and alert Pain management: pain level controlled Vital Signs Assessment: post-procedure vital signs reviewed and stable Respiratory status: spontaneous breathing, nonlabored ventilation, respiratory function stable and patient connected to nasal cannula oxygen Cardiovascular status: blood pressure returned to baseline and stable Postop Assessment: no signs of nausea or vomiting Anesthetic complications: no    Last Vitals:  Filed Vitals:   12/01/15 1741 12/01/15 1842  BP:  118/69  Pulse:  94  Temp:  36.8 C  Resp: 18 20    Last Pain:  Filed Vitals:   12/01/15 1845  PainSc: 5                  Martha Clan

## 2015-12-01 NOTE — Transfer of Care (Signed)
Immediate Anesthesia Transfer of Care Note  Patient: Paula [name]  Procedure(s) Performed: Procedure(s): LAPAROSCOPIC ASSISTED VAGINAL HYSTERECTOMY WITH SALPINGO OOPHORECTOMY-converted to (Bilateral) total abdominal hysterectomty, bilateral salpingo- oopharectomy, cystoscopy   Patient Location: PACU  Anesthesia Type:General  Level of Consciousness: awake  Airway & Oxygen Therapy: Patient Spontanous Breathing and Patient connected to face mask oxygen  Post-op Assessment: Report given to RN and Post -op Vital signs reviewed and stable  Post vital signs: stable  Last Vitals:  Filed Vitals:   12/01/15 0807 12/01/15 1613  BP: 123/72 134/52  Pulse: 89 97  Temp: 36.7 C 37 C  Resp: 18 18    Complications: No apparent anesthesia complications

## 2015-12-01 NOTE — Progress Notes (Signed)
Pt requested Mucinex for cough. Dr. Ouida Sills paged and order entered for Mucinex. Pharmacy called to confirm safety of taking Mucinex and Tamoxifen. Pharmacist checked Micromedex and second source to confirm no drug interactions.

## 2015-12-01 NOTE — Anesthesia Preprocedure Evaluation (Signed)
Anesthesia Evaluation  Patient identified by MRN, date of birth, ID band Patient awake    Reviewed: Allergy & Precautions, H&P , NPO status , Patient's Chart, lab work & pertinent test results, reviewed documented beta blocker date and time   Airway Mallampati: II  TM Distance: >3 FB Neck ROM: full    Dental  (+) Teeth Intact   Pulmonary neg pulmonary ROS, pneumonia, resolved, Current Smoker,    Pulmonary exam normal        Cardiovascular negative cardio ROS Normal cardiovascular exam Rhythm:regular Rate:Normal     Neuro/Psych negative neurological ROS  negative psych ROS   GI/Hepatic negative GI ROS, Neg liver ROS,   Endo/Other  negative endocrine ROS  Renal/GU negative Renal ROS  negative genitourinary   Musculoskeletal   Abdominal   Peds  Hematology negative hematology ROS (+)   Anesthesia Other Findings   Reproductive/Obstetrics negative OB ROS                             Anesthesia Physical Anesthesia Plan  ASA: II  Anesthesia Plan: General ETT   Post-op Pain Management:    Induction:   Airway Management Planned:   Additional Equipment:   Intra-op Plan:   Post-operative Plan:   Informed Consent: I have reviewed the patients History and Physical, chart, labs and discussed the procedure including the risks, benefits and alternatives for the proposed anesthesia with the patient or authorized representative who has indicated his/her understanding and acceptance.     Plan Discussed with: CRNA  Anesthesia Plan Comments:         Anesthesia Quick Evaluation

## 2015-12-01 NOTE — Anesthesia Procedure Notes (Signed)
Date/Time: 12/01/2015 12:03 PM Performed by: Kennon Holter Pre-anesthesia Checklist: Timeout performed, Patient being monitored, Suction available, Emergency Drugs available and Patient identified Patient Re-evaluated:Patient Re-evaluated prior to inductionOxygen Delivery Method: Circle system utilized Preoxygenation: Pre-oxygenation with 100% oxygen Intubation Type: IV induction Ventilation: Mask ventilation without difficulty Laryngoscope Size: Mac and 3 Grade View: Grade I Tube type: Oral Tube size: 7.0 mm Airway Equipment and Method: Stylet Placement Confirmation: ETT inserted through vocal cords under direct vision,  positive ETCO2 and breath sounds checked- equal and bilateral Secured at: 21.5 cm Dental Injury: Teeth and Oropharynx as per pre-operative assessment

## 2015-12-02 ENCOUNTER — Encounter: Payer: Self-pay | Admitting: Obstetrics and Gynecology

## 2015-12-02 NOTE — Progress Notes (Signed)
Paula Crigler, RN verified with Dr Leafy Ro the patients medication order for Tamoxifen. The patient wants to take her own medication and Dr Leafy Ro said that is "perfectly fine and she is ok with her taking her own medication". Paula [name] sent the patients medication down to pharmacy to be verified, however since our pharmacy has this medication in house they are unable to verify the patients medication. Jawanna, gave the medication back to the patient and told her that Dr Leafy Ro is ok with her taking her own medication.

## 2015-12-02 NOTE — Progress Notes (Signed)
1 Day Post-Op Procedure(s) (LRB): LAPAROSCOPIC ASSISTED VAGINAL HYSTERECTOMY WITH SALPINGO OOPHORECTOMY-converted to (Bilateral) total abdominal hysterectomty, bilateral salpingo- oopharectomy, cystoscopy   Subjective: Patient reports incisional pain.    Objective: I have reviewed patient's vital signs, intake and output and labs.  General: alert, cooperative and no distress Resp: clear to auscultation bilaterally Cardio: regular rate and rhythm, S1, S2 normal, no murmur, click, rub or gallop GI: soft, non-tender; bowel sounds normal; no masses,  no organomegaly Extremities: extremities normal, atraumatic, no cyanosis or edema Vaginal Bleeding: minimal  Assessment: s/p Procedure(s): LAPAROSCOPIC ASSISTED VAGINAL HYSTERECTOMY WITH SALPINGO OOPHORECTOMY-converted to (Bilateral) total abdominal hysterectomty, bilateral salpingo- oopharectomy, cystoscopy : stable, progressing well and tolerating diet  Plan: Advance diet Encourage ambulation Advance to PO medication D/C Foley and PCA  LOS: 1 day    Paula [name] 12/02/2015, 9:05 AM

## 2015-12-03 ENCOUNTER — Ambulatory Visit: Payer: Self-pay | Admitting: General Surgery

## 2015-12-03 LAB — SURGICAL PATHOLOGY

## 2015-12-03 LAB — TYPE AND SCREEN
ABO/RH(D): A POS
ANTIBODY SCREEN: NEGATIVE
UNIT DIVISION: 0
Unit division: 0

## 2015-12-03 MED ORDER — IBUPROFEN 600 MG PO TABS: 600.0000 mg | ORAL_TABLET | Freq: Four times a day (QID) | ORAL | Status: DC | PRN

## 2015-12-03 MED ORDER — OXYCODONE-ACETAMINOPHEN 5-325 MG PO TABS: 1.0000 | ORAL_TABLET | ORAL | Status: DC | PRN

## 2015-12-03 MED ORDER — DOCUSATE SODIUM 100 MG PO CAPS: 100.0000 mg | ORAL_CAPSULE | Freq: Two times a day (BID) | ORAL | Status: DC

## 2015-12-03 MED ORDER — GUAIFENESIN ER 600 MG PO TB12: 600.0000 mg | ORAL_TABLET | Freq: Two times a day (BID) | ORAL | Status: DC | PRN

## 2015-12-03 NOTE — Progress Notes (Signed)
Discharge instructions reviewed with patient and prescriptions given to patient. Patient verbalized understanding.

## 2015-12-03 NOTE — Discharge Summary (Signed)
Physician Discharge Summary  Patient ID: Paula [name] MRN: [id number] DOB/AGE: [birth year] 49 y.o.  Admit date: 12/01/2015 Discharge date: 12/03/2015  Admission Diagnoses:  Discharge Diagnoses:  Active Problems:   Postoperative state   CIN II (cervical intraepithelial neoplasia II)   Discharged Condition: good  Hospital Course: POD#2 from open TAH, BSO for persistent high grade cervical dysplasia with tobacco abuse, and estrogen receptor positive breast cancer on SERMs. LAVH started and converted to TAH. Pt did remarkably well postoperatively. She advanced her diet to regular po and on day of discharge, she was ambulating without difficulty, voiding spontaneously and her pain was controlled with po meds. She requested discharge. I will see her back in the office tomorrow to remove staples and for wound check.  Consults: None  Significant Diagnostic Studies:  Labs= POD#1 WBC 13.4 and Hgb 11.2, changed from 6.0 and 13.0  Discharge Exam: Blood pressure 114/62, pulse 80, temperature 98.7 F (37.1 C), temperature source Oral, resp. rate 18, height 5\' 3"  (1.6 m), weight 100.245 kg (221 lb), SpO2 96 %. General appearance: alert, cooperative and no distress Chest wall: no tenderness Cardio: regular rate and rhythm, S1, S2 normal, no murmur, click, rub or gallop GI: soft, non-tender; bowel sounds normal; no masses,  no organomegaly Extremities: extremities normal, atraumatic, no cyanosis or edema Pulses: 2+ and symmetric Skin: Skin color, texture, turgor normal. No rashes or lesions Neurologic: Grossly normal Incision/Wound: c/d/i- staples intact, no evidence of cellulitis  Disposition: 01-Home or Self Care     Medication List    TAKE these medications        docusate sodium 100 MG capsule  Commonly known as:  COLACE  Take 1 capsule (100 mg total) by mouth 2 (two) times daily.     guaiFENesin 600 MG 12 hr tablet  Commonly known as:  MUCINEX  Take 1 tablet (600 mg total) by  mouth 2 (two) times daily as needed for cough.     ibuprofen 600 MG tablet  Commonly known as:  ADVIL,MOTRIN  Take 1 tablet (600 mg total) by mouth every 6 (six) hours as needed (mild pain).     MULTIVITAMIN ADULT Chew  Chew 2 Doses by mouth daily.     naproxen sodium 220 MG tablet  Commonly known as:  ANAPROX  Take 220 mg by mouth 2 (two) times daily with a meal.     oxyCODONE-acetaminophen 5-325 MG tablet  Commonly known as:  PERCOCET/ROXICET  Take 1-2 tablets by mouth every 4 (four) hours as needed (moderate to severe pain (when tolerating fluids)).     tamoxifen 20 MG tablet  Commonly known as:  NOLVADEX  Take 1 tablet (20 mg total) by mouth daily.       Follow-up Information    Follow up with Benjaman Kindler, MD In 1 day.   Specialty:  Obstetrics and Gynecology   Why:  For staple removal   Contact information:   [place] [address] [phone]       Follow up with Benjaman Kindler, MD In 2 weeks.   Specialty:  Obstetrics and Gynecology   Why:  For postop check   Contact information:   [place] [address] [phone]       Signed: Benjaman Kindler 12/03/2015, 6:31 PM

## 2015-12-03 NOTE — Discharge Instructions (Signed)
Signs and Symptoms to Report Call our office at [phone] if you have any of the following.   Fever over 100.4 degrees or higher  Severe stomach pain not relieved with pain medications  Bright red bleeding thats heavier than a period that does not slow with rest  To go the bathroom a lot (frequency), you cant hold your urine (urgency), or it hurts when you empty your bladder (urinate)  Chest pain  Shortness of breath  Pain in the calves of your legs  Severe nausea and vomiting not relieved with anti-nausea medications  Signs of infection around your wounds, such as redness, hot to touch, swelling, green/yellow drainage (like pus), bad smelling discharge  Any concerns  What You Can Expect after Surgery  You may see some pink tinged, bloody fluid and bruising around the wound. This is normal.  You may have a sore throat because of the tube in your mouth during general anesthesia. This will go away in 2 to 3 days.  You may have some stomach cramps.  You may notice spotting on your panties.  You may have pain around the incision sites.   Activities after Your Discharge Follow these guidelines to help speed your recovery at home:  Do the coughing and deep breathing as you did in the hospital for 2 weeks. Use the small blue breathing device, called the incentive spirometer for 2 weeks.  Dont drive if you are in pain or taking narcotic pain medicine. You may drive when you can safely slam on the brakes, turn the wheel forcefully, and rotate your torso comfortably. This is typically 1-2 weeks. Practice in a parking lot or side street prior to attempting to drive regularly.   Ask others to help with household chores for 4 weeks.  Do not lift anything heavier that 10 pounds for 4-6 weeks. This includes pets, children, and groceries.  Dont do strenuous activities, exercises, or sports like vacuuming, tennis, squash, etc. until your doctor says it is safe to do so. ---If  you had a hysterectomy (abdominal, laparoscopic, or vaginal) do not have intercourse for 8-10 weeks.   Walk as you feel able. Rest often since it may take two or three weeks for your energy level to return to normal.   You may climb stairs  Avoid constipation:   -Eat fruits, vegetables, and whole grains. Eat small meals as your appetite will take time to return to normal.   -Drink 6 to 8 glasses of water each day unless your doctor has told you to limit your fluids.   -Use a laxative or stool softener as needed if constipation becomes a problem. You may take Miralax, metamucil, Citrucil, Colace, Senekot, FiberCon, etc. If this does not relieve the constipation, try two tablespoons of Milk Of Magnesia every 8 hours until your bowels move.   You may shower. Gently wash the wounds with a mild soap and water. Pat dry.  Do not get in a hot tub, swimming pool, etc. until your doctor agrees.  Do not use lotions, oils, powders on the wounds.  Do not douche, use tampons, or have sex until your doctor says it is okay.  Take your pain medicine when you need it. The medicine may not work as well if the pain is bad.  Take the medicines you were taking before surgery. Other medications you will need are pain medications (Norco or Percocet) and nausea medications (Zofran). You can take ibuprofen, which I have prescribed, for cramping pain (but  don't take it AND the naproxen you have at home. Either one will work).

## 2015-12-11 ENCOUNTER — Encounter: Payer: Self-pay | Admitting: General Surgery

## 2015-12-11 ENCOUNTER — Ambulatory Visit (INDEPENDENT_AMBULATORY_CARE_PROVIDER_SITE_OTHER): Payer: Managed Care, Other (non HMO) | Admitting: General Surgery

## 2015-12-11 VITALS — BP 120/74 | HR 80 | Resp 12 | Ht 63.0 in | Wt 220.0 lb

## 2015-12-11 DIAGNOSIS — C50211 Malignant neoplasm of upper-inner quadrant of right female breast: Secondary | ICD-10-CM

## 2015-12-11 NOTE — Progress Notes (Signed)
Patient ID: YUNUEN MORDAN, female   DOB: [birth year], 49 y.o.   MRN: [id number]  Chief Complaint  Patient presents with  . Follow-up    breast cancer    HPI Paula [name] is a 49 y.o. female here today for her three month follow up from breast cancer. Patient states Paula [name] had a hysteromyotomy done on 12/01/15.   I personally reviewed Paula [name]. HPI  Past Medical [name]  Diagnosis Date  . STD (female)   . HPV in female 2016  . Dog bite of left lower leg 03/07/15  . Pneumonia 02/2015  . BRCA negative 2016  . Breast cancer ([place]) 05/28/15    pT1c,N1a:2/3 SLN positive for macrometastisi,ER positive, PR positive, HER-2/neu not overexpressed    Past Surgical [name]  Procedure Laterality Date  . Colposcopy  05/28/15    High grade squamous intraepithelial lesion  . Breast surgery Right 05/28/15  . Wisdom tooth extraction    . Breast lumpectomy with sentinel lymph node biopsy Right 07/17/2015    Procedure: RIGHT BREAST WIDE EXCISION WITH SENTINEL NODE BIOPSY;  Surgeon: Robert Bellow, MD;  Location: [facility] ORS;  Service: General;  Laterality: Right;  . Laparoscopic vaginal hysterectomy with salpingo oophorectomy Bilateral 12/01/2015    Procedure: LAPAROSCOPIC ASSISTED VAGINAL HYSTERECTOMY WITH SALPINGO OOPHORECTOMY-converted to;  Surgeon: Benjaman Kindler, MD;  Location: [facility] ORS;  Service: Gynecology;  Laterality: Bilateral;  . Abdominal hysterectomy  12/01/2015    Procedure: total abdominal hysterectomty, bilateral salpingo- oopharectomy, cystoscopy ;  Surgeon: Benjaman Kindler, MD;  Location: [facility] ORS;  Service: Gynecology;;    Family [name]  Problem Relation Age of Onset  . Cancer Maternal Aunt 63    breast    Social [name] Social [name]  Substance Use Topics  . Smoking status: Current Every Day Smoker -- 1.00 packs/day for 36 years    Types: Cigarettes  . Smokeless tobacco: Never Used  . Alcohol Use: 0.0 oz/week    0 Standard drinks or equivalent per week      Comment: occ    No Known Allergies  Current Outpatient Prescriptions  Medication Sig Dispense Refill  . docusate sodium (COLACE) 100 MG capsule Take 1 capsule (100 mg total) by mouth 2 (two) times daily. 10 capsule 0  . guaiFENesin (MUCINEX) 600 MG 12 hr tablet Take 1 tablet (600 mg total) by mouth 2 (two) times daily as needed for cough. 30 tablet 1  . ibuprofen (ADVIL,MOTRIN) 600 MG tablet Take 1 tablet (600 mg total) by mouth every 6 (six) hours as needed (mild pain). 30 tablet 0  . Multiple Vitamins-Minerals (MULTIVITAMIN ADULT) CHEW Chew 2 Doses by mouth daily.    . naproxen sodium (ANAPROX) 220 MG tablet Take 220 mg by mouth 2 (two) times daily with a meal.    . oxyCODONE-acetaminophen (PERCOCET/ROXICET) 5-325 MG tablet Take 1-2 tablets by mouth every 4 (four) hours as needed (moderate to severe pain (when tolerating fluids)). 30 tablet 0  . tamoxifen (NOLVADEX) 20 MG tablet Take 1 tablet (20 mg total) by mouth daily. 30 tablet 6   No current facility-administered medications for this visit.    Review of Systems Review of Systems  Constitutional: Negative.   Respiratory: Negative.   Cardiovascular: Negative.     Blood pressure 120/74, pulse 80, resp. rate 12, height '5\' 3"'$  (1.6 m), weight 220 lb (99.791 kg), last menstrual period 09/20/2015.  Physical Exam Physical Exam  Constitutional: Paula [name] is oriented to person, place, and time. Paula [name] appears well-developed and  well-nourished.  Eyes: Conjunctivae are normal. No scleral icterus.  Neck: Neck supple.  Cardiovascular: Normal rate and normal heart sounds.   Pulmonary/Chest: Effort normal and breath sounds normal. Right breast exhibits no inverted nipple, no mass, no nipple discharge, no skin change and no tenderness. Left breast exhibits no inverted nipple, no mass, no nipple discharge, no skin change and no tenderness.    Right breast lumpectomy site is clean and well healed at 3 o'clock. moderate volume lost on right .   Abdominal: Soft. Bowel sounds are normal. There is no tenderness.  Lymphadenopathy:    Paula [name] has no cervical adenopathy.  Neurological: Paula [name] is alert and oriented to person, place, and time.  Skin: Skin is warm and dry.    Data Reviewed DIAGNOSIS:  A. UTERUS WITH CERVIX; HYSTERECTOMY:  - CERVIX WITH FOCAL RESIDUAL LOW-GRADE SQUAMOUS INTRAEPITHELIAL LESION  (CIN 1).  - INACTIVE ENDOMETRIUM.  - MYOMETRIUM WITHOUT PATHOLOGIC CHANGES.   BILATERAL FALLOPIAN TUBES AND OVARIES; SALPINGO-OOPHORECTOMY:  - OVARIES WITHOUT PATHOLOGIC CHANGES.  - PARATUBAL CYST AND CYSTIC WALTHARD NESTS    Assessment    Doing well status post breast conservation.  Status post bilateral ovary removal, candidate for transition from tamoxifen to an aromatase inhibitor.    Plan    [place] patient will be seeing Dr. Oliva Bustard in [place] near future. He will likely transition her to an aromatase inhibitor now that Paula [name] is ovary free.   [place] patient has been asked to return to [place] office in six months with a bilateral diagnostic mammogram. PCP:  [place]  This information has been scribed by Gaspar Cola CMA.    Robert Bellow 12/12/2015, 1:24 PM

## 2015-12-12 ENCOUNTER — Encounter: Payer: Self-pay | Admitting: General Surgery

## 2016-01-20 ENCOUNTER — Inpatient Hospital Stay: Payer: Managed Care, Other (non HMO) | Attending: Oncology | Admitting: Oncology

## 2016-01-20 ENCOUNTER — Inpatient Hospital Stay: Payer: Managed Care, Other (non HMO)

## 2016-01-20 ENCOUNTER — Encounter: Payer: Self-pay | Admitting: Oncology

## 2016-01-20 VITALS — BP 120/78 | HR 80 | Temp 97.9°F | Resp 18 | Wt 228.1 lb

## 2016-01-20 DIAGNOSIS — E669 Obesity, unspecified: Secondary | ICD-10-CM | POA: Diagnosis not present

## 2016-01-20 DIAGNOSIS — F17210 Nicotine dependence, cigarettes, uncomplicated: Secondary | ICD-10-CM | POA: Diagnosis not present

## 2016-01-20 DIAGNOSIS — Z90722 Acquired absence of ovaries, bilateral: Secondary | ICD-10-CM | POA: Insufficient documentation

## 2016-01-20 DIAGNOSIS — Z8619 Personal history of other infectious and parasitic diseases: Secondary | ICD-10-CM | POA: Diagnosis not present

## 2016-01-20 DIAGNOSIS — Z170 Estrogen receptor positive status [ER+]: Secondary | ICD-10-CM | POA: Insufficient documentation

## 2016-01-20 DIAGNOSIS — C50411 Malignant neoplasm of upper-outer quadrant of right female breast: Secondary | ICD-10-CM | POA: Insufficient documentation

## 2016-01-20 DIAGNOSIS — R232 Flushing: Secondary | ICD-10-CM

## 2016-01-20 DIAGNOSIS — C50211 Malignant neoplasm of upper-inner quadrant of right female breast: Secondary | ICD-10-CM

## 2016-01-20 DIAGNOSIS — Z90710 Acquired absence of both cervix and uterus: Secondary | ICD-10-CM | POA: Diagnosis not present

## 2016-01-20 DIAGNOSIS — Z923 Personal history of irradiation: Secondary | ICD-10-CM | POA: Diagnosis not present

## 2016-01-20 DIAGNOSIS — Z8701 Personal history of pneumonia (recurrent): Secondary | ICD-10-CM | POA: Insufficient documentation

## 2016-01-20 DIAGNOSIS — C50911 Malignant neoplasm of unspecified site of right female breast: Secondary | ICD-10-CM

## 2016-01-20 DIAGNOSIS — Z79899 Other long term (current) drug therapy: Secondary | ICD-10-CM | POA: Insufficient documentation

## 2016-01-20 DIAGNOSIS — Z79810 Long term (current) use of selective estrogen receptor modulators (SERMs): Secondary | ICD-10-CM | POA: Diagnosis not present

## 2016-01-20 LAB — CBC WITH DIFFERENTIAL/PLATELET
BASOS ABS: 0.1 10*3/uL (ref 0–0.1)
BASOS PCT: 1 %
EOS ABS: 0.2 10*3/uL (ref 0–0.7)
Eosinophils Relative: 3 %
HEMATOCRIT: 36.5 % (ref 35.0–47.0)
HEMOGLOBIN: 12.1 g/dL (ref 12.0–16.0)
Lymphocytes Relative: 23 %
Lymphs Abs: 1.5 10*3/uL (ref 1.0–3.6)
MCH: 29.6 pg (ref 26.0–34.0)
MCHC: 33.1 g/dL (ref 32.0–36.0)
MCV: 89.5 fL (ref 80.0–100.0)
MONOS PCT: 7 %
Monocytes Absolute: 0.4 10*3/uL (ref 0.2–0.9)
NEUTROS ABS: 4.3 10*3/uL (ref 1.4–6.5)
Neutrophils Relative %: 66 %
Platelets: 258 10*3/uL (ref 150–440)
RBC: 4.08 MIL/uL (ref 3.80–5.20)
RDW: 15.8 % — ABNORMAL HIGH (ref 11.5–14.5)
WBC: 6.5 10*3/uL (ref 3.6–11.0)

## 2016-01-20 LAB — COMPREHENSIVE METABOLIC PANEL
ALBUMIN: 3.5 g/dL (ref 3.5–5.0)
ALK PHOS: 66 U/L (ref 38–126)
ALT: 17 U/L (ref 14–54)
ANION GAP: 6 (ref 5–15)
AST: 15 U/L (ref 15–41)
BILIRUBIN TOTAL: 0.3 mg/dL (ref 0.3–1.2)
BUN: 11 mg/dL (ref 6–20)
CALCIUM: 8.8 mg/dL — AB (ref 8.9–10.3)
CO2: 26 mmol/L (ref 22–32)
Chloride: 104 mmol/L (ref 101–111)
Creatinine, Ser: 0.7 mg/dL (ref 0.44–1.00)
GFR calc Af Amer: >60 mL/min (ref >60)
GFR calc non Af Amer: >60 mL/min (ref >60)
GLUCOSE: 116 mg/dL — AB (ref 65–99)
POTASSIUM: 4 mmol/L (ref 3.5–5.1)
SODIUM: 136 mmol/L (ref 135–145)
Total Protein: 7.2 g/dL (ref 6.5–8.1)

## 2016-01-20 NOTE — Progress Notes (Signed)
[place] @ [facility] Telephone:(336) [phone]  Fax:(336) [place]: 28-May-1966  MR#: [id number]  JYN#:[id number]  Patient Care Team: Gearldine Shown, DO as PCP - General (Family Medicine) Robert Bellow, MD (General Surgery) Gearldine Shown, DO as Referring Physician (Family Medicine)  CHIEF COMPLAINT:   70.  50 year old lady with a history of carcinoma of breast status post lumpectomy (right breast) T1 cN1 M0 estrogen and progesterone receptor positive HER-2/neu receptor negative stage II disease diagnosis in July of 2016 It is post lumpectomy and sentinel lymph node evaluation to positive lymph node macro metastases with disease.  2.  BRCA: No significant mutation observed.  (August of 2016). 3.  Multiple gene analysis by mammoprint shows low risk (August, 2016)\ 4.  Patient started radiation therapy.  (August, 2016) 5.Patient has finished radiation therapy in October of 2016  6.Patient is starting tamoxifen 20 mg by mouth daily.  7.  Some had a bilateral oophorectomy and hysterectomy on December of 2016 VISIT DIAGNOSIS:  Carcinoma of right breast  No history exists.    Oncology Flowsheet 07/17/2015 12/01/2015 12/03/2015  dexamethasone (DECADRON) IV - - -  ondansetron (ZOFRAN) IV - - -  tamoxifen (NOLVADEX) PO - - 20 mg    INTERVAL HISTORY:  patient is a 50 year old  Moderately obese  female who presented with an abnormal mammogram of her left breast showing a 1.9 cm irregular spiculated mass located in the upper inner quadrant of the right breast at the 2:30 position. She also had another complex cyst measuring 2.3 cm within the right breast at the 10:00 position both were biopsiedand lesion at the 2:30 position was positive for invasive mammary carcinoma. Lesion at 10:00 position showed no evidence of malignancy. She then underwent a wide local excision and sentinel node biopsy.tumor measured 2.0 cm margins were close but  negative for malignancy at 1 mm.  Patient is here for ongoing evaluation and treatment consideration.  Underwent bilateral oophorectomy and hysterectomy. Patient has a hot flashes. Pathology report has been reviewed and essentially negative for any malignancy   Shirlean Kelly, RN Registered Nurse Sign at close encounter  Service date: 01/20/2016 10:28 AM    Expand All Collapse All    Patient states she is having hot flashes and night sweats as well as joint pain.       PAST MEDICAL HISTORY: Past Medical History  Diagnosis Date  . STD (female)   . HPV in female 2016  . Dog bite of left lower leg 03/07/15  . Pneumonia 02/2015  . BRCA negative 2016  . Breast cancer ([place]) 05/28/15    pT1c,N1a:2/3 SLN positive for macrometastisi,ER positive, PR positive, HER-2/neu not overexpressed    PAST SURGICAL HISTORY: Past Surgical History  Procedure Laterality Date  . Colposcopy  05/28/15    High grade squamous intraepithelial lesion  . Breast surgery Right 05/28/15  . Wisdom tooth extraction    . Breast lumpectomy with sentinel lymph node biopsy Right 07/17/2015    Procedure: RIGHT BREAST WIDE EXCISION WITH SENTINEL NODE BIOPSY;  Surgeon: Robert Bellow, MD;  Location: [facility];  Service: General;  Laterality: Right;  . Laparoscopic vaginal hysterectomy with salpingo oophorectomy Bilateral 12/01/2015    Procedure: LAPAROSCOPIC ASSISTED VAGINAL HYSTERECTOMY WITH SALPINGO OOPHORECTOMY-converted to;  Surgeon: Benjaman Kindler, MD;  Location: [facility] OR[facility];  Service: Gynecology;  Laterality: Bilateral;  . Abdominal hysterectomy  12/01/2015    Procedure: total abdominal hysterectomty, bilateral salpingo- oopharectomy, cystoscopy ;  Surgeon: Benjaman Kindler, MD;  Location: [facility] ORS;  Service: Gynecology;;    FAMILY HISTORY Family History  Problem Relation Age of Onset  . Cancer Maternal Aunt 50    breast        ADVANCED DIRECTIVES:  Patient does not have any living will or healthcare power  of attorney.  Information was given .  Available resources had been discussed.  We will follow-up on subsequent appointments regarding this issue  HEALTH MAINTENANCE: Social History  Substance Use Topics  . Smoking status: Current Every Day Smoker -- 1.00 packs/day for 36 years    Types: Cigarettes  . Smokeless tobacco: Never Used  . Alcohol Use: 0.0 oz/week    0 Standard drinks or equivalent per week     Comment: occ       No Known Allergies  Current Outpatient Prescriptions  Medication Sig Dispense Refill  . ibuprofen (ADVIL,MOTRIN) 600 MG tablet Take 1 tablet (600 mg total) by mouth every 6 (six) hours as needed (mild pain). 30 tablet 0  . Multiple Vitamins-Minerals (MULTIVITAMIN ADULT) CHEW Chew 2 Doses by mouth daily.    . naproxen sodium (ANAPROX) 220 MG tablet Take 220 mg by mouth 2 (two) times daily with a meal.    . oxyCODONE-acetaminophen (PERCOCET/ROXICET) 5-325 MG tablet Take 1-2 tablets by mouth every 4 (four) hours as needed (moderate to severe pain (when tolerating fluids)). 30 tablet 0  . tamoxifen (NOLVADEX) 20 MG tablet Take 1 tablet (20 mg total) by mouth daily. 30 tablet 6   No current facility-administered medications for this visit.    OBJECTIVE: PHYSICAL EXAM: Gen. status: Patient is moderately  Obese with a BMI of 39.  Slightly apprehensive but not any acute distress  Head exam was generally normal. There was no scleral icterus or corneal arcus. Mucous membranes were moist. Examination of the skin revealed no evidence of significant rashes, suspicious appearing nevi or other concerning lesions. Lymphatic system: Supraclavicular, cervical, axillary, inguinal lymph nodes are not palpable  Cardiac exam revealed the PMI to be normally situated and sized. The rhythm was regular and no extrasystoles were noted during several minutes of auscultation. The first and second heart sounds were normal and physiologic splitting of the second heart sound was noted. There  were no murmurs, rubs, clicks, or gallops Examination of the chest was unremarkable. There were no bony deformities, no asymmetry, and no other abnormalities. Examination of right breast shows radiation changes lab breast free of masses Abdominal exam revealed normal bowel sounds. The abdomen was soft, non-tender, and without masses, organomegaly, or appreciable enlargement of the abdominal aorta. Neurologically, the patient was awake, alert, and oriented to person, place and time. There were no obvious focal neurologic abnormalities. Musculoskeletal system without any significant problem Your extremity trace edema. Examination of both breasts: Right breast there are radiation changes.  No palpable lymph node.  Left breast there is on outer side lower and outer quadrant and there is some thickening.  Being followed by Dr. Bufford Lope Vitals:   01/20/16 1026  BP: 120/78  Pulse: 137  Temp: 97.9 F (36.6 C)  Resp: 18     Body mass index is 40.42 kg/(m^2).    ECOG FS:1 - Symptomatic but completely ambulatory  LAB RESULts June 03, 2015    Ref Range 44moago    CA 27.29 0.0 - 38.6 U/mL 15.2         Pathology has been reviewed.   STUDIES: No results found.  ASSESSMENT:  1  Carcinoma of right breast T1 cN1 M0 tumor stage II estrogen receptor positive progesterone receptor positive HER-2/neu receptor negative. Status post lumpectomy and sentinel lymph node evaluation   PLAN:   Patient has finished radiation therapy tolerated treatment very well has some radiation changes Possibility of tamoxifen is being considered.  As patient is premenopausal Patient will be started on tamoxifen 20 mg by mouth daily 2.  It is post bilateral oophorectomy and hysterectomy. 3.  Hot flashes secondary to tamoxifen There is a small palpable thickening in the left breast in lower and outer, order and which is being followed by October Burnett with ultrasound.  According to patient's had multiple  cyst  I discussed situation regarding overall available study with [facility]. andvresearch nurses contacting her Discuss that issue also with the research nurse and will coordinate care.. Duration of visit is 25 minutes and 50% of time was spent discussing VARIOUS  options and within research nurse.   No matching staging information was found for the patient.  Forest Gleason, MD   01/20/2016 10:45 AM

## 2016-01-20 NOTE — Progress Notes (Signed)
Patient states she is having hot flashes and night sweats as well as joint pain.

## 2016-01-29 ENCOUNTER — Telehealth: Payer: Self-pay | Admitting: *Deleted

## 2016-01-29 NOTE — Telephone Encounter (Signed)
T/C made to Paula [name] to inform of her return appointment next week to see Dr. Oliva Bustard, have study labs collected, see the research nurse and complete required study questionnaires. Patient confirms date and time of appointment, then questions what she should do when she gets here. Instructed her that myself or a member of the research team will meet her when she arrives and walk her through the processes. She questions whether she will have a co-pay for her visit and was instructed that she will this time. Informed that she can call me at any time if she has any additional questions. Patient already has my contact information. [place] [name], BSN, MHA, OCN 01/29/2016 1:51 PM

## 2016-02-04 ENCOUNTER — Encounter: Payer: Self-pay | Admitting: Oncology

## 2016-02-04 ENCOUNTER — Inpatient Hospital Stay: Payer: Managed Care, Other (non HMO) | Attending: Oncology

## 2016-02-04 ENCOUNTER — Inpatient Hospital Stay (HOSPITAL_BASED_OUTPATIENT_CLINIC_OR_DEPARTMENT_OTHER): Payer: Managed Care, Other (non HMO) | Admitting: Oncology

## 2016-02-04 VITALS — BP 121/81 | HR 82 | Temp 98.3°F | Resp 18 | Ht 63.0 in | Wt 232.1 lb

## 2016-02-04 DIAGNOSIS — Z90710 Acquired absence of both cervix and uterus: Secondary | ICD-10-CM | POA: Insufficient documentation

## 2016-02-04 DIAGNOSIS — C779 Secondary and unspecified malignant neoplasm of lymph node, unspecified: Secondary | ICD-10-CM | POA: Insufficient documentation

## 2016-02-04 DIAGNOSIS — C50211 Malignant neoplasm of upper-inner quadrant of right female breast: Secondary | ICD-10-CM | POA: Diagnosis not present

## 2016-02-04 DIAGNOSIS — E8941 Symptomatic postprocedural ovarian failure: Secondary | ICD-10-CM

## 2016-02-04 DIAGNOSIS — Z79899 Other long term (current) drug therapy: Secondary | ICD-10-CM | POA: Diagnosis not present

## 2016-02-04 DIAGNOSIS — F17210 Nicotine dependence, cigarettes, uncomplicated: Secondary | ICD-10-CM | POA: Insufficient documentation

## 2016-02-04 DIAGNOSIS — E669 Obesity, unspecified: Secondary | ICD-10-CM

## 2016-02-04 DIAGNOSIS — Z006 Encounter for examination for normal comparison and control in clinical research program: Secondary | ICD-10-CM | POA: Diagnosis not present

## 2016-02-04 DIAGNOSIS — Z79810 Long term (current) use of selective estrogen receptor modulators (SERMs): Secondary | ICD-10-CM | POA: Insufficient documentation

## 2016-02-04 DIAGNOSIS — Z923 Personal history of irradiation: Secondary | ICD-10-CM | POA: Diagnosis not present

## 2016-02-04 DIAGNOSIS — Z90722 Acquired absence of ovaries, bilateral: Secondary | ICD-10-CM | POA: Diagnosis not present

## 2016-02-04 DIAGNOSIS — Z170 Estrogen receptor positive status [ER+]: Secondary | ICD-10-CM

## 2016-02-04 DIAGNOSIS — Z8701 Personal history of pneumonia (recurrent): Secondary | ICD-10-CM | POA: Diagnosis not present

## 2016-02-04 LAB — CBC WITH DIFFERENTIAL/PLATELET
BASOS ABS: 0.1 10*3/uL (ref 0–0.1)
BASOS PCT: 1 %
EOS ABS: 0.1 10*3/uL (ref 0–0.7)
EOS PCT: 2 %
HEMATOCRIT: 35.2 % (ref 35.0–47.0)
Hemoglobin: 11.8 g/dL — ABNORMAL LOW (ref 12.0–16.0)
Lymphocytes Relative: 22 %
Lymphs Abs: 1.7 10*3/uL (ref 1.0–3.6)
MCH: 29.8 pg (ref 26.0–34.0)
MCHC: 33.7 g/dL (ref 32.0–36.0)
MCV: 88.5 fL (ref 80.0–100.0)
MONO ABS: 0.5 10*3/uL (ref 0.2–0.9)
MONOS PCT: 6 %
NEUTROS ABS: 5.2 10*3/uL (ref 1.4–6.5)
Neutrophils Relative %: 69 %
PLATELETS: 263 10*3/uL (ref 150–440)
RBC: 3.98 MIL/uL (ref 3.80–5.20)
RDW: 16.4 % — AB (ref 11.5–14.5)
WBC: 7.6 10*3/uL (ref 3.6–11.0)

## 2016-02-04 LAB — COMPREHENSIVE METABOLIC PANEL
ALBUMIN: 3.5 g/dL (ref 3.5–5.0)
ALT: 17 U/L (ref 14–54)
ANION GAP: 4 — AB (ref 5–15)
AST: 16 U/L (ref 15–41)
Alkaline Phosphatase: 61 U/L (ref 38–126)
BILIRUBIN TOTAL: 0.3 mg/dL (ref 0.3–1.2)
BUN: 11 mg/dL (ref 6–20)
CHLORIDE: 107 mmol/L (ref 101–111)
CO2: 27 mmol/L (ref 22–32)
Calcium: 8.7 mg/dL — ABNORMAL LOW (ref 8.9–10.3)
Creatinine, Ser: 0.91 mg/dL (ref 0.44–1.00)
GFR calc Af Amer: >60 mL/min (ref >60)
GFR calc non Af Amer: >60 mL/min (ref >60)
GLUCOSE: 134 mg/dL — AB (ref 65–99)
POTASSIUM: 4.3 mmol/L (ref 3.5–5.1)
Sodium: 138 mmol/L (ref 135–145)
TOTAL PROTEIN: 7 g/dL (ref 6.5–8.1)

## 2016-02-04 NOTE — Progress Notes (Signed)
In preparation for patient visit today for PALLAS AFT-05 research study, central lab kit was taken to cancer center laboratory of 02/03/2016.  Verbally informed Collene Mares, Loralie Champagne, Doni [place] and Lucina Mellow of kit being placed in lab and need for draw for whole blood.  Written instructions also placed in central lab bag with EDTA tube.

## 2016-02-04 NOTE — Progress Notes (Signed)
[place] @ [facility] Telephone:(336) [phone]  Fax:(336) [place]: 11-02-1966  MR#: [id number]  MWU#:[id number]  Patient Care Team: Gearldine Shown, DO as PCP - General (Family Medicine) Robert Bellow, MD (General Surgery) Gearldine Shown, DO as Referring Physician (Family Medicine)  CHIEF COMPLAINT:   75.  50 year old lady with a history of carcinoma of breast status post lumpectomy (right breast) T1 cN1 M0 estrogen and progesterone receptor positive HER-2/neu receptor negative stage II disease diagnosis in July of 2016 It is post lumpectomy and sentinel lymph node evaluation to positive lymph node macro metastases with disease.  2.  BRCA: No significant mutation observed.  (August of 2016). 3.  Multiple gene analysis by mammoprint shows low risk (August, 2016)\ 4.  Patient started radiation therapy.  (August, 2016) 5.Patient has finished radiation therapy in October of 2016  6.Patient is starting tamoxifen 20 mg by mouth daily. Patient had bilateral oophorectomy and hysterectomy Recently patient has signed up for   Our  [facility]ALLAS D.  (February, 2017)  7.  Some had a bilateral oophorectomy and hysterectomy on December of 2016 VISIT DIAGNOSIS:  Carcinoma of right breast  No history exists.    Oncology Flowsheet 07/17/2015 12/01/2015 12/03/2015  dexamethasone (DECADRON) IV - - -  ondansetron (ZOFRAN) IV - - -  tamoxifen (NOLVADEX) PO - - 20 mg    INTERVAL HISTORY:  patient is a 50 year old  Moderately obese  female who presented with an abnormal mammogram of her left breast showing a 1.9 cm irregular spiculated mass located in the upper inner quadrant of the right breast at the 2:30 position. She also had another complex cyst measuring 2.3 cm within the right breast at the 10:00 position both were biopsiedand lesion at the 2:30 position was positive for invasive mammary carcinoma. Lesion at 10:00 position showed no evidence  of malignancy. She then underwent a wide local excision and sentinel node biopsy.tumor measured 2.0 cm margins were close but negative for malignancy at 1 mm.  Patient is here for ongoing evaluation and treatment consideration.  Underwent bilateral oophorectomy and hysterectomy. Patient has a hot flashes. Pathology report has been reviewed and essentially negative for any malignancy   Review of systems                  GENERAL:  Feels good.  Active.  No fevers, sweats or weight loss. PERFORMANCE STATUS (ECOG):  0 HEENT:  No visual changes, runny nose, sore throat, mouth sores or tenderness. Lungs: No shortness of breath or cough.  No hemoptysis. Cardiac:  No chest pain, palpitations, orthopnea, or PND. GI:  No nausea, vomiting, diarrhea, constipation, melena or hematochezia. GU:  No urgency, frequency, dysuria, or hematuria. Hot flashes Musculoskeletal:  No back pain.  No joint pain.  No muscle tenderness. Extremities:  No pain or swelling. Skin:  No rashes or skin changes. Neuro:  No headache, numbness or weakness, balance or coordination issues. Endocrine:  No diabetes, thyroid issues, hot flashes or night sweats. Psych:  No mood changes, depression or anxiety. Pain:  No focal pain. Review of systems:  All other systems reviewed and found to be negative.  PAST MEDICAL HISTORY: Past Medical History  Diagnosis Date  . STD (female)   . HPV in female 2016  . Dog bite of left lower leg 03/07/15  . Pneumonia 02/2015  . BRCA negative 2016  . Breast cancer ([place]) 05/28/15    pT1c,N1a:2/3 SLN positive for macrometastisi,ER  positive, PR positive, HER-2/neu not overexpressed    PAST SURGICAL HISTORY: Past Surgical History  Procedure Laterality Date  . Colposcopy  05/28/15    High grade squamous intraepithelial lesion  . Breast surgery Right 05/28/15  . Wisdom tooth extraction    . Breast lumpectomy with sentinel lymph node biopsy Right 07/17/2015    Procedure: RIGHT BREAST WIDE  EXCISION WITH SENTINEL NODE BIOPSY;  Surgeon: Robert Bellow, MD;  Location: [facility] ORS;  Service: General;  Laterality: Right;  . Laparoscopic vaginal hysterectomy with salpingo oophorectomy Bilateral 12/01/2015    Procedure: LAPAROSCOPIC ASSISTED VAGINAL HYSTERECTOMY WITH SALPINGO OOPHORECTOMY-converted to;  Surgeon: Benjaman Kindler, MD;  Location: [facility] ORS;  Service: Gynecology;  Laterality: Bilateral;  . Abdominal hysterectomy  12/01/2015    Procedure: total abdominal hysterectomty, bilateral salpingo- oopharectomy, cystoscopy ;  Surgeon: Benjaman Kindler, MD;  Location: [facility] ORS;  Service: Gynecology;;    FAMILY HISTORY Family History  Problem Relation Age of Onset  . Cancer Maternal Aunt 50    breast        ADVANCED DIRECTIVES:  Patient does not have any living will or healthcare power of attorney.  Information was given .  Available resources had been discussed.  We will follow-up on subsequent appointments regarding this issue  HEALTH MAINTENANCE: Social History  Substance Use Topics  . Smoking status: Current Every Day Smoker -- 1.00 packs/day for 36 years    Types: Cigarettes  . Smokeless tobacco: Never Used  . Alcohol Use: 0.0 oz/week    0 Standard drinks or equivalent per week     Comment: occ       No Known Allergies  Current Outpatient Prescriptions  Medication Sig Dispense Refill  . ibuprofen (ADVIL,MOTRIN) 600 MG tablet Take 1 tablet (600 mg total) by mouth every 6 (six) hours as needed (mild pain). 30 tablet 0  . Multiple Vitamins-Minerals (MULTIVITAMIN ADULT) CHEW Chew 2 Doses by mouth daily.    . naproxen sodium (ANAPROX) 220 MG tablet Take 220 mg by mouth 2 (two) times daily with a meal.    . oxyCODONE-acetaminophen (PERCOCET/ROXICET) 5-325 MG tablet Take 1-2 tablets by mouth every 4 (four) hours as needed (moderate to severe pain (when tolerating fluids)). 30 tablet 0  . tamoxifen (NOLVADEX) 20 MG tablet Take 1 tablet (20 mg total) by mouth daily. 30  tablet 6   No current facility-administered medications for this visit.    OBJECTIVE: PHYSICAL EXAM: Gen. status: Patient is moderately  Obese with a BMI of 39.  Slightly apprehensive but not any acute distress  Head exam was generally normal. There was no scleral icterus or corneal arcus. Mucous membranes were moist. Examination of the skin revealed no evidence of significant rashes, suspicious appearing nevi or other concerning lesions. Lymphatic system: Supraclavicular, cervical, axillary, inguinal lymph nodes are not palpable  Cardiac exam revealed the PMI to be normally situated and sized. The rhythm was regular and no extrasystoles were noted during several minutes of auscultation. The first and second heart sounds were normal and physiologic splitting of the second heart sound was noted. There were no murmurs, rubs, clicks, or gallops Examination of the chest was unremarkable. There were no bony deformities, no asymmetry, and no other abnormalities. Examination of right breast shows radiation changes lab breast free of masses Abdominal exam revealed normal bowel sounds. The abdomen was soft, non-tender, and without masses, organomegaly, or appreciable enlargement of the abdominal aorta. Neurologically, the patient was awake, alert, and oriented to person, place and time.  There were no obvious focal neurologic abnormalities. Musculoskeletal system without any significant problem Your extremity trace edema. Examination of both breasts: Right breast there are radiation changes.  No palpable lymph node.  Left breast there is on outer side lower and outer quadrant and there is some thickening.  Being followed by Dr. Bufford Lope Vitals:   02/04/16 1551  BP: 121/81  Pulse: 82  Temp: 98.3 F (36.8 C)  Resp: 18     Body mass index is 41.13 kg/(m^2).    ECOG FS:0 - Asymptomatic  LAB RESULts June 03, 2015    Ref Range 41moago    CA 27.29 0.0 - 38.6 U/mL 15.2         Pathology  has been reviewed.   STUDIES: No results found.  ASSESSMENT:  1  Carcinoma of right breast T1 cN1 M0 tumor stage II estrogen receptor positive progesterone receptor positive HER-2/neu receptor negative. Status post lumpectomy and sentinel lymph node evaluation   PLAN:   Patient has finished radiation therapy tolerated treatment very well has some radiation changes Possibility of tamoxifen is being considered.  As patient is premenopausal Patient will be started on tamoxifen 20 mg by mouth daily 2.  It is post bilateral oophorectomy and hysterectomy. 3.  Hot flashes secondary to tamoxifenAnd bilateral oophorectomy and hysterectomy. After several months of tamoxifen will check patient for menopausal status and he patient is in menopause and then an aromatase inhibitor would be considered. 4.  Patient has decided to proceed with anti-hormonal adjuvant trial with anti-hormonal treatment plus minus IBRANCE.  Appropriate lab data for central lab has been obtained and tissue would be mailed to study had quarter.    No matching staging information was found for the patient.  JForest Gleason MD   02/04/2016 4:35 PM

## 2016-02-05 LAB — HEMOGLOBIN A1C: HEMOGLOBIN A1C: 5.3 % (ref 4.0–6.0)

## 2016-02-10 ENCOUNTER — Inpatient Hospital Stay: Payer: Managed Care, Other (non HMO)

## 2016-02-10 ENCOUNTER — Encounter: Payer: Self-pay | Admitting: *Deleted

## 2016-02-10 DIAGNOSIS — C50211 Malignant neoplasm of upper-inner quadrant of right female breast: Secondary | ICD-10-CM

## 2016-02-10 NOTE — Progress Notes (Signed)
The patient Paula [name] arrived in clinic this morning as scheduled for C1D1 initiation of AFT-05 PALLAS study. The patient had central labs collected, then proceeded to complete the required baseline PRO questionnaires: EORTC QLQ-C30; EORTC QLQ-BR23, Modified Pain Inventory,Brief Fatigue Inventory, BCPT Symptom Scale as well as additional questions. Reviewed current medications with patient and she denies any changes since last reviewed. The H&P, VS, PS, AEs and local labs were all collected on 02/04/16 - which is within 7 days and they do not need to be repeated per protocol. Reviewed the medication diary for patient's Endocrine therapy - currently Tamoxifen, and how to complete this log each day. Patient was provided a copy of the treatment schedule for arm B as well, so she can know in advance what to expect. Instructed that I will call her in 2 weeks to see how she is doing, and at that time we will schedule her return appointment for Cycle 2 with Dr. Oliva Bustard. The patient does not have her work schedule that far in advance, so we can plan the visit at the time of her follow up call. Explained that she will see Dr. Oliva Bustard and have labs and questionnaires every 4 weeks for 8 weeks, then her schedule will change to every 3 months. Ms Hovorka was allowed to ask any questions and contact information for both myself and Mirian Mo, RN was provided in case she has any further questions. Baseline adverse events likely related to Tamoxifen, but could be related to having a total hysterectomy in 11/2015 as follows:    Joint Pain - grade 1; probably r/t Tamoxifen    Hot flashes - grade 1; probably r/t Tamoxifen    Night sweats - grade 1; probably r/t Tamoxifen    Weight gain - grade 1 (>5%); possibly r/t Tamoxifen  [name], BSN, MHA, OCN 02/10/2016 11:55 AM

## 2016-02-24 ENCOUNTER — Telehealth: Payer: Self-pay | Admitting: *Deleted

## 2016-02-24 DIAGNOSIS — C50211 Malignant neoplasm of upper-inner quadrant of right female breast: Secondary | ICD-10-CM

## 2016-02-24 NOTE — Telephone Encounter (Addendum)
C1D14 T/C made to follow up for the AFT-05 PALLAS study. Paula [name] reports she is doing well, but still experiencing some joint pain, which is a known side effect of Tamoxifen. She also reports that she developed a "knot" in her right calf 2 days ago, and this was painful the rest of that day, but much better yesterday. States it is still a little sore, but reports that she has some swelling in her right leg from time-to time and relates all of this to her job. She is working at [employer] and is on her feet for 8 hours per day when she works. Instructed patient that she is also potentially at risk for blood clots and should she experience sharp pain in one leg and not the other, she should call and let Dr. Oliva Bustard know so he can order a doppler study to rule out a blood clot. Also informed that a blood clot would continue to be painful and not likely improve. Will inform Dr. Oliva Bustard of this. Also, Ms. Zora reports she will be off work on 03/08/16 and would like to have her study follow up appointment scheduled on that day. Plan to get C2D1 appointment scheduled and call patient back with appt time. [name], BSN, MHA, OCN 02/24/2016 9:50 AM  Spoke with Dr. Oliva Bustard - who states he will not be working on 03/08/16, but states it will be OK for Dr. Rogue Bussing to see the patient.

## 2016-02-24 NOTE — Telephone Encounter (Signed)
Received message from scheduling that the patient's appointment will be on Monday - 03/08/16 at 9:45 and that she will see Dr. Rogue Bussing that day. T/C made back to patient and message left on her voicemail regarding new appointment date/time. Instructed patient to return call at her convenience to make sure she is aware of date/time of appointment and that she has no additional questions regarding the fact that she will be seeing a different provider. [name], BSN, MHA, OCN 02/24/2016 1:29 PM

## 2016-03-08 ENCOUNTER — Inpatient Hospital Stay: Payer: Managed Care, Other (non HMO) | Attending: Internal Medicine

## 2016-03-08 ENCOUNTER — Encounter: Payer: Self-pay | Admitting: *Deleted

## 2016-03-08 ENCOUNTER — Encounter: Payer: Self-pay | Admitting: Internal Medicine

## 2016-03-08 ENCOUNTER — Inpatient Hospital Stay (HOSPITAL_BASED_OUTPATIENT_CLINIC_OR_DEPARTMENT_OTHER): Payer: Managed Care, Other (non HMO) | Admitting: Internal Medicine

## 2016-03-08 VITALS — BP 126/78 | HR 78 | Temp 97.9°F | Resp 18 | Ht 63.0 in | Wt 233.7 lb

## 2016-03-08 DIAGNOSIS — Z90710 Acquired absence of both cervix and uterus: Secondary | ICD-10-CM | POA: Insufficient documentation

## 2016-03-08 DIAGNOSIS — R252 Cramp and spasm: Secondary | ICD-10-CM | POA: Insufficient documentation

## 2016-03-08 DIAGNOSIS — C50911 Malignant neoplasm of unspecified site of right female breast: Secondary | ICD-10-CM | POA: Diagnosis not present

## 2016-03-08 DIAGNOSIS — C50211 Malignant neoplasm of upper-inner quadrant of right female breast: Secondary | ICD-10-CM

## 2016-03-08 DIAGNOSIS — A630 Anogenital (venereal) warts: Secondary | ICD-10-CM

## 2016-03-08 DIAGNOSIS — Z90722 Acquired absence of ovaries, bilateral: Secondary | ICD-10-CM | POA: Diagnosis not present

## 2016-03-08 DIAGNOSIS — R61 Generalized hyperhidrosis: Secondary | ICD-10-CM

## 2016-03-08 DIAGNOSIS — Z006 Encounter for examination for normal comparison and control in clinical research program: Secondary | ICD-10-CM | POA: Diagnosis not present

## 2016-03-08 DIAGNOSIS — F17210 Nicotine dependence, cigarettes, uncomplicated: Secondary | ICD-10-CM | POA: Insufficient documentation

## 2016-03-08 DIAGNOSIS — Z170 Estrogen receptor positive status [ER+]: Secondary | ICD-10-CM

## 2016-03-08 DIAGNOSIS — Z79810 Long term (current) use of selective estrogen receptor modulators (SERMs): Secondary | ICD-10-CM | POA: Insufficient documentation

## 2016-03-08 LAB — COMPREHENSIVE METABOLIC PANEL
ALBUMIN: 3.6 g/dL (ref 3.5–5.0)
ALK PHOS: 64 U/L (ref 38–126)
ALT: 17 U/L (ref 14–54)
AST: 21 U/L (ref 15–41)
Anion gap: 6 (ref 5–15)
BILIRUBIN TOTAL: 0.3 mg/dL (ref 0.3–1.2)
BUN: 11 mg/dL (ref 6–20)
CALCIUM: 8.8 mg/dL — AB (ref 8.9–10.3)
CO2: 27 mmol/L (ref 22–32)
CREATININE: 0.72 mg/dL (ref 0.44–1.00)
Chloride: 102 mmol/L (ref 101–111)
GFR calc Af Amer: >60 mL/min (ref >60)
GLUCOSE: 117 mg/dL — AB (ref 65–99)
Potassium: 4 mmol/L (ref 3.5–5.1)
Sodium: 135 mmol/L (ref 135–145)
TOTAL PROTEIN: 7 g/dL (ref 6.5–8.1)

## 2016-03-08 LAB — CBC WITH DIFFERENTIAL/PLATELET
BASOS ABS: 0.1 10*3/uL (ref 0–0.1)
BASOS PCT: 1 %
Eosinophils Absolute: 0.1 10*3/uL (ref 0–0.7)
Eosinophils Relative: 2 %
HEMATOCRIT: 36.8 % (ref 35.0–47.0)
HEMOGLOBIN: 12.5 g/dL (ref 12.0–16.0)
LYMPHS PCT: 23 %
Lymphs Abs: 1.4 10*3/uL (ref 1.0–3.6)
MCH: 29.4 pg (ref 26.0–34.0)
MCHC: 34 g/dL (ref 32.0–36.0)
MCV: 86.6 fL (ref 80.0–100.0)
Monocytes Absolute: 0.4 10*3/uL (ref 0.2–0.9)
Monocytes Relative: 6 %
NEUTROS ABS: 4.2 10*3/uL (ref 1.4–6.5)
NEUTROS PCT: 68 %
Platelets: 236 10*3/uL (ref 150–440)
RBC: 4.25 MIL/uL (ref 3.80–5.20)
RDW: 16.5 % — ABNORMAL HIGH (ref 11.5–14.5)
WBC: 6.2 10*3/uL (ref 3.6–11.0)

## 2016-03-08 MED ORDER — VITAMIN D (ERGOCALCIFEROL) 1.25 MG (50000 UNIT) PO CAPS: 50000.0000 [IU] | ORAL_CAPSULE | ORAL | Status: DC

## 2016-03-08 NOTE — Progress Notes (Signed)
[facility] OFFICE PROGRESS NOTE  Patient Care Team: Gearldine Shown, DO as PCP - General (Family Medicine) Robert Bellow, MD (General Surgery) Gearldine Shown, DO as Referring Physician (Family Medicine)   SUMMARY OF ONCOLOGIC HISTORY:  # 2016- BREAST CANCER  STAGE II T1 cN1 M0 [s/p Lumpec & RT]ER/PR- Pos; HER-2/neu receptor negative. Mammaprint- Low risk; no chemo; Oct 2016- START TAM; Rogene [place] study/control arm.   # Dec 2016-  TAH + BSO     INTERVAL HISTORY:  50 year old female patient with above history of stage II breast cancer currently on tamoxifen is here for follow-up. She is also on the control arm of the [place] study.   Patient complains of hot flashes/ night sweats which wakes up from sleep. She also complains of body aches/leg cramps especially after work. Otherwise denies any lumps or bumps. Denies any mood swings. Denies any leg swelling.  Appetite is good. No cough shortness of breath or chest pain.    REVIEW OF SYSTEMS:  A complete 10 point review of system is done which is negative except mentioned above/history of present illness.   PAST MEDICAL HISTORY :  Past Medical History  Diagnosis Date  . STD (female)   . HPV in female 2016  . Dog bite of left lower leg 03/07/15  . Pneumonia 02/2015  . BRCA negative 2016  . Breast cancer ([place]) 05/28/15    pT1c,N1a:2/3 SLN positive for macrometastisi,ER positive, PR positive, HER-2/neu not overexpressed    PAST SURGICAL HISTORY :   Past Surgical History  Procedure Laterality Date  . Colposcopy  05/28/15    High grade squamous intraepithelial lesion  . Breast surgery Right 05/28/15  . Wisdom tooth extraction    . Breast lumpectomy with sentinel lymph node biopsy Right 07/17/2015    Procedure: RIGHT BREAST WIDE EXCISION WITH SENTINEL NODE BIOPSY;  Surgeon: Robert Bellow, MD;  Location: [facility] ORS;  Service: General;  Laterality: Right;  . Laparoscopic vaginal hysterectomy with  salpingo oophorectomy Bilateral 12/01/2015    Procedure: LAPAROSCOPIC ASSISTED VAGINAL HYSTERECTOMY WITH SALPINGO OOPHORECTOMY-converted to;  Surgeon: Benjaman Kindler, MD;  Location: [facility] ORS;  Service: Gynecology;  Laterality: Bilateral;  . Abdominal hysterectomy  12/01/2015    Procedure: total abdominal hysterectomty, bilateral salpingo- oopharectomy, cystoscopy ;  Surgeon: Benjaman Kindler, MD;  Location: [facility] ORS;  Service: Gynecology;;    FAMILY HISTORY :   Family History  Problem Relation Age of Onset  . Cancer Maternal Aunt 25    breast    SOCIAL HISTORY:   Social History  Substance Use Topics  . Smoking status: Current Every Day Smoker -- 1.00 packs/day for 36 years    Types: Cigarettes  . Smokeless tobacco: Never Used  . Alcohol Use: 0.0 oz/week    0 Standard drinks or equivalent per week     Comment: occ    ALLERGIES:  has No Known Allergies.  MEDICATIONS:  Current Outpatient Prescriptions  Medication Sig Dispense Refill  . ibuprofen (ADVIL,MOTRIN) 600 MG tablet Take 1 tablet (600 mg total) by mouth every 6 (six) hours as needed (mild pain). 30 tablet 0  . Multiple Vitamins-Minerals (MULTIVITAMIN ADULT) CHEW Chew 2 Doses by mouth daily.    . naproxen sodium (ANAPROX) 220 MG tablet Take 220 mg by mouth 2 (two) times daily with a meal.    . oxyCODONE-acetaminophen (PERCOCET/ROXICET) 5-325 MG tablet Take 1-2 tablets by mouth every 4 (four) hours as needed (moderate to severe pain (when tolerating fluids)). 30 tablet  0  . tamoxifen (NOLVADEX) 20 MG tablet Take 1 tablet (20 mg total) by mouth daily. 30 tablet 6   No current facility-administered medications for this visit.    PHYSICAL EXAMINATION: ECOG PERFORMANCE STATUS: 0 - Asymptomatic  BP 126/78 mmHg  Pulse 78  Temp(Src) 97.9 F (36.6 C) (Tympanic)  Resp 18  Ht _0  (1.6 m)  Wt 233 lb 11 oz (106 kg)  BMI 41.41 kg/m2  Filed Weights   03/08/16 1052  Weight: 233 lb 11 oz (106 kg)    GENERAL: Well-nourished  well-developed; Alert, no distress and comfortable.   Alone. Obese.  EYES: no pallor or icterus OROPHARYNX: no thrush or ulceration; good dentition  NECK: supple, no masses felt LYMPH:  no palpable lymphadenopathy in the cervical, axillary or inguinal regions LUNGS: clear to auscultation and  No wheeze or crackles HEART/CVS: regular rate & rhythm and no murmurs; No lower extremity edema ABDOMEN:abdomen soft, non-tender and normal bowel sounds Musculoskeletal:no cyanosis of digits and no clubbing  PSYCH: alert & oriented x 3 with fluent speech NEURO: no focal motor/sensory deficits SKIN:  no rashes or significant lesions Right and left BREAST exam [in the presence of nurse]- no unusual skin changes or dominant masses felt. Surgical scars noted.   LABORATORY DATA:  I have reviewed the data as listed    Component Value Date/Time   NA 135 03/08/2016 0946   K 4.0 03/08/2016 0946   CL 102 03/08/2016 0946   CO2 27 03/08/2016 0946   GLUCOSE 117* 03/08/2016 0946   BUN 11 03/08/2016 0946   CREATININE 0.72 03/08/2016 0946   CALCIUM 8.8* 03/08/2016 0946   PROT 7.0 03/08/2016 0946   ALBUMIN 3.6 03/08/2016 0946   AST 21 03/08/2016 0946   ALT 17 03/08/2016 0946   ALKPHOS 64 03/08/2016 0946   BILITOT 0.3 03/08/2016 0946   GFRNONAA >60 03/08/2016 0946   GFRAA >60 03/08/2016 0946    No results found for: SPEP, UPEP  Lab Results  Component Value Date   WBC 6.2 03/08/2016   NEUTROABS 4.2 03/08/2016   HGB 12.5 03/08/2016   HCT 36.8 03/08/2016   MCV 86.6 03/08/2016   PLT 236 03/08/2016      Chemistry      Component Value Date/Time   NA 135 03/08/2016 0946   K 4.0 03/08/2016 0946   CL 102 03/08/2016 0946   CO2 27 03/08/2016 0946   BUN 11 03/08/2016 0946   CREATININE 0.72 03/08/2016 0946      Component Value Date/Time   CALCIUM 8.8* 03/08/2016 0946   ALKPHOS 64 03/08/2016 0946   AST 21 03/08/2016 0946   ALT 17 03/08/2016 0946   BILITOT 0.3 03/08/2016 0946          ASSESSMENT & PLAN:   # Right Stage II Breast ca; ER/PR positive HER-2/neu negative currently on tamoxifen on adjuvant basis. Clinically no evidence of recurrence.  # Muscle cramps and leg cramps- likely from tamoxifen. Grade 1 recommend vitamin D 50,000 units once a week.  # Hot flashes night sweats- mild-to-moderate. Again likely from tamoxifen/ low estrogens state. Discussed regarding Effexor. Patient declined.  # If patient's tamoxifen related side effects continue to be a problem- alternate medications like aromatase inhibitors could be considered. For now continue tamoxifen.  # Patient follow-up with Dr. Oliva Bustard in a month; discussed with clinical trials nurse.  # 15 minutes face-to-face with the patient discussing the above plan of care; more than 50% of time spent on prognosis/  natural history; counseling and coordination.      Cammie Sickle, MD 03/08/2016 11:20 AM

## 2016-03-08 NOTE — Progress Notes (Signed)
Paula [name] returns to clinic this morning for her Cycle 2 Day 1 visit for the AFT-05 PALLAS study. She has returned her med calendar for daily Tamoxifen, and has completed it. Exchanged this for a new calendar. She also completed the PRO and Med adherence questionnaires while in clinic. Paula [name] reports she is experiencing some pain in her joints. She also reports weight gain of about 13 lbs since starting the Tamoxifen and feels this may contribute to her joint pain. Looking back to patient's weight when she initiated Tamoxifen - she has actually gained 14 lbs, or approx. 6.5% of her original weight. She also continues to experience hot flashes maybe once daily and night sweats, which she states occur every night. States the leg cramps in her calves occurred twice in the past month - once in each calf and both times this resolved after 2-3 days each time. Dr. Rogue Bussing has discussed these side effects with patient and offered to put her on Effexor for the hot flashes; which Paula [name] declined. He also offered high doses of vitamin D to help with the leg cramps, and patient is receptive to this. Patient states she does not know what her schedule will be in April, and request that we schedule her next appointment when I contact her by phone in 2 weeks. Tentative C3D1 visit is 04/06/16. [name] [id number]  03/08/2016  Adverse Event Log  Study/Protocol: AFT-05 PALLAS Cycle: 2; Day 1  Event Grade Onset Date Resolved Date Drug Name Attribution Treatment Comments  Joint pain Grade 1 09/2015  Tamoxifen Probably    Hot flashes Grade 1 09/2015  Tamoxifen Probably  Pt declined tx  Night sweats Grade 1 09/2015  Tamoxifen Probably    Muscle cramps Grade 1 02/21/16  Tamoxifen Possible Rx for high dose Vitamin D   Weight gain Grade 1 02/04/16  Tamoxifen Possible    Paula [name], BSN, MHA, OCN 03/08/2016 11:30 AM

## 2016-03-08 NOTE — Progress Notes (Signed)
Pt states joint pain continues, right knee worse than anything.  Always had problem with right knee and right leg swelling.  Now both legs swelling. Muscle cramp charlie horse in left leg and felt know but the muscle cramp was over the knot was not there. She felt like it was the muscle so tightened up.

## 2016-03-18 ENCOUNTER — Other Ambulatory Visit: Payer: Self-pay | Admitting: *Deleted

## 2016-03-18 DIAGNOSIS — C50211 Malignant neoplasm of upper-inner quadrant of right female breast: Secondary | ICD-10-CM

## 2016-03-22 ENCOUNTER — Telehealth: Payer: Self-pay | Admitting: *Deleted

## 2016-03-22 NOTE — Telephone Encounter (Signed)
T/C made to Catheryn Bacon at scheduled time for PALLAS study follow up. No answer to call and message left for patient to return call at her earliest convenience. Will also schedule her Cycle 3 follow up appointment in 2 weeks with Dr. Oliva Bustard. Unable to complete this at her last appointment due to patient not yet having her work schedule available. [name], BSN, MHA, OCN 03/22/2016 2:17 PM   No return call from patient in >24 hours. Second call placed today with no answer and message left on voicemail for patient to return call at her convenience for follow up and to schedule next office visit with Dr. Oliva Bustard. [name], BSN, MHA, OCN 03/23/2016 4:01 PM

## 2016-03-23 ENCOUNTER — Encounter: Payer: Self-pay | Admitting: *Deleted

## 2016-03-23 ENCOUNTER — Telehealth: Payer: Self-pay | Admitting: *Deleted

## 2016-03-23 NOTE — Telephone Encounter (Addendum)
Received t/c from Paula [name], returning my call for the purpose of phone follow up for the AFT-05 PALLAS study. Paula [name] states she is doing OK, but continues to have some pain in her legs after working all day, as well as cramping in her left calf. States the cramps are much less intense since she began to take the vitamin B, and she hopes this will continue to improve. Paula [name] also verifies that she is still taking her Tamoxifen daily as prescribed. States she has requested the day off on 04/06/16 in order to come in and see Dr. Oliva Bustard for her last monthly study visit. Will get the appointment scheduled and call Paula [name] back with the visit time. In the meantime, Paula [name] instructed to call me if her pain or cramps worsen or do not continue to improve and she states she will. [name], BSN, MHA, OCN 03/23/2016 4:22 PM  Appointment was scheduled for 04/06/16 at 11:15 for labs and see MD at 11:30. T/C made to Paula [name] and message left on her voicemail regarding date and time of follow up appointment. Will also have the schedulers mail an appointment to the Paula [name]. [name], BSN, MHA, OCN 03/24/2016 12:25 PM

## 2016-04-05 ENCOUNTER — Other Ambulatory Visit: Payer: Self-pay | Admitting: *Deleted

## 2016-04-05 DIAGNOSIS — C50211 Malignant neoplasm of upper-inner quadrant of right female breast: Secondary | ICD-10-CM

## 2016-04-06 ENCOUNTER — Encounter: Payer: Self-pay | Admitting: Oncology

## 2016-04-06 ENCOUNTER — Inpatient Hospital Stay (HOSPITAL_BASED_OUTPATIENT_CLINIC_OR_DEPARTMENT_OTHER): Payer: Managed Care, Other (non HMO) | Admitting: Oncology

## 2016-04-06 ENCOUNTER — Inpatient Hospital Stay: Payer: Managed Care, Other (non HMO) | Attending: Oncology

## 2016-04-06 VITALS — BP 124/82 | HR 108 | Temp 97.3°F | Resp 108 | Wt 231.5 lb

## 2016-04-06 DIAGNOSIS — A630 Anogenital (venereal) warts: Secondary | ICD-10-CM | POA: Diagnosis not present

## 2016-04-06 DIAGNOSIS — N951 Menopausal and female climacteric states: Secondary | ICD-10-CM

## 2016-04-06 DIAGNOSIS — M2550 Pain in unspecified joint: Secondary | ICD-10-CM

## 2016-04-06 DIAGNOSIS — Z90722 Acquired absence of ovaries, bilateral: Secondary | ICD-10-CM

## 2016-04-06 DIAGNOSIS — Z923 Personal history of irradiation: Secondary | ICD-10-CM | POA: Diagnosis not present

## 2016-04-06 DIAGNOSIS — C50211 Malignant neoplasm of upper-inner quadrant of right female breast: Secondary | ICD-10-CM

## 2016-04-06 DIAGNOSIS — Z90710 Acquired absence of both cervix and uterus: Secondary | ICD-10-CM | POA: Insufficient documentation

## 2016-04-06 DIAGNOSIS — Z170 Estrogen receptor positive status [ER+]: Secondary | ICD-10-CM

## 2016-04-06 DIAGNOSIS — Z79810 Long term (current) use of selective estrogen receptor modulators (SERMs): Secondary | ICD-10-CM | POA: Insufficient documentation

## 2016-04-06 DIAGNOSIS — Z79899 Other long term (current) drug therapy: Secondary | ICD-10-CM

## 2016-04-06 DIAGNOSIS — Z006 Encounter for examination for normal comparison and control in clinical research program: Secondary | ICD-10-CM | POA: Insufficient documentation

## 2016-04-06 DIAGNOSIS — F17210 Nicotine dependence, cigarettes, uncomplicated: Secondary | ICD-10-CM | POA: Insufficient documentation

## 2016-04-06 LAB — CBC WITH DIFFERENTIAL/PLATELET
BASOS ABS: 0 10*3/uL (ref 0–0.1)
BASOS PCT: 0 %
EOS ABS: 0.2 10*3/uL (ref 0–0.7)
Eosinophils Relative: 3 %
HCT: 37.6 % (ref 35.0–47.0)
HEMOGLOBIN: 12.7 g/dL (ref 12.0–16.0)
Lymphocytes Relative: 24 %
Lymphs Abs: 1.5 10*3/uL (ref 1.0–3.6)
MCH: 28.8 pg (ref 26.0–34.0)
MCHC: 33.7 g/dL (ref 32.0–36.0)
MCV: 85.5 fL (ref 80.0–100.0)
MONOS PCT: 7 %
Monocytes Absolute: 0.4 10*3/uL (ref 0.2–0.9)
NEUTROS PCT: 66 %
Neutro Abs: 4.1 10*3/uL (ref 1.4–6.5)
Platelets: 249 10*3/uL (ref 150–440)
RBC: 4.4 MIL/uL (ref 3.80–5.20)
RDW: 17.1 % — AB (ref 11.5–14.5)
WBC: 6.2 10*3/uL (ref 3.6–11.0)

## 2016-04-06 LAB — COMPREHENSIVE METABOLIC PANEL
ALBUMIN: 3.8 g/dL (ref 3.5–5.0)
ALT: 21 U/L (ref 14–54)
ANION GAP: 9 (ref 5–15)
AST: 23 U/L (ref 15–41)
Alkaline Phosphatase: 64 U/L (ref 38–126)
BUN: 10 mg/dL (ref 6–20)
CO2: 25 mmol/L (ref 22–32)
Calcium: 9.1 mg/dL (ref 8.9–10.3)
Chloride: 105 mmol/L (ref 101–111)
Creatinine, Ser: 0.62 mg/dL (ref 0.44–1.00)
GFR calc Af Amer: >60 mL/min (ref >60)
GFR calc non Af Amer: >60 mL/min (ref >60)
GLUCOSE: 101 mg/dL — AB (ref 65–99)
POTASSIUM: 4 mmol/L (ref 3.5–5.1)
SODIUM: 139 mmol/L (ref 135–145)
Total Bilirubin: 0.5 mg/dL (ref 0.3–1.2)
Total Protein: 7.4 g/dL (ref 6.5–8.1)

## 2016-04-06 NOTE — Progress Notes (Signed)
Patient continues to have night sweats.  Also c/o joint pain.

## 2016-04-06 NOTE — Progress Notes (Signed)
Paula [name] returns to clinic this morning for her Cycle 3 Day 1 visit for the AFT-05 PALLAS study. She has returned her med calendar for daily Tamoxifen, and has completed it. Exchanged this for a new calendar. She also completed the PRO and Med adherence questionnaires while in clinic. Patient states she continues to experience hot flashes but no worse than last visit. She also reports tingling sensation in last two fingers of left hand but does not prevent her from performing any ADL's. She states that she also has some muscle aches but is now taking Vit. D which helps. Her weight today is 231lbs, or approx. 5.5% of baseline weight.  VS are stable.  Dr. Oliva Bustard in to examine patient and reviewed labs. Dr. Oliva Bustard discussed plan to quit smoking, patient not ready to quit at this time. Patient to return in 3 months and at that time she will see Dr. Rogue Bussing who may change patient over to Letrozole at some point in future. Next appointment scheduled for June 30, 2016 for C6D1 visit. AE's and attributions below:  Mirian Mo, RN, BSN 04/06/2016 12:50 PM     Adverse Event Log  Study/Protocol: AFT-05 PALLAS Cycle: 3; Day 1  Event Grade Onset Date Resolved Date Drug Name Attribution Treatment Comments  Joint pain Grade 1 09/2015  Tamoxifen Probably    Hot flashes Grade 1 09/2015  Tamoxifen Probably    Night sweats Grade 1 09/2015  Tamoxifen Probably    Muscle cramps Grade 1 02/21/16  Tamoxifen Possible Rx for high dose Vitamin D Pt. Reports improvement with Vit. D  Weight gain Grade 1 02/04/16  Tamoxifen Possible    Tingling sensation in fingers (left hand, 4th and 5th fingers) Grade 1 03/2016  Tamoxifen Possible          Mirian Mo, RN, BSN 04/06/2016 12:50 PM

## 2016-04-12 ENCOUNTER — Encounter: Payer: Self-pay | Admitting: Oncology

## 2016-04-12 NOTE — Progress Notes (Signed)
[place] @ [facility] [phone]  Fax:([phone]) [place]: Sep 06, 1966  MR#: [id number]  ASN#:[id number]  Patient Care Team: Gearldine Shown, DO as PCP - General (Family Medicine) Robert Bellow, MD (General Surgery) Gearldine Shown, DO as Referring Physician (Family Medicine)  CHIEF COMPLAINT:   70.  50 year old lady with a history of carcinoma of breast status post lumpectomy (right breast) T1 cN1 M0 estrogen and progesterone receptor positive HER-2/neu receptor negative stage II disease diagnosis in July of 2016 It is post lumpectomy and sentinel lymph node evaluation to positive lymph node macro metastases with disease.  2.  BRCA: No significant mutation observed.  (August of 2016). 3.  Multiple gene analysis by mammoprint shows low risk (August, 2016)\ 4.  Patient started radiation therapy.  (August, 2016) 5.Patient has finished radiation therapy in October of 2016  6.Patient is starting tamoxifen 20 mg by mouth daily. Patient had bilateral oophorectomy and hysterectomy Recently patient has signed up for   Our  [facility] D.  (February, 2017)  7.  Some had a bilateral oophorectomy and hysterectomy on December of 2016 VISIT DIAGNOSIS:  Carcinoma of right breast  No history exists.    Oncology Flowsheet 07/17/2015 12/01/2015 12/03/2015  dexamethasone (DECADRON) IV - - -  ondansetron (ZOFRAN) IV - - -  tamoxifen (NOLVADEX) PO - - 20 mg    INTERVAL HISTORY:  patient is a 50 year old  Moderately obese  female who presented with an abnormal mammogram of her left breast showing a 1.9 cm irregular spiculated mass located in the upper inner quadrant of the right breast at the 2:30 position. She also had another complex cyst measuring 2.3 cm within the right breast at the 10:00 position both were biopsiedand lesion at the 2:30 position was positive for invasive mammary carcinoma. Lesion at 10:00 position showed no evidence  of malignancy. She then underwent a wide local excision and sentinel node biopsy.tumor measured 2.0 cm margins were close but negative for malignancy at 1 mm.  Patient is here for ongoing evaluation and treatment consideration.  Underwent bilateral oophorectomy and hysterectomy. Patient has a hot flashes.  Night sweats also complains of joint pain.  Patient is taking tamoxifen and tolerating well Pathology report has been reviewed and essentially negative for any malignancy Patient is here for follow-up on our investigational study.                     GENERAL:  Feels good.  Active.  No fevers, sweats or weight loss. Night sweats and joint pains PERFORMANCE STATUS (ECOG):  0 HEENT:  No visual changes, runny nose, sore throat, mouth sores or tenderness. Lungs: No shortness of breath or cough.  No hemoptysis. Cardiac:  No chest pain, palpitations, orthopnea, or PND. GI:  No nausea, vomiting, diarrhea, constipation, melena or hematochezia. GU:  No urgency, frequency, dysuria, or hematuria. Hot flashes Musculoskeletal:  No back pain.  No joint pain.  No muscle tenderness. Extremities:  No pain or swelling. Skin:  No rashes or skin changes. Neuro:  No headache, numbness or weakness, balance or coordination issues. Endocrine:  No diabetes, thyroid issues, hot flashes or night sweats. Psych:  No mood changes, depression or anxiety. Pain:  No focal pain. Review of systems:  All other systems reviewed and found to be negative.  PAST MEDICAL HISTORY: Past Medical History  Diagnosis Date  . STD (female)   . HPV in female 2016  . Dog bite  of left lower leg 03/07/15  . Pneumonia 02/2015  . BRCA negative 2016  . Breast cancer ([place]) 05/28/15    pT1c,N1a:2/3 SLN positive for macrometastisi,ER positive, PR positive, HER-2/neu not overexpressed    PAST SURGICAL HISTORY: Past Surgical History  Procedure Laterality Date  . Colposcopy  05/28/15    High grade squamous intraepithelial lesion   . Breast surgery Right 05/28/15  . Wisdom tooth extraction    . Breast lumpectomy with sentinel lymph node biopsy Right 07/17/2015    Procedure: RIGHT BREAST WIDE EXCISION WITH SENTINEL NODE BIOPSY;  Surgeon: Robert Bellow, MD;  Location: [facility] ORS;  Service: General;  Laterality: Right;  . Laparoscopic vaginal hysterectomy with salpingo oophorectomy Bilateral 12/01/2015    Procedure: LAPAROSCOPIC ASSISTED VAGINAL HYSTERECTOMY WITH SALPINGO OOPHORECTOMY-converted to;  Surgeon: Benjaman Kindler, MD;  Location: [facility] ORS;  Service: Gynecology;  Laterality: Bilateral;  . Abdominal hysterectomy  12/01/2015    Procedure: total abdominal hysterectomty, bilateral salpingo- oopharectomy, cystoscopy ;  Surgeon: Benjaman Kindler, MD;  Location: [facility] ORS;  Service: Gynecology;;    FAMILY HISTORY Family History  Problem Relation Age of Onset  . Cancer Maternal Aunt 50    breast        ADVANCED DIRECTIVES:  Patient does not have any living will or healthcare power of attorney.  Information was given .  Available resources had been discussed.  We will follow-up on subsequent appointments regarding this issue  HEALTH MAINTENANCE: Social History  Substance Use Topics  . Smoking status: Current Every Day Smoker -- 1.00 packs/day for 36 years    Types: Cigarettes  . Smokeless tobacco: Never Used  . Alcohol Use: 0.0 oz/week    0 Standard drinks or equivalent per week     Comment: occ       No Known Allergies  Current Outpatient Prescriptions  Medication Sig Dispense Refill  . albuterol (PROVENTIL HFA) 108 (90 Base) MCG/ACT inhaler Inhale into the lungs.    Marland Kitchen ibuprofen (ADVIL,MOTRIN) 600 MG tablet Take 1 tablet (600 mg total) by mouth every 6 (six) hours as needed (mild pain). 30 tablet 0  . Multiple Vitamins-Minerals (MULTIVITAMIN ADULT) CHEW Chew 2 Doses by mouth daily.    . naproxen sodium (ANAPROX) 220 MG tablet Take 220 mg by mouth 2 (two) times daily with a meal.    .  oxyCODONE-acetaminophen (PERCOCET/ROXICET) 5-325 MG tablet Take 1-2 tablets by mouth every 4 (four) hours as needed (moderate to severe pain (when tolerating fluids)). 30 tablet 0  . tamoxifen (NOLVADEX) 20 MG tablet Take 1 tablet (20 mg total) by mouth daily. 30 tablet 6  . Vitamin D, Ergocalciferol, (DRISDOL) 50000 units CAPS capsule Take 1 capsule (50,000 Units total) by mouth every 7 (seven) days. 24 capsule 1   No current facility-administered medications for this visit.    OBJECTIVE: PHYSICAL EXAM: Gen. status: Patient is moderately  Obese with a BMI of 39.  Slightly apprehensive but not any acute distress  Head exam was generally normal. There was no scleral icterus or corneal arcus. Mucous membranes were moist. Examination of the skin revealed no evidence of significant rashes, suspicious appearing nevi or other concerning lesions. Lymphatic system: Supraclavicular, cervical, axillary, inguinal lymph nodes are not palpable  Cardiac exam revealed the PMI to be normally situated and sized. The rhythm was regular and no extrasystoles were noted during several minutes of auscultation. The first and second heart sounds were normal and physiologic splitting of the second heart sound was noted. There were no  murmurs, rubs, clicks, or gallops Examination of the chest was unremarkable. There were no bony deformities, no asymmetry, and no other abnormalities. Examination of right breast shows radiation changes lab breast free of masses Abdominal exam revealed normal bowel sounds. The abdomen was soft, non-tender, and without masses, organomegaly, or appreciable enlargement of the abdominal aorta. Neurologically, the patient was awake, alert, and oriented to person, place and time. There were no obvious focal neurologic abnormalities. Musculoskeletal system without any significant problem Your extremity trace edema. Examination of both breasts: Right breast there are radiation changes.  No  palpable lymph node.  Left breast there is on outer side lower and outer quadrant and there is some thickening.  Being followed by Dr. Bufford Lope Vitals:   04/06/16 1150  BP: 124/82  Pulse: 108  Temp: 97.3 F (36.3 C)  Resp: 108     Body mass index is 41.02 kg/(m^2).    ECOG FS:0 - Asymptomatic  LAB RESULts June 03, 2015                  Pathology has been reviewed.   STUDIES: No results found.  ASSESSMENT:  1  Carcinoma of right breast T1 cN1 M0 tumor stage II estrogen receptor positive progesterone receptor positive HER-2/neu receptor negative. Status post lumpectomy and sentinel lymph node evaluation   PLAN:   Patient is taking tamoxifen.  He is having hot flashes and night sweats which are postmenopausal either due to tamoxifen or secondary to postmenopausal status.  Also complains of joint pains. No other evidence of recurrent disease . follow-up as per protocol      No matching staging information was found for the patient.  Forest Gleason, MD   04/12/2016 8:12 AM

## 2016-04-19 ENCOUNTER — Encounter: Payer: Self-pay | Admitting: Radiation Oncology

## 2016-04-19 ENCOUNTER — Ambulatory Visit
Admission: RE | Admit: 2016-04-19 | Discharge: 2016-04-19 | Disposition: A | Discharge status: Home / Self Care | Payer: Managed Care, Other (non HMO) | Source: Ambulatory Visit | Attending: Radiation Oncology | Admitting: Radiation Oncology

## 2016-04-19 VITALS — BP 128/85 | HR 76 | Temp 98.0°F | Resp 20 | Wt 233.5 lb

## 2016-04-19 DIAGNOSIS — C50911 Malignant neoplasm of unspecified site of right female breast: Secondary | ICD-10-CM

## 2016-04-19 NOTE — Progress Notes (Signed)
Radiation Oncology Follow up Note  Name: Paula [name]   Date:   04/19/2016 MRN:  [id number] DOB: 1966-10-12    This 50 y.o. female presents to the clinic today for stage II breast cancer (T1 CN I M0) ER/PR positive HER-2/neu negative invasive mammary carcinoma of her left breast status post wide local excision and adjuvant whole breast radiation.  REFERRING PROVIDER: Robert Bellow, MD  HPI: Patient is a 49 year old female now out 6 months having completed whole breast radiation to her left breast for stage II invasive mammary carcinoma status post wide local excision and adjuvant radiation therapy. Seen today in routine follow-up she is doing well. She specifically denies breast tenderness cough or bone pain.. She has completed bilateral TAH/BSO. She's currently on tamoxifen tolerating that well without side effect. She has a mammogram is scheduled in June  COMPLICATIONS OF TREATMENT: none  FOLLOW UP COMPLIANCE: keeps appointments   PHYSICAL EXAM:  BP 128/85 mmHg  Pulse 76  Temp(Src) 98 F (36.7 C)  Resp 20  Wt 233 lb 7.5 oz (105.9 kg) Lungs are clear to A&P cardiac examination essentially unremarkable with regular rate and rhythm. No dominant mass or nodularity is noted in either breast in 2 positions examined. Incision is well-healed. No axillary or supraclavicular adenopathy is appreciated. Cosmetic result is excellent. Well-developed well-nourished patient in NAD. HEENT reveals PERLA, EOMI, discs not visualized.  Oral cavity is clear. No oral mucosal lesions are identified. Neck is clear without evidence of cervical or supraclavicular adenopathy. Lungs are clear to A&P. Cardiac examination is essentially unremarkable with regular rate and rhythm without murmur rub or thrill. Abdomen is benign with no organomegaly or masses noted. Motor sensory and DTR levels are equal and symmetric in the upper and lower extremities. Cranial nerves II through XII are grossly intact. Proprioception  is intact. No peripheral adenopathy or edema is identified. No motor or sensory levels are noted. Crude visual fields are within normal range.  RADIOLOGY RESULTS: Mammograms June will be reviewed  PLAN: Present time she continues to do well with no evidence of disease. I have asked to see her back in 6 months for follow-up. She continues on tamoxifen without side effect. Patient knows to call sooner with any concerns. She continues close follow-up care with medical oncology.  I would like to take this opportunity for allowing me to participate in the care of your patient.Paula [name]., MD

## 2016-04-23 IMAGING — CT CT CHEST W/ CM
1 series · 15 of 31 positions shown, 19 images · IV contrast (omnipaque)
Comparison: Report from 09/16/2015

CLINICAL DATA: Breast cancer, prior lumpectomy. Positive lymph node
involvement. Radiation therapy is been started. Pulmonary nodule in
the left lower lobe on chest radiography.

EXAM:
CT CHEST WITH CONTRAST
TECHNIQUE: Multidetector CT imaging of the chest was performed during
intravenous contrast administration.
CONTRAST:  75mL OMNIPAQUE IOHEXOL 300 MG/ML  SOLN

[Series 2: routine chest with · axial · 0.58mm/px · z∈[-552,-287]mm · 15 of 59 slices shown, 19 images]
[im 3/59  mediastinal]
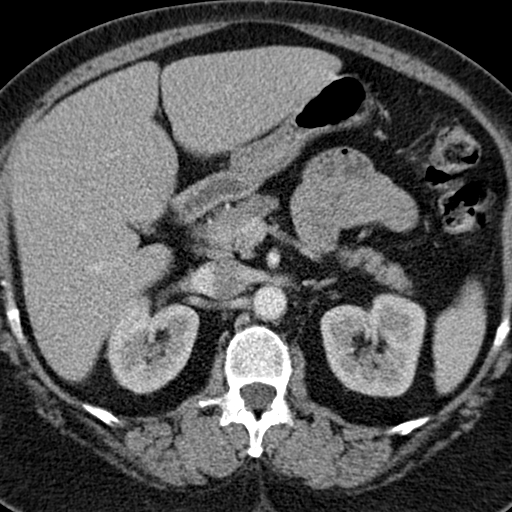
[im 3/59  lung]
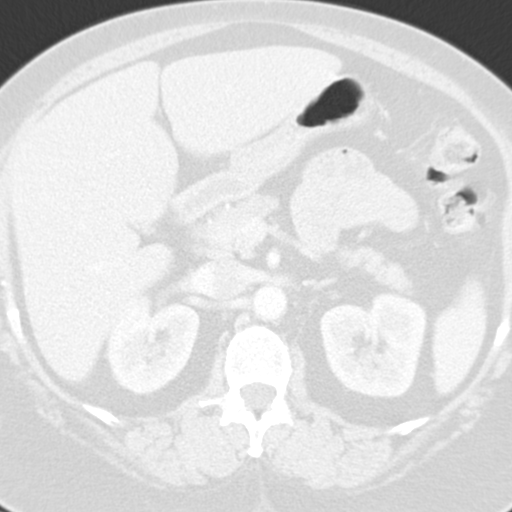
[im 7/59  lung]
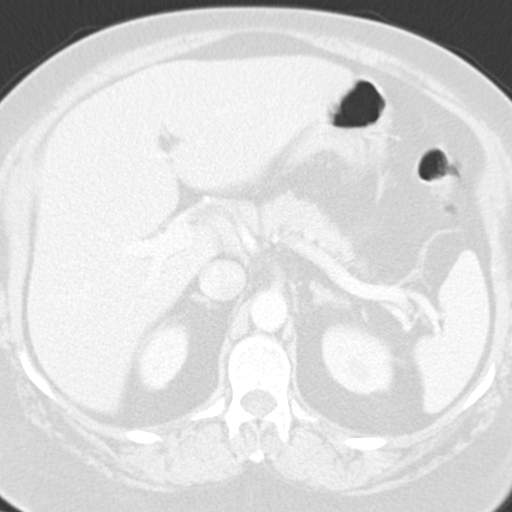
[im 11/59  lung]
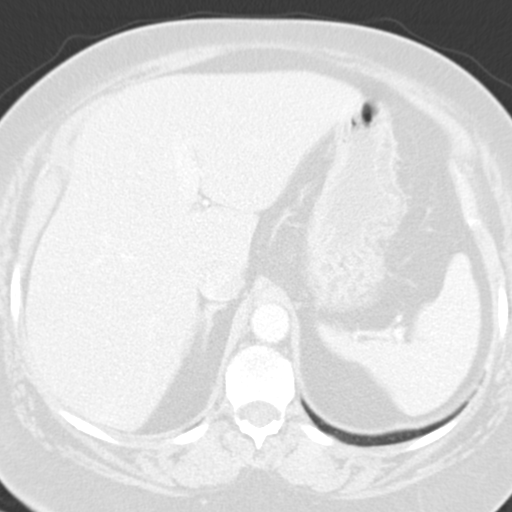
[im 13/59  lung]
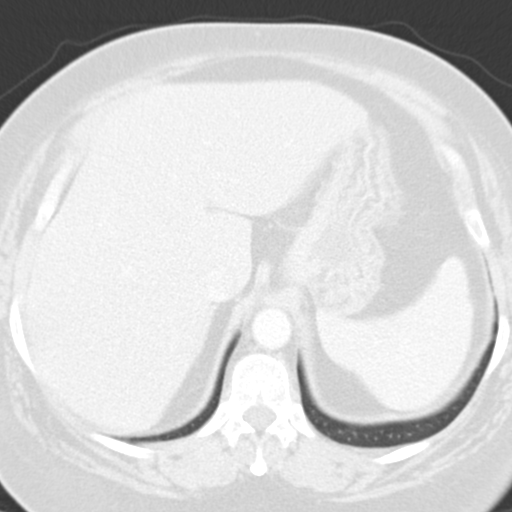
[im 18/59  mediastinal]
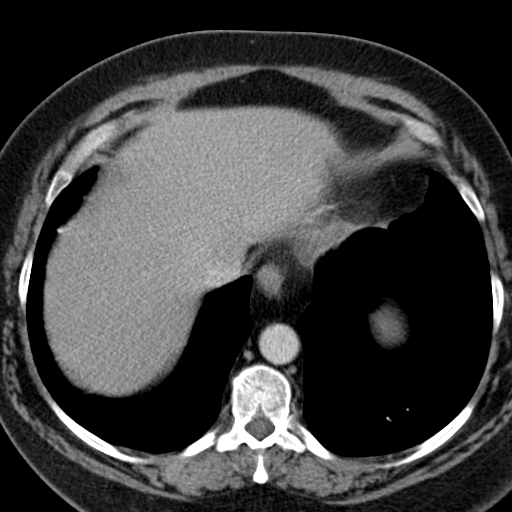
[im 18/59  lung]
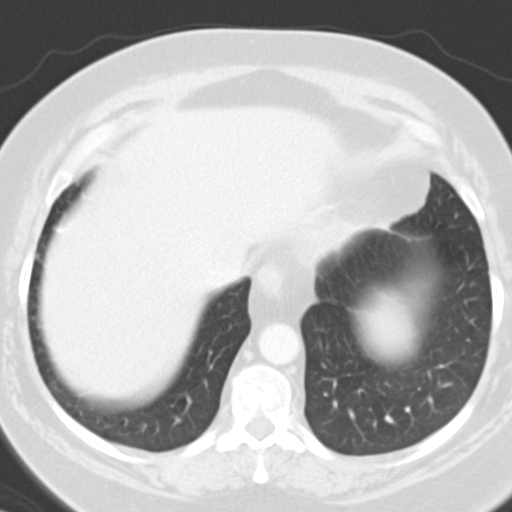
[im 22/59  lung]
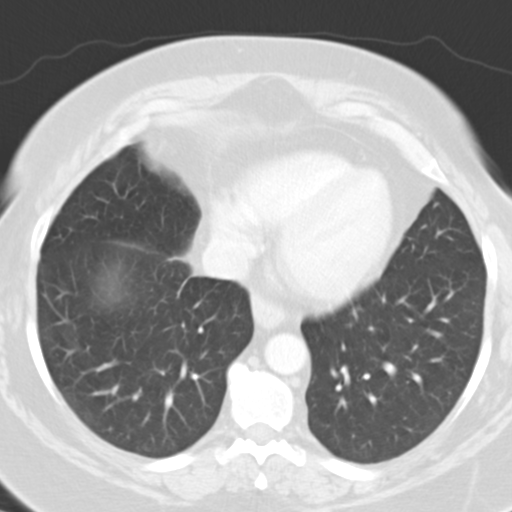
[im 26/59  lung]
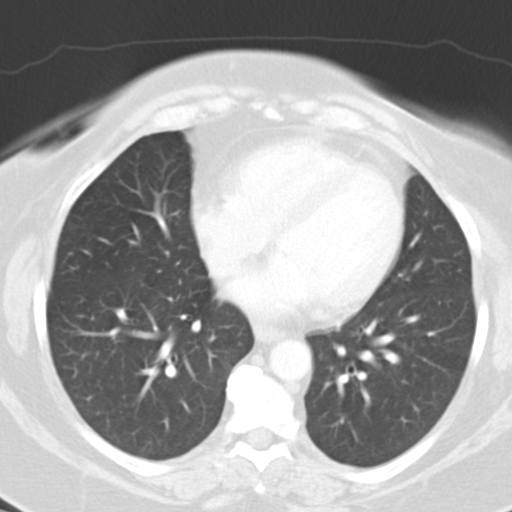
[im 31/59  lung]
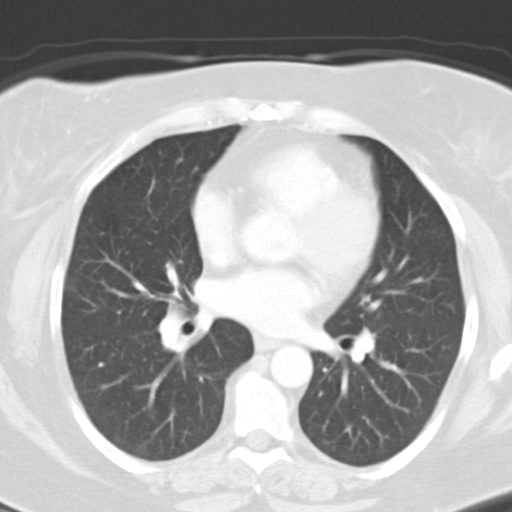
[im 33/59  mediastinal]
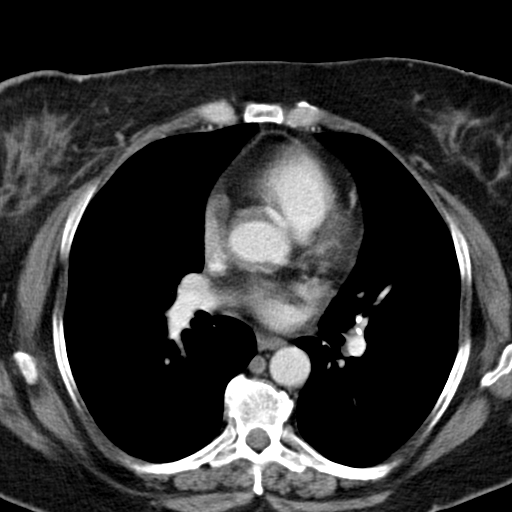
[im 33/59  lung]
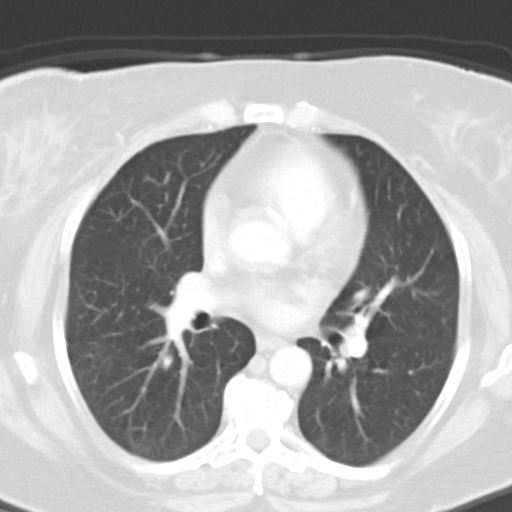
[im 37/59  lung]
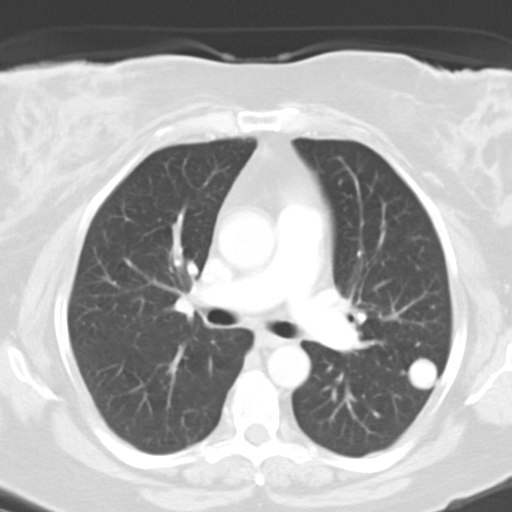
[im 41/59  lung]
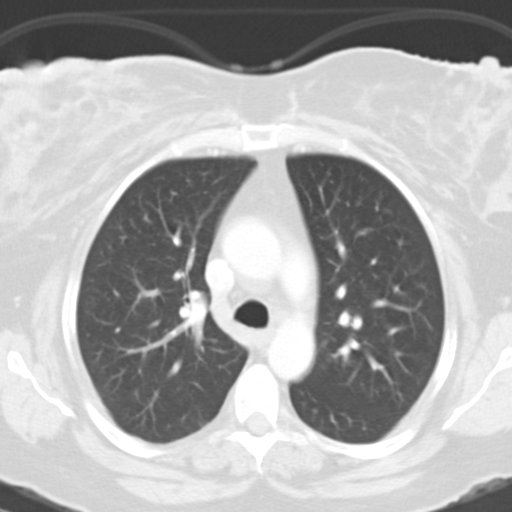
[im 46/59  lung]
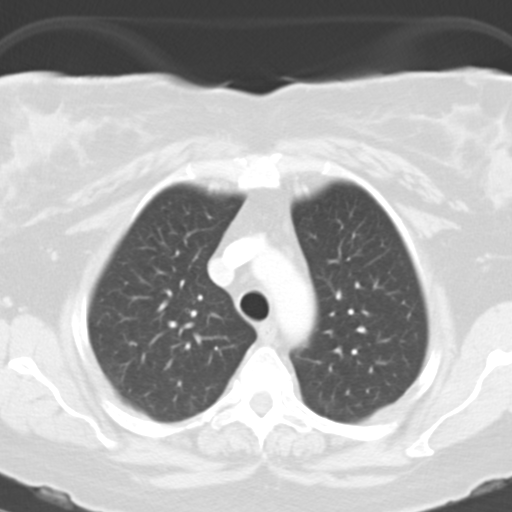
[im 48/59  mediastinal]
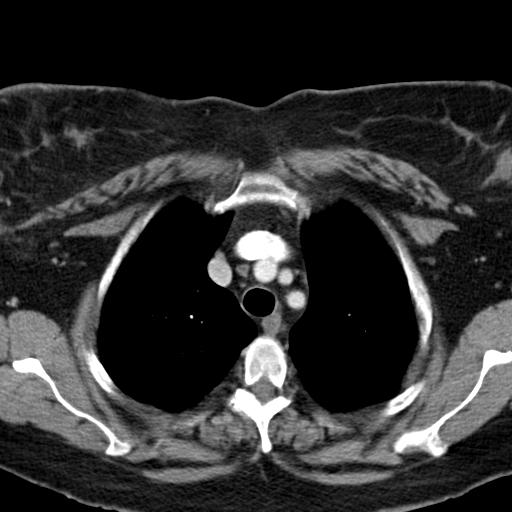
[im 48/59  lung]
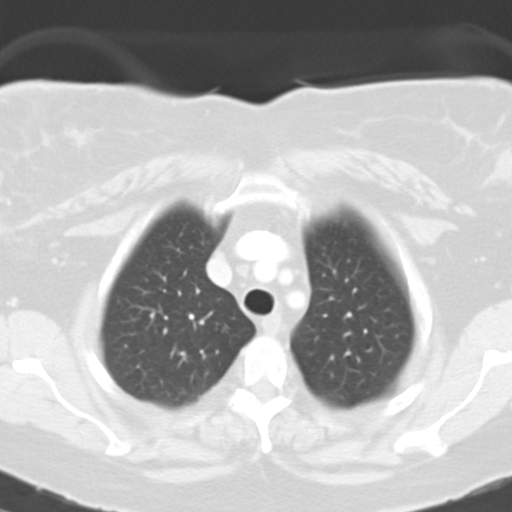
[im 52/59  lung]
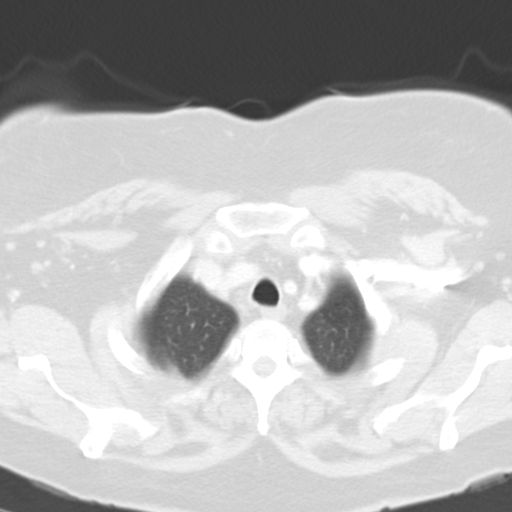
[im 56/59  lung]
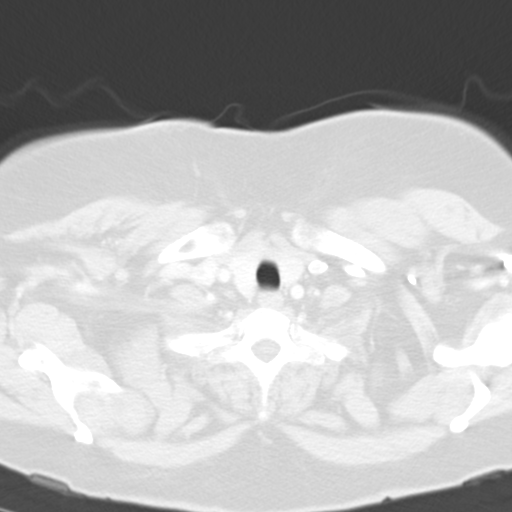

[15 of 31 positions shown; findings below may reference images not displayed]

FINDINGS: Mediastinum/Nodes: Unremarkable

Lungs/Pleura: Posteriorly in the left upper lobe and partially along
the major fissure, a 1.8 by 1.8 by 1.6 cm pulmonary nodule is
primarily densely calcified but has a thin rim of marginal soft
tissue. This is shown on image 124 series 6. There is an adjacent
2-3 mm nodular density along the major fissure on image 127 of
series 6.

Upper abdomen: We partially image the gallbladder showing
cholelithiasis with a 1.4 cm gallstone on image 59 series 2 and at
least 1 adjacent gallstone.

Musculoskeletal: Unremarkable
IMPRESSION: 1. The nodule of concern is in the left upper lobe and measures
cm in diameter. For the most part this nodule was very densely
calcified, with only a thin rim of soft tissue density. Differential
diagnostic considerations include old granulomatous disease,
carcinoid tumor, calcifying fibrous pseudotumor, pulmonary hamartoma
(which are common, but less likely to give the smooth type of
calcifications seen in this lesion), or less likely primary lung
cancer or metastatic lesion. Of these and based on the solitary
nature and generally dense/diffuse calcification, old granulomatous
disease is favored with fibrous pseudotumor and carcinoid tumor
being in the next tier of possibility, and the remaining lesions
less likely. Nuclear medicine PET-CT could be utilized for further
characterization if clinically warranted. Also the patient has any
remote chest radiographs from outside institutions these could be
used to further assess.

## 2016-05-10 ENCOUNTER — Encounter: Payer: Self-pay | Admitting: *Deleted

## 2016-05-10 ENCOUNTER — Other Ambulatory Visit: Payer: Self-pay | Admitting: Oncology

## 2016-05-13 ENCOUNTER — Telehealth: Payer: Self-pay | Admitting: *Deleted

## 2016-05-13 NOTE — Telephone Encounter (Signed)
T/C made to patient and second message left with Paula [name] to call me back in reference to a new clinical trial she is eligible for. Paula [name] is potentially eligible for the [employer] BWEL study and t/c was made to gauge Paula [name] interest. No answer to call and message was left informing patient of basic study purpose and asking Paula [name] to return my call to let me know whether she is interested in learning more about the study. Paula [name], BSN, MHA, OCN 05/13/2016 03:57 PM

## 2016-05-18 NOTE — Telephone Encounter (Signed)
Attempted to contact patient again by phone this afternoon. No response. No message left this call. [name], BSN, MHA, OCN 05/18/2016 2:19 PM

## 2016-05-21 ENCOUNTER — Other Ambulatory Visit: Payer: Self-pay | Admitting: General Surgery

## 2016-05-21 ENCOUNTER — Ambulatory Visit
Admission: RE | Admit: 2016-05-21 | Discharge: 2016-05-21 | Disposition: A | Discharge status: Home / Self Care | Payer: Managed Care, Other (non HMO) | Source: Ambulatory Visit | Attending: General Surgery | Admitting: General Surgery

## 2016-05-21 DIAGNOSIS — C50211 Malignant neoplasm of upper-inner quadrant of right female breast: Secondary | ICD-10-CM

## 2016-05-21 DIAGNOSIS — Z853 Personal history of malignant neoplasm of breast: Secondary | ICD-10-CM | POA: Insufficient documentation

## 2016-05-21 DIAGNOSIS — N6002 Solitary cyst of left breast: Secondary | ICD-10-CM | POA: Insufficient documentation

## 2016-05-26 ENCOUNTER — Ambulatory Visit: Payer: Managed Care, Other (non HMO) | Admitting: General Surgery

## 2016-05-27 ENCOUNTER — Ambulatory Visit: Payer: Managed Care, Other (non HMO)

## 2016-06-30 ENCOUNTER — Inpatient Hospital Stay: Payer: Managed Care, Other (non HMO)

## 2016-06-30 ENCOUNTER — Inpatient Hospital Stay: Payer: Managed Care, Other (non HMO) | Attending: Internal Medicine | Admitting: Internal Medicine

## 2016-06-30 ENCOUNTER — Encounter: Payer: Self-pay | Admitting: *Deleted

## 2016-06-30 VITALS — BP 128/82 | HR 74 | Temp 98.3°F | Resp 18 | Ht 64.0 in | Wt 235.9 lb

## 2016-06-30 DIAGNOSIS — Z170 Estrogen receptor positive status [ER+]: Secondary | ICD-10-CM

## 2016-06-30 DIAGNOSIS — C50211 Malignant neoplasm of upper-inner quadrant of right female breast: Secondary | ICD-10-CM | POA: Diagnosis not present

## 2016-06-30 DIAGNOSIS — N951 Menopausal and female climacteric states: Secondary | ICD-10-CM

## 2016-06-30 DIAGNOSIS — Z79899 Other long term (current) drug therapy: Secondary | ICD-10-CM | POA: Diagnosis not present

## 2016-06-30 DIAGNOSIS — Z803 Family history of malignant neoplasm of breast: Secondary | ICD-10-CM | POA: Diagnosis not present

## 2016-06-30 DIAGNOSIS — A630 Anogenital (venereal) warts: Secondary | ICD-10-CM

## 2016-06-30 DIAGNOSIS — Z006 Encounter for examination for normal comparison and control in clinical research program: Secondary | ICD-10-CM

## 2016-06-30 DIAGNOSIS — G629 Polyneuropathy, unspecified: Secondary | ICD-10-CM | POA: Insufficient documentation

## 2016-06-30 DIAGNOSIS — F17210 Nicotine dependence, cigarettes, uncomplicated: Secondary | ICD-10-CM | POA: Diagnosis not present

## 2016-06-30 DIAGNOSIS — Z90710 Acquired absence of both cervix and uterus: Secondary | ICD-10-CM

## 2016-06-30 DIAGNOSIS — Z6841 Body Mass Index (BMI) 40.0 and over, adult: Secondary | ICD-10-CM | POA: Diagnosis not present

## 2016-06-30 DIAGNOSIS — Z79810 Long term (current) use of selective estrogen receptor modulators (SERMs): Secondary | ICD-10-CM | POA: Diagnosis not present

## 2016-06-30 DIAGNOSIS — Z90722 Acquired absence of ovaries, bilateral: Secondary | ICD-10-CM

## 2016-06-30 DIAGNOSIS — R252 Cramp and spasm: Secondary | ICD-10-CM

## 2016-06-30 LAB — CBC WITH DIFFERENTIAL/PLATELET
BASOS ABS: 0.1 10*3/uL (ref 0–0.1)
Basophils Relative: 1 %
Eosinophils Absolute: 0.1 10*3/uL (ref 0–0.7)
Eosinophils Relative: 2 %
HEMATOCRIT: 39.2 % (ref 35.0–47.0)
Hemoglobin: 13.1 g/dL (ref 12.0–16.0)
LYMPHS ABS: 1.4 10*3/uL (ref 1.0–3.6)
LYMPHS PCT: 23 %
MCH: 30 pg (ref 26.0–34.0)
MCHC: 33.5 g/dL (ref 32.0–36.0)
MCV: 89.5 fL (ref 80.0–100.0)
MONO ABS: 0.4 10*3/uL (ref 0.2–0.9)
Monocytes Relative: 6 %
NEUTROS ABS: 4.1 10*3/uL (ref 1.4–6.5)
Neutrophils Relative %: 68 %
Platelets: 233 10*3/uL (ref 150–440)
RBC: 4.38 MIL/uL (ref 3.80–5.20)
RDW: 16.5 % — ABNORMAL HIGH (ref 11.5–14.5)
WBC: 6.1 10*3/uL (ref 3.6–11.0)

## 2016-06-30 LAB — HEMOGLOBIN A1C: HEMOGLOBIN A1C: 6 % (ref 4.0–6.0)

## 2016-06-30 LAB — COMPREHENSIVE METABOLIC PANEL
ALT: 20 U/L (ref 14–54)
AST: 20 U/L (ref 15–41)
Albumin: 3.7 g/dL (ref 3.5–5.0)
Alkaline Phosphatase: 68 U/L (ref 38–126)
Anion gap: 5 (ref 5–15)
BILIRUBIN TOTAL: 0.3 mg/dL (ref 0.3–1.2)
BUN: 10 mg/dL (ref 6–20)
CALCIUM: 8.9 mg/dL (ref 8.9–10.3)
CO2: 26 mmol/L (ref 22–32)
CREATININE: 0.53 mg/dL (ref 0.44–1.00)
Chloride: 107 mmol/L (ref 101–111)
GFR calc Af Amer: >60 mL/min (ref >60)
Glucose, Bld: 97 mg/dL (ref 65–99)
Potassium: 4.3 mmol/L (ref 3.5–5.1)
Sodium: 138 mmol/L (ref 135–145)
TOTAL PROTEIN: 7.4 g/dL (ref 6.5–8.1)

## 2016-06-30 NOTE — Assessment & Plan Note (Addendum)
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 Clinically no evidence of recurrence. Control arm of PALLAS study.   # Muscle cramps and leg cramps- likely from tamoxifen-improved; improved on vitamin D 50,000 units once a week.  # Mild tingling and numbness of the hand question carpal tunnel left therapy from tamoxifen.   # 15 minutes face-to-face with the patient discussing the above plan of care; more than 50% of time spent on prognosis/ natural history; counseling and coordination.  # Follow-up with me in 3 months.

## 2016-06-30 NOTE — Progress Notes (Signed)
[place] OFFICE PROGRESS NOTE  Patient Care Team: Gearldine Shown, DO as PCP - General (Family Medicine) Robert Bellow, MD (General Surgery) Gearldine Shown, DO as Referring Physician (Family Medicine)   SUMMARY OF ONCOLOGIC HISTORY:  Oncology History   # 2016- BREAST CANCER  STAGE II T1 cN1 M0 [s/p Lumpec & RT]ER/PR- Pos; HER-2/neu receptor negative. Mammaprint- Low risk; no chemo; Oct 2016- START TAM; [facility] [place] study/control arm.   # Dec 2016-  TAH + BSO      Breast cancer of upper-inner quadrant of right female breast [facility])       INTERVAL HISTORY:  50 year old female patient with above history of stage II breast cancer currently on tamoxifen is here for follow-up. She is also on the control arm of the [place] study. ...   Patient complains of hot flashes/ night sweats which wakes up from sleep. She also complains of body aches/leg cramps especially after work. Otherwise denies any lumps or bumps. Denies any mood swings. Denies any leg swelling.  Appetite is good. No cough shortness of breath or chest pain.    REVIEW OF SYSTEMS:  A complete 10 point review of system is done which is negative except mentioned above/history of present illness.   PAST MEDICAL HISTORY :  Past Medical History  Diagnosis Date  . STD (female)   . HPV in female 2016  . Dog bite of left lower leg 03/07/15  . Pneumonia 02/2015  . BRCA negative 2016  . Breast cancer ([place]) 05/28/15    pT1c,N1a:2/3 SLN positive for macrometastisi,ER positive, PR positive, HER-2/neu not overexpressed c Radiation    PAST SURGICAL HISTORY :   Past Surgical History  Procedure Laterality Date  . Colposcopy  05/28/15    High grade squamous intraepithelial lesion  . Breast surgery Right 05/28/15  . Wisdom tooth extraction    . Breast lumpectomy with sentinel lymph node biopsy Right 07/17/2015    Procedure: RIGHT BREAST WIDE EXCISION WITH SENTINEL NODE BIOPSY;  Surgeon: Robert Bellow, MD;  Location: [facility] ORS;  Service: General;  Laterality: Right;  . Laparoscopic vaginal hysterectomy with salpingo oophorectomy Bilateral 12/01/2015    Procedure: LAPAROSCOPIC ASSISTED VAGINAL HYSTERECTOMY WITH SALPINGO OOPHORECTOMY-converted to;  Surgeon: Benjaman Kindler, MD;  Location: [facility] ORS;  Service: Gynecology;  Laterality: Bilateral;  . Abdominal hysterectomy  12/01/2015    Procedure: total abdominal hysterectomty, bilateral salpingo- oopharectomy, cystoscopy ;  Surgeon: Benjaman Kindler, MD;  Location: [facility] ORS;  Service: Gynecology;;  . Breast biopsy Right 6.7.16    Positive    FAMILY HISTORY :   Family History  Problem Relation Age of Onset  . Cancer Maternal Aunt 50    breast  . Breast cancer Maternal Aunt     SOCIAL HISTORY:   Social History  Substance Use Topics  . Smoking status: Current Every Day Smoker -- 1.00 packs/day for 36 years    Types: Cigarettes  . Smokeless tobacco: Never Used  . Alcohol Use: 0.0 oz/week    0 Standard drinks or equivalent per week     Comment: occ    ALLERGIES:  has No Known Allergies.  MEDICATIONS:  Current Outpatient Prescriptions  Medication Sig Dispense Refill  . albuterol (PROVENTIL HFA) 108 (90 Base) MCG/ACT inhaler Inhale into the lungs.    Marland Kitchen ibuprofen (ADVIL,MOTRIN) 600 MG tablet Take 1 tablet (600 mg total) by mouth every 6 (six) hours as needed (mild pain). 30 tablet 0  . Multiple Vitamins-Minerals (MULTIVITAMIN ADULT) CHEW  Chew 2 Doses by mouth daily.    . naproxen sodium (ANAPROX) 220 MG tablet Take 220 mg by mouth 2 (two) times daily with a meal.    . oxyCODONE-acetaminophen (PERCOCET/ROXICET) 5-325 MG tablet Take 1-2 tablets by mouth every 4 (four) hours as needed (moderate to severe pain (when tolerating fluids)). 30 tablet 0  . tamoxifen (NOLVADEX) 20 MG tablet TAKE 1 TABLET (20 MG TOTAL) BY MOUTH DAILY. 30 tablet 6  . Vitamin D, Ergocalciferol, (DRISDOL) 50000 units CAPS capsule Take 1 capsule (50,000 Units  total) by mouth every 7 (seven) days. 24 capsule 1   No current facility-administered medications for this visit.    PHYSICAL EXAMINATION: ECOG PERFORMANCE STATUS: 0 - Asymptomatic  BP 128/82 mmHg  Pulse 74  Temp(Src) 98.3 F (36.8 C) (Tympanic)  Resp 18  Ht '5\' 4"'$  (1.626 m)  Wt 235 lb 14.3 oz (107 kg)  BMI 40.47 kg/m2  SpO2   LMP 09/20/2015  Filed Weights   06/30/16 1053  Weight: 235 lb 14.3 oz (107 kg)    GENERAL: Well-nourished well-developed; Alert, no distress and comfortable.   Alone. Obese.  EYES: no pallor or icterus OROPHARYNX: no thrush or ulceration; good dentition  NECK: supple, no masses felt LYMPH:  no palpable lymphadenopathy in the cervical, axillary or inguinal regions LUNGS: clear to auscultation and  No wheeze or crackles HEART/CVS: regular rate & rhythm and no murmurs; No lower extremity edema ABDOMEN:abdomen soft, non-tender and normal bowel sounds Musculoskeletal:no cyanosis of digits and no clubbing  PSYCH: alert & oriented x 3 with fluent speech NEURO: no focal motor/sensory deficits SKIN:  no rashes or significant lesions Right and left BREAST exam [in the presence of nurse]- no unusual skin changes or dominant masses felt. Surgical scars noted.   LABORATORY DATA:  I have reviewed the data as listed    Component Value Date/Time   NA 138 06/30/2016 0930   K 4.3 06/30/2016 0930   CL 107 06/30/2016 0930   CO2 26 06/30/2016 0930   GLUCOSE 97 06/30/2016 0930   BUN 10 06/30/2016 0930   CREATININE 0.53 06/30/2016 0930   CALCIUM 8.9 06/30/2016 0930   PROT 7.4 06/30/2016 0930   ALBUMIN 3.7 06/30/2016 0930   AST 20 06/30/2016 0930   ALT 20 06/30/2016 0930   ALKPHOS 68 06/30/2016 0930   BILITOT 0.3 06/30/2016 0930   GFRNONAA >60 06/30/2016 0930   GFRAA >60 06/30/2016 0930    No results found for: SPEP, UPEP  Lab Results  Component Value Date   WBC 6.1 06/30/2016   NEUTROABS 4.1 06/30/2016   HGB 13.1 06/30/2016   HCT 39.2 06/30/2016    MCV 89.5 06/30/2016   PLT 233 06/30/2016      Chemistry      Component Value Date/Time   NA 138 06/30/2016 0930   K 4.3 06/30/2016 0930   CL 107 06/30/2016 0930   CO2 26 06/30/2016 0930   BUN 10 06/30/2016 0930   CREATININE 0.53 06/30/2016 0930      Component Value Date/Time   CALCIUM 8.9 06/30/2016 0930   ALKPHOS 68 06/30/2016 0930   AST 20 06/30/2016 0930   ALT 20 06/30/2016 0930   BILITOT 0.3 06/30/2016 0930         ASSESSMENT & PLAN:   Breast cancer of upper-inner quadrant of right female breast ([place]) # Right Stage II Breast ca; ER/PR positive HER-2/neu negative currently on tamoxifen on adjuvant basis. Clinically no evidence of recurrence. Control arm of  P[facility]AS study.   # Muscle cramps and leg cramps- likely from tamoxifen-improved; improved on vitamin D 50,000 units once a week.  # Mild tingling and numbness of the hand question carpal tunnel left therapy from tamoxifen.   # 15 minutes face-to-face with the patient discussing the above plan of care; more than 50% of time spent on prognosis/ natural history; counseling and coordination.         Cammie Sickle, MD 06/30/2016 7:00 PM

## 2016-06-30 NOTE — Progress Notes (Signed)
Paula [name] returns to clinic for her Cycle 6, Day 1 visit on the AFT-05 PALLAS study. She denies any problems taking her Tamoxifen, but states she forgot to bring back her drug diaries for the study today. States she will fax them back to me, and my fax number was provided to her. New med calendars given to patient for the next 3 cycles. Patient had labs collected for CBC, MetC, HgbA1C as well as Central study labs. CBC and chemistries are all within normal limits. HgbA1C has not resulted yet. VS are stable this morning. Reports no change in medications since last visit. Paula [name] continues to experience muscle cramps and joint stiffness from the Tamoxifen, but reports this has improved some since she started taking Vitamin D. She also continues to experience some weight gain, now up to 15 lbs (7%) since her hysterectomy in December 2016.  Paula [name] reports she continues to experience some pain in her joints as well as muscle cramps. She also continues to experience daily hot flashes  and night sweats that occur every night. She is taking high doses of vitamin D, which she reports does help with the leg cramps.  Patient completed the PRO and Med adherence questionnaires for the PALLAS study while in clinic. Dr. Rogue Bussing in to assess patient and completed a breast exam. She had a mammogram on 05/21/16, which was negative for any new or recurrent disease. Patient will return to clinic on 09/22/16 for her 9 month study visit.  06/30/2016  Adverse Event Log  Study/Protocol: AFT-05 PALLAS Cycle: 3-5  Event Grade Onset Date Resolved Date Drug Name Attribution Treatment Comments  Joint pain Grade 1 09/2015  Tamoxifen Probably    Hot flashes Grade 1 09/2015  Tamoxifen Probably  Pt declined tx  Night sweats Grade 1 09/2015  Tamoxifen Probably    Muscle cramps Grade 1 02/21/16  Tamoxifen Possible Rx for high dose Vitamin D Reports improved with Vit.D  Weight gain Grade 1 02/04/16  Tamoxifen Possible     Intermittant tingling in 4th & 5th fingers on left hand Grade 1 03/2016  Tamoxifen Possible    Edema in BLE Grade 1 Reported 06/30/16  Tamoxifen Unrelated  States swelling in evenings  [name], BSN, [place], OCN 06/30/2016 10:59 AM

## 2016-07-20 ENCOUNTER — Encounter: Payer: Self-pay | Admitting: *Deleted

## 2016-09-17 ENCOUNTER — Telehealth: Payer: Self-pay | Admitting: *Deleted

## 2016-09-17 NOTE — Telephone Encounter (Signed)
Received t/c from patient Paula [name] who is participating in the AFT-05 PALLAS study. Paula [name] states she wants to withdraw from the study and plans to cancel her appointment with Dr. Rogue Bussing next week. States she cannot afford the insurance co-pays she is being billed for and that they are $140 - $170 with each visit. Encouraged patient to come into the clinic next week and talk with me, and that she will not be billed for just seeing me that day. Will make sure she does want to officially withdraw and collect her outstanding drug logs and Central study labs at that time. [name], BSN, MHA, OCN 09/17/2016 4:08 PM

## 2016-09-22 ENCOUNTER — Inpatient Hospital Stay: Payer: Managed Care, Other (non HMO) | Admitting: Internal Medicine

## 2016-09-22 ENCOUNTER — Encounter: Payer: Self-pay | Admitting: *Deleted

## 2016-09-22 ENCOUNTER — Inpatient Hospital Stay: Payer: Managed Care, Other (non HMO) | Attending: Internal Medicine

## 2016-09-22 ENCOUNTER — Inpatient Hospital Stay: Payer: Managed Care, Other (non HMO)

## 2016-09-22 DIAGNOSIS — Z791 Long term (current) use of non-steroidal anti-inflammatories (NSAID): Secondary | ICD-10-CM | POA: Insufficient documentation

## 2016-09-22 DIAGNOSIS — Z79810 Long term (current) use of selective estrogen receptor modulators (SERMs): Secondary | ICD-10-CM | POA: Diagnosis not present

## 2016-09-22 DIAGNOSIS — Z90722 Acquired absence of ovaries, bilateral: Secondary | ICD-10-CM | POA: Diagnosis not present

## 2016-09-22 DIAGNOSIS — Z923 Personal history of irradiation: Secondary | ICD-10-CM | POA: Insufficient documentation

## 2016-09-22 DIAGNOSIS — Z170 Estrogen receptor positive status [ER+]: Secondary | ICD-10-CM | POA: Diagnosis not present

## 2016-09-22 DIAGNOSIS — C50211 Malignant neoplasm of upper-inner quadrant of right female breast: Secondary | ICD-10-CM | POA: Insufficient documentation

## 2016-09-22 DIAGNOSIS — M2550 Pain in unspecified joint: Secondary | ICD-10-CM | POA: Diagnosis not present

## 2016-09-22 DIAGNOSIS — Z006 Encounter for examination for normal comparison and control in clinical research program: Secondary | ICD-10-CM | POA: Insufficient documentation

## 2016-09-22 DIAGNOSIS — Z90710 Acquired absence of both cervix and uterus: Secondary | ICD-10-CM | POA: Insufficient documentation

## 2016-09-22 DIAGNOSIS — F17210 Nicotine dependence, cigarettes, uncomplicated: Secondary | ICD-10-CM | POA: Insufficient documentation

## 2016-09-22 DIAGNOSIS — A630 Anogenital (venereal) warts: Secondary | ICD-10-CM | POA: Diagnosis not present

## 2016-09-22 LAB — COMPREHENSIVE METABOLIC PANEL
ALT: 22 U/L (ref 14–54)
ANION GAP: 5 (ref 5–15)
AST: 22 U/L (ref 15–41)
Albumin: 3.8 g/dL (ref 3.5–5.0)
Alkaline Phosphatase: 66 U/L (ref 38–126)
BUN: 9 mg/dL (ref 6–20)
CHLORIDE: 107 mmol/L (ref 101–111)
CO2: 27 mmol/L (ref 22–32)
CREATININE: 0.62 mg/dL (ref 0.44–1.00)
Calcium: 8.7 mg/dL — ABNORMAL LOW (ref 8.9–10.3)
Glucose, Bld: 112 mg/dL — ABNORMAL HIGH (ref 65–99)
POTASSIUM: 3.9 mmol/L (ref 3.5–5.1)
SODIUM: 139 mmol/L (ref 135–145)
Total Bilirubin: 0.1 mg/dL — ABNORMAL LOW (ref 0.3–1.2)
Total Protein: 6.8 g/dL (ref 6.5–8.1)

## 2016-09-22 LAB — CBC WITH DIFFERENTIAL/PLATELET
Basophils Absolute: 0.1 10*3/uL (ref 0–0.1)
Basophils Relative: 1 %
EOS ABS: 0.1 10*3/uL (ref 0–0.7)
EOS PCT: 2 %
HCT: 38.7 % (ref 35.0–47.0)
Hemoglobin: 13 g/dL (ref 12.0–16.0)
LYMPHS ABS: 1.5 10*3/uL (ref 1.0–3.6)
LYMPHS PCT: 23 %
MCH: 30.6 pg (ref 26.0–34.0)
MCHC: 33.6 g/dL (ref 32.0–36.0)
MCV: 91.2 fL (ref 80.0–100.0)
MONO ABS: 0.5 10*3/uL (ref 0.2–0.9)
Monocytes Relative: 8 %
Neutro Abs: 4.4 10*3/uL (ref 1.4–6.5)
Neutrophils Relative %: 66 %
PLATELETS: 226 10*3/uL (ref 150–440)
RBC: 4.24 MIL/uL (ref 3.80–5.20)
RDW: 15.6 % — AB (ref 11.5–14.5)
WBC: 6.6 10*3/uL (ref 3.6–11.0)

## 2016-09-29 ENCOUNTER — Encounter: Payer: Self-pay | Admitting: *Deleted

## 2016-09-29 ENCOUNTER — Encounter: Payer: Self-pay | Admitting: Internal Medicine

## 2016-09-29 ENCOUNTER — Inpatient Hospital Stay (HOSPITAL_BASED_OUTPATIENT_CLINIC_OR_DEPARTMENT_OTHER): Payer: Managed Care, Other (non HMO) | Admitting: Internal Medicine

## 2016-09-29 VITALS — BP 118/80 | HR 85 | Temp 97.1°F | Resp 16 | Ht 64.0 in | Wt 238.0 lb

## 2016-09-29 DIAGNOSIS — Z170 Estrogen receptor positive status [ER+]: Principal | ICD-10-CM

## 2016-09-29 DIAGNOSIS — M2550 Pain in unspecified joint: Secondary | ICD-10-CM

## 2016-09-29 DIAGNOSIS — Z90710 Acquired absence of both cervix and uterus: Secondary | ICD-10-CM

## 2016-09-29 DIAGNOSIS — Z791 Long term (current) use of non-steroidal anti-inflammatories (NSAID): Secondary | ICD-10-CM

## 2016-09-29 DIAGNOSIS — C50211 Malignant neoplasm of upper-inner quadrant of right female breast: Secondary | ICD-10-CM

## 2016-09-29 DIAGNOSIS — Z79810 Long term (current) use of selective estrogen receptor modulators (SERMs): Secondary | ICD-10-CM

## 2016-09-29 DIAGNOSIS — F17210 Nicotine dependence, cigarettes, uncomplicated: Secondary | ICD-10-CM

## 2016-09-29 DIAGNOSIS — Z923 Personal history of irradiation: Secondary | ICD-10-CM

## 2016-09-29 DIAGNOSIS — Z006 Encounter for examination for normal comparison and control in clinical research program: Secondary | ICD-10-CM | POA: Diagnosis not present

## 2016-09-29 DIAGNOSIS — Z90722 Acquired absence of ovaries, bilateral: Secondary | ICD-10-CM

## 2016-09-29 DIAGNOSIS — A630 Anogenital (venereal) warts: Secondary | ICD-10-CM

## 2016-09-29 NOTE — Assessment & Plan Note (Signed)
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 Clinically no evidence of recurrence. Control arm of PALLAS study. Mammogram July 2017-N.   # Muscle cramps and leg cramps- likely from tamoxifen-improved;continue- vitamin D 50,000 units once a week.  # Joint pains- sec to tamoxifen ?; trial of chondroitin/ glucosamine.   # recommend diet/ weight loss; exercise.   # Follow-up with me in 3 months/discussed with Paula [name].

## 2016-09-29 NOTE — Progress Notes (Signed)
[place] OFFICE PROGRESS NOTE  Patient Care Team: Gearldine Shown, DO as PCP - General (Family Medicine) Robert Bellow, MD (General Surgery) Gearldine Shown, DO as Referring Physician (Family Medicine)   SUMMARY OF ONCOLOGIC HISTORY:  Oncology History   # 2016- BREAST CANCER  STAGE II T1 cN1 M0 [s/p Lumpec & RT]ER/PR- Pos; HER-2/neu receptor negative. Mammaprint- Low risk; no chemo; Oct 2016- START TAM; Rogene [place] study/control arm.   # Dec 2016-  TAH + BSO      Carcinoma of upper-inner quadrant of right breast in female, estrogen receptor positive ([place])       INTERVAL HISTORY:  50 year old female patient with above history of stage II breast cancer currently on tamoxifen is here for follow-up. She is also on the control arm of the [place] study.   Patient states her muscle aches/ leg cramps are improved since being on high-dose vitamin D. However she continues to have joint aches and pains; worse with long hours at work.   Otherwise denies any lumps or bumps. Denies any mood swings. Chronic mild swelling left more than right of the lower extremity. Appetite is good. No cough shortness of breath or chest pain.    REVIEW OF SYSTEMS:  A complete 10 point review of system is done which is negative except mentioned above/history of present illness.   PAST MEDICAL HISTORY :  Past Medical History:  Diagnosis Date  . BRCA negative 2016  . Breast cancer ([place]) 05/28/15   pT1c,N1a:2/3 SLN positive for macrometastisi,ER positive, PR positive, HER-2/neu not overexpressed c Radiation  . Dog bite of left lower leg 03/07/15  . HPV in female 2016  . Pneumonia 02/2015  . STD (female)     PAST SURGICAL HISTORY :   Past Surgical History:  Procedure Laterality Date  . ABDOMINAL HYSTERECTOMY  12/01/2015   Procedure: total abdominal hysterectomty, bilateral salpingo- oopharectomy, cystoscopy ;  Surgeon: Benjaman Kindler, MD;  Location: [facility] ORS;  Service:  Gynecology;;  . BREAST BIOPSY Right 6.7.16   Positive  . BREAST LUMPECTOMY WITH SENTINEL LYMPH NODE BIOPSY Right 07/17/2015   Procedure: RIGHT BREAST WIDE EXCISION WITH SENTINEL NODE BIOPSY;  Surgeon: Robert Bellow, MD;  Location: [facility] ORS;  Service: General;  Laterality: Right;  . BREAST SURGERY Right 05/28/15  . COLPOSCOPY  05/28/15   High grade squamous intraepithelial lesion  . LAPAROSCOPIC VAGINAL HYSTERECTOMY WITH SALPINGO OOPHORECTOMY Bilateral 12/01/2015   Procedure: LAPAROSCOPIC ASSISTED VAGINAL HYSTERECTOMY WITH SALPINGO OOPHORECTOMY-converted to;  Surgeon: Benjaman Kindler, MD;  Location: [facility] ORS;  Service: Gynecology;  Laterality: Bilateral;  . WISDOM TOOTH EXTRACTION      FAMILY HISTORY :   Family History  Problem Relation Age of Onset  . Cancer Maternal Aunt 50    breast  . Breast cancer Maternal Aunt     SOCIAL HISTORY:   Social History  Substance Use Topics  . Smoking status: Current Every Day Smoker    Packs/day: 1.00    Years: 36.00    Types: Cigarettes  . Smokeless tobacco: Never Used  . Alcohol use 0.0 oz/week     Comment: occ    ALLERGIES:  has No Known Allergies.  MEDICATIONS:  Current Outpatient Prescriptions  Medication Sig Dispense Refill  . albuterol (PROVENTIL HFA) 108 (90 Base) MCG/ACT inhaler Inhale into the lungs.    Marland Kitchen ibuprofen (ADVIL,MOTRIN) 600 MG tablet Take 1 tablet (600 mg total) by mouth every 6 (six) hours as needed (mild pain). 30 tablet 0  .  Multiple Vitamins-Minerals (MULTIVITAMIN ADULT) CHEW Chew 2 Doses by mouth daily.    . naproxen sodium (ANAPROX) 220 MG tablet Take 220 mg by mouth 2 (two) times daily with a meal.    . oxyCODONE-acetaminophen (PERCOCET/ROXICET) 5-325 MG tablet Take 1-2 tablets by mouth every 4 (four) hours as needed (moderate to severe pain (when tolerating fluids)). 30 tablet 0  . tamoxifen (NOLVADEX) 20 MG tablet TAKE 1 TABLET (20 MG TOTAL) BY MOUTH DAILY. 30 tablet 6  . Vitamin D, Ergocalciferol, (DRISDOL)  50000 units CAPS capsule Take 1 capsule (50,000 Units total) by mouth every 7 (seven) days. 24 capsule 1   No current facility-administered medications for this visit.     PHYSICAL EXAMINATION: ECOG PERFORMANCE STATUS: 0 - Asymptomatic  BP 118/80 (BP Location: Left Arm, Patient Position: Sitting)   Pulse 85   Temp 97.1 F (36.2 C) (Tympanic)   Resp 16   Ht '5\' 4"'$  (1.626 m)   Wt 238 lb (108 kg)   LMP 09/20/2015   BMI 40.85 kg/m   Filed Weights   09/29/16 1003  Weight: 238 lb (108 kg)    GENERAL: Well-nourished well-developed; Alert, no distress and comfortable.   Alone. Obese.  EYES: no pallor or icterus OROPHARYNX: no thrush or ulceration; good dentition  NECK: supple, no masses felt LYMPH:  no palpable lymphadenopathy in the cervical, axillary or inguinal regions LUNGS: clear to auscultation and  No wheeze or crackles HEART/CVS: regular rate & rhythm and no murmurs; 1+ lower extremity edema right side ABDOMEN:abdomen soft, non-tender and normal bowel sounds Musculoskeletal:no cyanosis of digits and no clubbing  PSYCH: alert & oriented x 3 with fluent speech NEURO: no focal motor/sensory deficits SKIN:  no rashes or significant lesions   LABORATORY DATA:  I have reviewed the data as listed    Component Value Date/Time   NA 139 09/22/2016 1058   K 3.9 09/22/2016 1058   CL 107 09/22/2016 1058   CO2 27 09/22/2016 1058   GLUCOSE 112 (H) 09/22/2016 1058   BUN 9 09/22/2016 1058   CREATININE 0.62 09/22/2016 1058   CALCIUM 8.7 (L) 09/22/2016 1058   PROT 6.8 09/22/2016 1058   ALBUMIN 3.8 09/22/2016 1058   AST 22 09/22/2016 1058   ALT 22 09/22/2016 1058   ALKPHOS 66 09/22/2016 1058   BILITOT 0.1 (L) 09/22/2016 1058   GFRNONAA >60 09/22/2016 1058   GFRAA >60 09/22/2016 1058    No results found for: SPEP, UPEP  Lab Results  Component Value Date   WBC 6.6 09/22/2016   NEUTROABS 4.4 09/22/2016   HGB 13.0 09/22/2016   HCT 38.7 09/22/2016   MCV 91.2 09/22/2016    PLT 226 09/22/2016      Chemistry      Component Value Date/Time   NA 139 09/22/2016 1058   K 3.9 09/22/2016 1058   CL 107 09/22/2016 1058   CO2 27 09/22/2016 1058   BUN 9 09/22/2016 1058   CREATININE 0.62 09/22/2016 1058      Component Value Date/Time   CALCIUM 8.7 (L) 09/22/2016 1058   ALKPHOS 66 09/22/2016 1058   AST 22 09/22/2016 1058   ALT 22 09/22/2016 1058   BILITOT 0.1 (L) 09/22/2016 1058         ASSESSMENT & PLAN:   Carcinoma of upper-inner quadrant of right breast in female, estrogen receptor positive ([place]) # Right Stage II Breast ca; ER/PR positive HER-2/neu negative currently on tamoxifen on adjuvant basis. Clinically no evidence of recurrence. Control  arm of PALLAS study. Mammogram July 2017-N.   # Muscle cramps and leg cramps- likely from tamoxifen-improved;continue- vitamin D 50,000 units once a week.  # Joint pains- sec to tamoxifen ?; trial of chondroitin/ glucosamine.   # recommend diet/ weight loss; exercise.   # Follow-up with me in 3 months/discussed with Zigmund Daniel.      Cammie Sickle, MD 09/29/2016 11:17 AM

## 2016-09-29 NOTE — Progress Notes (Signed)
Patient returns to clinic this morning for H&P related to her cycle 9 visit. She reports to Dr. Rogue Bussing that she is doing well on her Tamoxifen except that she is gaining weight and continues to experience joint aches. She also voices concern that she has gained 18 pounds since she began taking the Tamoxifen almost a year ago (10/15/15). Dr. Rogue Bussing had suggested that she try prescription vitamin D to see if that helps with the joint pain, and Ms. Naro states that is did. 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 Dr. Rogue Bussing counseled patient on trying to eat a healthier diet and get regular exercise in order to try to lose this extra weight, and states that this is as important to preventing cancer recurrence as taking the Tamoxifen is. Patient admittedly under added stress right now due to her daughter's upcoming wedding in 3 days.  Ms. Lofthouse reports that her joint pain was originally just in her knees and ankles, but now is affecting her hips and occasionally her shoulders. Dr. B suggested that she try OTC glucosamine & chondroitin to see if this helps with the joint pain and he wrote this down for her as well. Patient returned completed med diaries last week. She has previously been given her medication diary for cycle 9, and was given diaries for cycles 10&11 today. She will return to clinic on 12/15/16 for Cycle 12 visit. AEs with grade and attribution largely the same and are as follows:  Adverse Event Log  Study/Protocol: AFT-05 PALLAS Cycle: 6-8 (reported at C9 visit)  Event Grade Onset Date Resolved Date Drug Name Attribution Treatment Comments  Joint pain Grade 1 09/2015  Tamoxifen Probably  increasing  Hot flashes Grade 1 09/2015  Tamoxifen Probably  Pt declined tx  Night sweats Grade 1 09/2015  Tamoxifen Probably    Muscle cramps Grade 1 02/21/16  Tamoxifen Possible Rx for high dose Vitamin D 09/29/16 suggested otc  glucosamine & chondroitin Reports improved with Vit.D  Weight gain Grade 1 02/04/16  Tamoxifen Possible  Weight up 18lbs  Intermittant tingling in 4th & 5th fingers on left hand Grade 1 03/2016  Tamoxifen Possible    Edema in BLE Grade 1 Reported 06/30/16  Tamoxifen Unrelated  States swelling in evenings  [name], BSN, [place], OCN 09/29/2016 10:23 AM

## 2016-09-29 NOTE — Progress Notes (Signed)
July was last Mammo.

## 2016-10-01 ENCOUNTER — Encounter: Payer: Self-pay | Admitting: *Deleted

## 2016-10-25 ENCOUNTER — Ambulatory Visit: Payer: Managed Care, Other (non HMO) | Admitting: Radiation Oncology

## 2016-12-03 ENCOUNTER — Other Ambulatory Visit: Payer: Self-pay | Admitting: Oncology

## 2016-12-15 ENCOUNTER — Inpatient Hospital Stay: Payer: Managed Care, Other (non HMO) | Attending: Oncology | Admitting: Oncology

## 2016-12-15 ENCOUNTER — Ambulatory Visit: Payer: Managed Care, Other (non HMO) | Admitting: Internal Medicine

## 2016-12-15 ENCOUNTER — Other Ambulatory Visit: Payer: Managed Care, Other (non HMO)

## 2016-12-15 ENCOUNTER — Ambulatory Visit
Admission: RE | Admit: 2016-12-15 | Discharge: 2016-12-15 | Disposition: A | Discharge status: Home / Self Care | Payer: Managed Care, Other (non HMO) | Source: Ambulatory Visit | Attending: Radiation Oncology | Admitting: Radiation Oncology

## 2016-12-15 ENCOUNTER — Encounter: Payer: Self-pay | Admitting: Radiation Oncology

## 2016-12-15 ENCOUNTER — Inpatient Hospital Stay: Payer: Managed Care, Other (non HMO)

## 2016-12-15 VITALS — BP 129/78 | HR 88 | Temp 97.7°F | Wt 250.2 lb

## 2016-12-15 VITALS — BP 126/78 | HR 87 | Temp 99.4°F | Wt 250.3 lb

## 2016-12-15 DIAGNOSIS — Z923 Personal history of irradiation: Secondary | ICD-10-CM | POA: Insufficient documentation

## 2016-12-15 DIAGNOSIS — C50211 Malignant neoplasm of upper-inner quadrant of right female breast: Secondary | ICD-10-CM

## 2016-12-15 DIAGNOSIS — A630 Anogenital (venereal) warts: Secondary | ICD-10-CM | POA: Diagnosis not present

## 2016-12-15 DIAGNOSIS — Z170 Estrogen receptor positive status [ER+]: Principal | ICD-10-CM

## 2016-12-15 DIAGNOSIS — C50911 Malignant neoplasm of unspecified site of right female breast: Secondary | ICD-10-CM | POA: Insufficient documentation

## 2016-12-15 DIAGNOSIS — Z79899 Other long term (current) drug therapy: Secondary | ICD-10-CM

## 2016-12-15 DIAGNOSIS — Z006 Encounter for examination for normal comparison and control in clinical research program: Secondary | ICD-10-CM | POA: Insufficient documentation

## 2016-12-15 DIAGNOSIS — Z79810 Long term (current) use of selective estrogen receptor modulators (SERMs): Secondary | ICD-10-CM | POA: Insufficient documentation

## 2016-12-15 DIAGNOSIS — R635 Abnormal weight gain: Secondary | ICD-10-CM

## 2016-12-15 DIAGNOSIS — F17210 Nicotine dependence, cigarettes, uncomplicated: Secondary | ICD-10-CM | POA: Diagnosis not present

## 2016-12-15 LAB — COMPREHENSIVE METABOLIC PANEL
ALK PHOS: 67 U/L (ref 38–126)
ALT: 25 U/L (ref 14–54)
AST: 24 U/L (ref 15–41)
Albumin: 3.5 g/dL (ref 3.5–5.0)
Anion gap: 6 (ref 5–15)
BILIRUBIN TOTAL: 0.3 mg/dL (ref 0.3–1.2)
BUN: 11 mg/dL (ref 6–20)
CHLORIDE: 103 mmol/L (ref 101–111)
CO2: 28 mmol/L (ref 22–32)
Calcium: 8.9 mg/dL (ref 8.9–10.3)
Creatinine, Ser: 0.78 mg/dL (ref 0.44–1.00)
Glucose, Bld: 120 mg/dL — ABNORMAL HIGH (ref 65–99)
Potassium: 4.3 mmol/L (ref 3.5–5.1)
Sodium: 137 mmol/L (ref 135–145)
Total Protein: 6.8 g/dL (ref 6.5–8.1)

## 2016-12-15 LAB — CBC WITH DIFFERENTIAL/PLATELET
Basophils Absolute: 0.1 10*3/uL (ref 0–0.1)
Basophils Relative: 1 %
EOS PCT: 2 %
Eosinophils Absolute: 0.1 10*3/uL (ref 0–0.7)
HEMATOCRIT: 38.3 % (ref 35.0–47.0)
Hemoglobin: 12.8 g/dL (ref 12.0–16.0)
LYMPHS ABS: 1.7 10*3/uL (ref 1.0–3.6)
LYMPHS PCT: 23 %
MCH: 30.5 pg (ref 26.0–34.0)
MCHC: 33.6 g/dL (ref 32.0–36.0)
MCV: 90.9 fL (ref 80.0–100.0)
Monocytes Absolute: 0.5 10*3/uL (ref 0.2–0.9)
Monocytes Relative: 7 %
NEUTROS ABS: 5.1 10*3/uL (ref 1.4–6.5)
Neutrophils Relative %: 67 %
PLATELETS: 245 10*3/uL (ref 150–440)
RBC: 4.21 MIL/uL (ref 3.80–5.20)
RDW: 15.4 % — ABNORMAL HIGH (ref 11.5–14.5)
WBC: 7.6 10*3/uL (ref 3.6–11.0)

## 2016-12-15 NOTE — Progress Notes (Signed)
Radiation Oncology Follow up Note  Name: Paula [name]   Date:   12/15/2016 MRN:  [id number] DOB: [birth year]    This 50 y.o. female presents to the clinic today for 1 year follow-up status post whole breast radiation for stage II ER/PR positive invasive mammary carcinoma the right breast.  REFERRING PROVIDER: Robert Bellow, MD  HPI: Patient is a 50 year old female now out 1 year having clear whole breast radiation to her right breast for stage II (T1 CN I M0) invasive mammary carcinoma status post wide local excision and adjuvant radiation therapy. She is seen today in routine follow-up and is doing well. She specifically denies breast tenderness cough or bone pain. She's currently on tamoxifen tolerating that well without side effect.. Her most recent mammograms back in June 2017 were benign.  COMPLICATIONS OF TREATMENT: none  FOLLOW UP COMPLIANCE: keeps appointments   PHYSICAL EXAM:  BP 129/78   Pulse 88   Temp 97.7 F (36.5 C)   Wt 250 lb 3.6 oz (113.5 kg)   LMP 09/20/2015   BMI 42.95 kg/m  Right breast has a horizontal incision which is well-healed no dominant mass or nodularity is noted in either breast in 2 positions examined no axillary or supraclavicular adenopathy is identified. Well-developed well-nourished patient in NAD. HEENT reveals PERLA, EOMI, discs not visualized.  Oral cavity is clear. No oral mucosal lesions are identified. Neck is clear without evidence of cervical or supraclavicular adenopathy. Lungs are clear to A&P. Cardiac examination is essentially unremarkable with regular rate and rhythm without murmur rub or thrill. Abdomen is benign with no organomegaly or masses noted. Motor sensory and DTR levels are equal and symmetric in the upper and lower extremities. Cranial nerves II through XII are grossly intact. Proprioception is intact. No peripheral adenopathy or edema is identified. No motor or sensory levels are noted. Crude visual fields are within normal  range.  RADIOLOGY RESULTS: Most recent mammograms are reviewed and compatible above-stated findings  PLAN: Patient's now out 1 year and doing well with no evidence of disease. She continues on tamoxifen with only minor side effects of occasional hot flashes and joint pain. She's a rescheduled for follow-up mammograms. I'm please were overall progress. I have asked to see her back in 1 year for follow-up. Patient is to call sooner with any concerns.  I would like to take this opportunity to thank you for allowing me to participate in the care of your patient.Armstead Peaks., MD

## 2016-12-15 NOTE — Progress Notes (Signed)
Patient here for follow up.  Patient has no new concerns today

## 2016-12-15 NOTE — Progress Notes (Signed)
[place] OFFICE PROGRESS NOTE  Patient Care Team: Gearldine Shown, DO as PCP - General (Family Medicine) Robert Bellow, MD (General Surgery) Gearldine Shown, DO as Referring Physician (Family Medicine)   SUMMARY OF ONCOLOGIC HISTORY:  Oncology History   # 2016- BREAST CANCER  STAGE II T1 cN1 M0 [s/p Lumpec & RT]ER/PR- Pos; HER-2/neu receptor negative. Mammaprint- Low risk; no chemo; Oct 2016- START TAM; Rogene [place] study/control arm.   # Dec 2016-  TAH + BSO      Carcinoma of upper-inner quadrant of right breast in female, estrogen receptor positive ([place])       INTERVAL HISTORY:  50 year old female patient with above history of stage II breast cancer currently on tamoxifen is here for follow-up. She is also on the control arm of the [place] study.   Patient states her muscle aches/ leg cramps are improved since being on high-dose vitamin D and trialing chondroitin and glucosamine. She additionally complains of mild hot flashes but does not seem to notice them as much during the wintertime. She has very concerned about her weight and wishes to see a dietitian  Otherwise denies any lumps or bumps. Denies any mood swings. Chronic mild swelling left more than right of the lower extremity. Appetite is good. No cough shortness of breath or chest pain.    REVIEW OF SYSTEMS:  A complete 10 point review of system is done which is negative except mentioned above/history of present illness.   PAST MEDICAL HISTORY :  Past Medical History:  Diagnosis Date  . BRCA negative 2016  . Breast cancer ([place]) 05/28/15   pT1c,N1a:2/3 SLN positive for macrometastisi,ER positive, PR positive, HER-2/neu not overexpressed c Radiation  . Dog bite of left lower leg 03/07/15  . HPV in female 2016  . Pneumonia 02/2015  . STD (female)     PAST SURGICAL HISTORY :   Past Surgical History:  Procedure Laterality Date  . ABDOMINAL HYSTERECTOMY  12/01/2015   Procedure:  total abdominal hysterectomty, bilateral salpingo- oopharectomy, cystoscopy ;  Surgeon: Benjaman Kindler, MD;  Location: [facility] ORS;  Service: Gynecology;;  . BREAST BIOPSY Right 6.7.16   Positive  . BREAST LUMPECTOMY WITH SENTINEL LYMPH NODE BIOPSY Right 07/17/2015   Procedure: RIGHT BREAST WIDE EXCISION WITH SENTINEL NODE BIOPSY;  Surgeon: Robert Bellow, MD;  Location: [facility] ORS;  Service: General;  Laterality: Right;  . BREAST SURGERY Right 05/28/15  . COLPOSCOPY  05/28/15   High grade squamous intraepithelial lesion  . LAPAROSCOPIC VAGINAL HYSTERECTOMY WITH SALPINGO OOPHORECTOMY Bilateral 12/01/2015   Procedure: LAPAROSCOPIC ASSISTED VAGINAL HYSTERECTOMY WITH SALPINGO OOPHORECTOMY-converted to;  Surgeon: Benjaman Kindler, MD;  Location: [facility] ORS;  Service: Gynecology;  Laterality: Bilateral;  . WISDOM TOOTH EXTRACTION      FAMILY HISTORY :   Family History  Problem Relation Age of Onset  . Cancer Maternal Aunt 50    breast  . Breast cancer Maternal Aunt     SOCIAL HISTORY:   Social History  Substance Use Topics  . Smoking status: Current Every Day Smoker    Packs/day: 1.00    Years: 36.00    Types: Cigarettes  . Smokeless tobacco: Never Used  . Alcohol use 0.0 oz/week     Comment: occ    ALLERGIES:  has No Known Allergies.  MEDICATIONS:  Current Outpatient Prescriptions  Medication Sig Dispense Refill  . ibuprofen (ADVIL,MOTRIN) 600 MG tablet Take 1 tablet (600 mg total) by mouth every 6 (six) hours as needed (mild  pain). 30 tablet 0  . Multiple Vitamins-Minerals (MULTIVITAMIN ADULT) CHEW Chew 2 Doses by mouth daily.    . naproxen sodium (ANAPROX) 220 MG tablet Take 220 mg by mouth 2 (two) times daily with a meal.    . oxyCODONE-acetaminophen (PERCOCET/ROXICET) 5-325 MG tablet Take 1-2 tablets by mouth every 4 (four) hours as needed (moderate to severe pain (when tolerating fluids)). 30 tablet 0  . tamoxifen (NOLVADEX) 20 MG tablet TAKE 1 TABLET (20 MG TOTAL) BY MOUTH  DAILY. 30 tablet 0  . Vitamin D, Ergocalciferol, (DRISDOL) 50000 units CAPS capsule Take 1 capsule (50,000 Units total) by mouth every 7 (seven) days. 24 capsule 1   No current facility-administered medications for this visit.     PHYSICAL EXAMINATION: ECOG PERFORMANCE STATUS: 0 - Asymptomatic  BP 126/78 (BP Location: Left Arm, Patient Position: Sitting)   Pulse 87   Temp 99.4 F (37.4 C) (Tympanic)   Wt 250 lb 5.3 oz (113.6 kg)   LMP 09/20/2015   BMI 42.97 kg/m   Filed Weights   12/15/16 1110  Weight: 250 lb 5.3 oz (113.6 kg)    GENERAL: Well-nourished well-developed; Alert, no distress and comfortable.   Alone. Obese.  EYES: no pallor or icterus OROPHARYNX: no thrush or ulceration; good dentition  NECK: supple, no masses felt LYMPH:  no palpable lymphadenopathy in the cervical, axillary or inguinal regions LUNGS: clear to auscultation and  No wheeze or crackles HEART/CVS: regular rate & rhythm and no murmurs; 1+ lower extremity edema right side ABDOMEN:abdomen soft, non-tender and normal bowel sounds Musculoskeletal:no cyanosis of digits and no clubbing  PSYCH: alert & oriented x 3 with fluent speech NEURO: no focal motor/sensory deficits SKIN:  no rashes or significant lesions   LABORATORY DATA:  I have reviewed the data as listed    Component Value Date/Time   NA 137 12/15/2016 1022   K 4.3 12/15/2016 1022   CL 103 12/15/2016 1022   CO2 28 12/15/2016 1022   GLUCOSE 120 (H) 12/15/2016 1022   BUN 11 12/15/2016 1022   CREATININE 0.78 12/15/2016 1022   CALCIUM 8.9 12/15/2016 1022   PROT 6.8 12/15/2016 1022   ALBUMIN 3.5 12/15/2016 1022   AST 24 12/15/2016 1022   ALT 25 12/15/2016 1022   ALKPHOS 67 12/15/2016 1022   BILITOT 0.3 12/15/2016 1022   GFRNONAA >60 12/15/2016 1022   GFRAA >60 12/15/2016 1022    No results found for: SPEP, UPEP  Lab Results  Component Value Date   WBC 7.6 12/15/2016   NEUTROABS 5.1 12/15/2016   HGB 12.8 12/15/2016   HCT 38.3  12/15/2016   MCV 90.9 12/15/2016   PLT 245 12/15/2016      Chemistry      Component Value Date/Time   NA 137 12/15/2016 1022   K 4.3 12/15/2016 1022   CL 103 12/15/2016 1022   CO2 28 12/15/2016 1022   BUN 11 12/15/2016 1022   CREATININE 0.78 12/15/2016 1022      Component Value Date/Time   CALCIUM 8.9 12/15/2016 1022   ALKPHOS 67 12/15/2016 1022   AST 24 12/15/2016 1022   ALT 25 12/15/2016 1022   BILITOT 0.3 12/15/2016 1022         ASSESSMENT & PLAN:   # Right Stage II Breast ca; ER/PR positive HER-2/neu negative currently on tamoxifen on adjuvant basis. Clinically no evidence of recurrence. Control arm of PALLAS study.    # Muscle cramps and leg cramps- likely from tamoxifen-improved;continue- vitamin D  50,000 units once a week.  # Joint pains- Better since beginning chondroitin/ glucosamine 3 months ago.   # recommend diet/ weight loss; exercise. Referral to dietician made.  # Follow-up with Dr. Jacinto Reap in 3 months/discussed with Marcie Bal.    Faythe Casa, NP 12/15/16 12:30PM   Patient was seen and evaluated independently and I agree with the assessment and plan as dictated above.Continue follow-up and treatment as per clinical trial.  Lloyd Huger, MD 12/17/16 12:13 PM

## 2016-12-15 NOTE — Progress Notes (Signed)
The patient returns to clinic today for H&P related to her cycle 12 visit. She reports to Faythe Casa, NP and to Dr. Grayland Ormond that she is doing well on her Tamoxifen. She does report that she has gained 30lbs since starting the medication and she is concerned about that. She continues to have joint aches and muscle cramps however, since starting the Vit D and glucosamine & chondroitin it has improved some. 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  She also reports continuing night sweats and hot flashes. Dr. Grayland Ormond suggested trying Effexor for the symptoms but the patient refused saying that she doesn't like to take pills and does not want any additional side effects. The patient's daughter got married in October which has relieved a lot of her stress.  The patient also has swelling (non pitting) of BLE  R>L and she feels its due to walking a lot at work in addition to being on the concrete floors. CBC and CMET were within acceptable parameters, HgbA1c not resulted today.  The patient returned the completed medication diaries for cycles 9, 10 and 11 and she was given new diaries for cycles 12, 13 and 14. She completed the PRO and the Medication Adherence Questionnaires in the clinic today.   She was given an appointment to see the dietician on December 27, 2016 and a return appointment for labs and to see Dr. Rogue Bussing on March 16, 2017.   AE's and attributions are listed below: Mirian Mo, RN, BSN 12/15/2016 12:25 PM   Adverse Event Log  Study/Protocol: AFT-05 PALLAS Cycle: 9-11 (reported at C12 visit)  Event Grade Onset Date Resolved Date Drug Name Attribution Treatment Comments  Joint pain Grade 1 09/2015  Tamoxifen Probably  Improved  Hot flashes Grade 1 09/2015  Tamoxifen Probably  Pt declined tx (Effexor)  Night sweats Grade 1 09/2015  Tamoxifen Probably  Pt declined tx (Effexor)  Muscle cramps Grade 1 02/21/16  Tamoxifen Possible Rx for high dose  Vitamin D 09/29/16 suggested otc glucosamine & chondroitin Reports improved with Vit.D and with Glucosamine & Chondroitin  Weight gain Grade 2 02/04/16  Tamoxifen Possible  Weight up 30lbs (14%increase), referral to dietician  Intermittant tingling in 4th & 5th fingers on left hand Grade 1 03/2016  Tamoxifen Possible  Improved  Edema in BLE Grade 1 Reported 06/30/16  Tamoxifen Unrelated  Swelling (non pitting edema) noted in clinic today  R>L   Mirian Mo, RN, BSN 12/15/2016 12:26 PM

## 2016-12-16 LAB — HEMOGLOBIN A1C
HEMOGLOBIN A1C: 6.5 % — AB (ref 4.8–5.6)
MEAN PLASMA GLUCOSE: 140 mg/dL

## 2016-12-22 ENCOUNTER — Telehealth: Payer: Self-pay | Admitting: *Deleted

## 2016-12-22 NOTE — Telephone Encounter (Signed)
T/C made to Paula [name] for purpose of rescheduling her next appointment to 03/09/17. Patient agrees that she can make it on that date and that she will ask to be off work. She is also questioning whether research is paying for her study related visits and states she just received a bill for $191.00. Instructed that research is paying for the visits that are above and beyond the standard of care visits. [name] reports her insurance is denying all of her follow up visits and she wonders how they are being coded. Informed that I will talk to our billing department and see if they can help [place] with the insurance issue. [name], BSN, MHA, OCN 12/22/2016 12:53 PM

## 2016-12-23 ENCOUNTER — Telehealth: Payer: Self-pay | Admitting: *Deleted

## 2016-12-23 NOTE — Telephone Encounter (Signed)
T/C made back to Paula [name] to inform of her new appointment date and time. Spoke with patient and instructed her that her new appointment is on 03/09/17 at 10:45 am. Patient states this is OK and she will ask for the day off work. Also informed [name] that I have emailed our billing department regarding her question, but have not received a response back yet. [name], BSN, MHA, OCN 12/23/2016 4:35 PM

## 2016-12-27 ENCOUNTER — Other Ambulatory Visit: Payer: Self-pay | Admitting: *Deleted

## 2016-12-27 ENCOUNTER — Inpatient Hospital Stay: Payer: Managed Care, Other (non HMO) | Attending: Internal Medicine

## 2016-12-27 DIAGNOSIS — Z170 Estrogen receptor positive status [ER+]: Principal | ICD-10-CM

## 2016-12-27 DIAGNOSIS — C50211 Malignant neoplasm of upper-inner quadrant of right female breast: Secondary | ICD-10-CM

## 2016-12-27 NOTE — Progress Notes (Signed)
Nutrition Assessment   Reason for Assessment:   Referral for weight loss  ASSESSMENT:  51 year old female s/p 1 year in remission for right breast cancer.  Patient currently on tamoxifen. Past medical history reviewed.  Patient reports has gained 30 pounds.  Reports that she loves pasta and carbohydrates. Drinks regular soda and sweet tea on a regular basis.   Does not eat on regular basis.     Medications: MVI, tamoxifen, Vit D  Labs: reviewed  Anthropometrics:   Height: 64 inches Weight: 250 lb BMI: 40.9   NUTRITION DIAGNOSIS: Food and nutrition related knowledge deficit related to weight gain as evidenced by interest in weight loss tips and techniques.    INTERVENTION:   Discussed plant based diet recommendations for breast cancer survivors and how this can help patient meet weight loss efforts.  Handout provided. Also discussed weight loss strategies (ie replacing high calorie beverages with lower or no calorie options, meal planning, healthy snacking, reducing portion sizes)  Would recommend referral to [facility] for further weight loss education and close follow-up.     MONITORING, EVALUATION, GOAL: Patient will meet decrease calories to promote weight loss   NEXT VISIT: Patient to follow-up with RD at [place]. Zenia Resides, [place], [place] (pager)

## 2016-12-28 NOTE — Addendum Note (Signed)
Addended by: Jennet Maduro B on: 12/28/2016 04:07 PM   Modules accepted: Orders

## 2016-12-30 ENCOUNTER — Other Ambulatory Visit: Payer: Self-pay | Admitting: *Deleted

## 2016-12-30 MED ORDER — TAMOXIFEN CITRATE 20 MG PO TABS: ORAL_TABLET | ORAL | 1 refills | Status: DC

## 2016-12-31 ENCOUNTER — Ambulatory Visit: Payer: Managed Care, Other (non HMO) | Admitting: Oncology

## 2016-12-31 ENCOUNTER — Other Ambulatory Visit: Payer: Managed Care, Other (non HMO)

## 2016-12-31 ENCOUNTER — Other Ambulatory Visit: Payer: Self-pay | Admitting: *Deleted

## 2016-12-31 NOTE — Telephone Encounter (Signed)
Requesting 90 day supply.

## 2017-01-26 ENCOUNTER — Encounter: Payer: Managed Care, Other (non HMO) | Attending: Oncology | Admitting: Dietician

## 2017-01-26 ENCOUNTER — Encounter: Payer: Self-pay | Admitting: Dietician

## 2017-01-26 VITALS — Ht 63.0 in | Wt 254.1 lb

## 2017-01-26 DIAGNOSIS — C50211 Malignant neoplasm of upper-inner quadrant of right female breast: Secondary | ICD-10-CM

## 2017-01-26 DIAGNOSIS — E661 Drug-induced obesity: Secondary | ICD-10-CM

## 2017-01-26 DIAGNOSIS — Z6841 Body Mass Index (BMI) 40.0 and over, adult: Principal | ICD-10-CM

## 2017-01-26 NOTE — Patient Instructions (Addendum)
-  Increase water intake, decrease Coke intake (# of servings) throughout the day -Start exercise program 1x/wk -Swap out snack choices for more nutrient dense/ lower fat options -Wake up early enough to make breakfast occasionally

## 2017-01-26 NOTE — Progress Notes (Signed)
Medical Nutrition Therapy: Visit start time: 9147  end time: 1630  Assessment:  Diagnosis: Obesity Past medical history: Carcinoma of the breast Psychosocial issues/ stress concerns: none Preferred learning method: . No preference indicated  Current weight: 254.1lb Height: 5' 3"  Medications, supplements: MVI, Vit D3, Tamoxofen, Naproxen prn  Progress and evaluation: Weight has increased ~30# since starting Tamoxofen medication per patient. Unhappy with weight gained and is worried about developing prediabetes (HgbA1c 6.2 in January 2018). Requests to discuss weight loss, portion sizes, and general healthy eating habits. Identified sources of excess calories: sugar-sweetened beverages and high-fat meats and dairy products. Minimal vegetable and fruit intake. Discussed role of a balanced diet in providing fiber, vitamins/ minerals, antioxidants. Introduced substitutions for snacks and ingredient swaps in recipes such as applesauce vs oil or greek yogurt vs sour cream or mayo. Provided information on proper portion sizes in all food groups as well as guidelines for daily intake of each macronutrient to meet goal of sustainable weight loss (.5-1#/wk). Also discussed role of exercise in weight loss and provided forms of cardiovascular exercise which are low-impact to limit chronic joint (knee and foot) pain.  Physical activity: has a membership to [employer] but hasn't gone in a year. Plans to start going again soon.  Dietary Intake:  Usual eating pattern includes 2 meals and 2-3 snacks per day. Dining out frequency: 1-3 meals per week.  Breakfast: snack foods- slim jims, pork rinds, chips, micky mouse snack packs (pretzels, apples, cheese) Snack: - Lunch: depends, leftovers like chicken, mac n cheese, pasta frequently Snack: varies Supper: sandwich, chips, chicken, mashed potatoes, butter beans Snack: occasionally, fruit popsicles or other snack item Beverages: coke (non-diet) up to 2L/day,  sweet tea, minimal water  Nutrition Care Education: Topics covered: weight loss and general healthy eating Basic nutrition: basic food groups, appropriate nutrient balance, appropriate meal and snack schedule, general nutrition guidelines    Weight control: benefits of weight control, behavioral changes for weight loss Advanced nutrition:  recipe modification, cooking techniques, food label reading Other lifestyle changes:  benefits of making changes, readiness for change, identifying habits that need to change  Nutritional Diagnosis:  Solomon-3.4 Unintentional weight gain As related to new medication, menopause, dietary habits.  As evidenced by patient report of 30# wt gain.  Intervention: Discussion as noted above. Nutrition education handouts provided. Food models used to demonstrate portion sizes.  Education Materials given:  . Plate Planner . Food lists/ Planning A Balanced Meal . Snacking handout . Goals/ instructions: Listed on AVS . Nutrition for Cancer patients  Learner/ who was taught:  . Patient   Level of understanding: Marland Kitchen Verbalizes/ demonstrates competency  Demonstrated degree of understanding via:   Teach back Learning barriers: . None  Willingness to learn/ readiness for change: . Acceptance, ready for change  Monitoring and Evaluation:  Dietary intake, exercise, portion sizes, and body weight      follow up: prn

## 2017-02-23 ENCOUNTER — Other Ambulatory Visit: Payer: Self-pay | Admitting: Internal Medicine

## 2017-02-23 DIAGNOSIS — C50211 Malignant neoplasm of upper-inner quadrant of right female breast: Secondary | ICD-10-CM

## 2017-03-08 ENCOUNTER — Other Ambulatory Visit: Payer: Self-pay | Admitting: *Deleted

## 2017-03-08 DIAGNOSIS — C50211 Malignant neoplasm of upper-inner quadrant of right female breast: Secondary | ICD-10-CM

## 2017-03-08 DIAGNOSIS — Z170 Estrogen receptor positive status [ER+]: Principal | ICD-10-CM

## 2017-03-09 ENCOUNTER — Encounter: Payer: Self-pay | Admitting: *Deleted

## 2017-03-09 ENCOUNTER — Inpatient Hospital Stay: Payer: Managed Care, Other (non HMO) | Attending: Internal Medicine

## 2017-03-09 ENCOUNTER — Inpatient Hospital Stay (HOSPITAL_BASED_OUTPATIENT_CLINIC_OR_DEPARTMENT_OTHER): Payer: Managed Care, Other (non HMO) | Admitting: Internal Medicine

## 2017-03-09 VITALS — BP 134/86 | HR 83 | Temp 97.6°F | Resp 20 | Ht 63.0 in | Wt 253.0 lb

## 2017-03-09 DIAGNOSIS — F17210 Nicotine dependence, cigarettes, uncomplicated: Secondary | ICD-10-CM | POA: Insufficient documentation

## 2017-03-09 DIAGNOSIS — Z79810 Long term (current) use of selective estrogen receptor modulators (SERMs): Secondary | ICD-10-CM | POA: Diagnosis not present

## 2017-03-09 DIAGNOSIS — C50211 Malignant neoplasm of upper-inner quadrant of right female breast: Secondary | ICD-10-CM

## 2017-03-09 DIAGNOSIS — Z8701 Personal history of pneumonia (recurrent): Secondary | ICD-10-CM | POA: Insufficient documentation

## 2017-03-09 DIAGNOSIS — Z006 Encounter for examination for normal comparison and control in clinical research program: Secondary | ICD-10-CM | POA: Diagnosis not present

## 2017-03-09 DIAGNOSIS — Z79899 Other long term (current) drug therapy: Secondary | ICD-10-CM | POA: Insufficient documentation

## 2017-03-09 DIAGNOSIS — Z170 Estrogen receptor positive status [ER+]: Principal | ICD-10-CM

## 2017-03-09 DIAGNOSIS — R252 Cramp and spasm: Secondary | ICD-10-CM | POA: Insufficient documentation

## 2017-03-09 DIAGNOSIS — Z8619 Personal history of other infectious and parasitic diseases: Secondary | ICD-10-CM | POA: Insufficient documentation

## 2017-03-09 DIAGNOSIS — R635 Abnormal weight gain: Secondary | ICD-10-CM | POA: Insufficient documentation

## 2017-03-09 LAB — CBC WITH DIFFERENTIAL/PLATELET
BASOS PCT: 1 %
Basophils Absolute: 0.1 10*3/uL (ref 0–0.1)
EOS ABS: 0.1 10*3/uL (ref 0–0.7)
EOS PCT: 2 %
HCT: 37.9 % (ref 35.0–47.0)
HEMOGLOBIN: 12.7 g/dL (ref 12.0–16.0)
Lymphocytes Relative: 22 %
Lymphs Abs: 1.6 10*3/uL (ref 1.0–3.6)
MCH: 30.1 pg (ref 26.0–34.0)
MCHC: 33.5 g/dL (ref 32.0–36.0)
MCV: 89.7 fL (ref 80.0–100.0)
MONO ABS: 0.4 10*3/uL (ref 0.2–0.9)
MONOS PCT: 6 %
NEUTROS PCT: 69 %
Neutro Abs: 5.3 10*3/uL (ref 1.4–6.5)
PLATELETS: 243 10*3/uL (ref 150–440)
RBC: 4.23 MIL/uL (ref 3.80–5.20)
RDW: 16.5 % — AB (ref 11.5–14.5)
WBC: 7.6 10*3/uL (ref 3.6–11.0)

## 2017-03-09 LAB — COMPREHENSIVE METABOLIC PANEL
ALBUMIN: 3.5 g/dL (ref 3.5–5.0)
ALT: 29 U/L (ref 14–54)
ANION GAP: 7 (ref 5–15)
AST: 25 U/L (ref 15–41)
Alkaline Phosphatase: 70 U/L (ref 38–126)
BUN: 11 mg/dL (ref 6–20)
CHLORIDE: 103 mmol/L (ref 101–111)
CO2: 28 mmol/L (ref 22–32)
Calcium: 9.3 mg/dL (ref 8.9–10.3)
Creatinine, Ser: 0.77 mg/dL (ref 0.44–1.00)
GFR calc non Af Amer: >60 mL/min (ref >60)
GLUCOSE: 130 mg/dL — AB (ref 65–99)
POTASSIUM: 4.2 mmol/L (ref 3.5–5.1)
Sodium: 138 mmol/L (ref 135–145)
Total Bilirubin: 0.3 mg/dL (ref 0.3–1.2)
Total Protein: 7 g/dL (ref 6.5–8.1)

## 2017-03-09 MED ORDER — TAMOXIFEN CITRATE 20 MG PO TABS: ORAL_TABLET | ORAL | 12 refills | Status: DC

## 2017-03-09 MED ORDER — ANASTROZOLE 1 MG PO TABS: 1.0000 mg | ORAL_TABLET | Freq: Every day | ORAL | 3 refills | Status: DC

## 2017-03-09 NOTE — Progress Notes (Signed)
Paula [name] returns to clinic today for H&P related to her cycle 15 visit. Patient states she is still not doing as well with the Tamoxifen and attributes her joint pain, foot cramps, hot flashes and weight gain to the Tamoxifen. She reports that she has gained 34lbs since starting the Tamoxifen and she is still very concerned and discouraged about that. She continues to have some joint aches and muscle cramps however, since starting the Vit D and glucosamine & chondroitin it has improved some. Patient continues to work a job that requires her to walk on concrete floors all day.  She also reports continuing night sweats and hot flashes and has ongoing swelling (non pitting) of her feet and ankles, which she relates to walking a lot at work on the concrete floors. CBC and METC were within acceptable parameters today. VS remain stable. Dr. Rogue Bussing has performed her H&P and has recommended that the patient stop taking Tamoxifen and switch to an aromatase inhibitor.He is prescribing Arimadex for her. Patient returned the completed medication diaries for cycles 12, 13 & 14 Tamoxifen in exchange for new medication diaries for cycles 15, 16 & 17. No questionnaires were required by the study today.   Patient reports she did meet with the [facility] back in January and they discussed strategies for weight loss, but she has not followed all of these recommendations. [name] will return to see Dr. Rogue Bussing on 06/01/2017 for her 94 month study visit. Current AE's and attributions are listed below:   Adverse Event Log  Study/Protocol: AFT-05 [facility]ALLAS Cycle: 12-15 (reported at Month 15 visit) Event Grade Onset Date Resolved Date Drug Name Attribution Treatment Comments  Joint pain Grade 1 09/2015  Tamoxifen Probably  Improved  Hot flashes Grade 1 09/2015  Tamoxifen Probably  Pt declined tx (Effexor)  Night sweats Grade 1 09/2015  Tamoxifen Probably  Pt declined tx (Effexor)  Muscle cramps Grade 1  02/21/16  Tamoxifen Possible Rx for high dose Vitamin D 09/29/16 suggested otc glucosamine & chondroitin Reports improved with Vit.D and with Glucosamine & Chondroitin  Weight gain Grade 2 02/04/16  Tamoxifen Possible  Weight up 30lbs (14%increase), referral to dietician  Intermittant tingling in 4th & 5th fingers on left hand Grade 1 03/2016  Tamoxifen Possible  Improved  Edema in BLE Grade 1 Reported 06/30/16  Tamoxifen Unrelated  Swelling (non pitting edema) noted in clinic today  R>L  [name], BSN, MHA, OCN 03/09/2017 11:55 AM

## 2017-03-09 NOTE — Progress Notes (Signed)
Patient requesting RF on tamoxifen. RF submitted to pt's pharmacy in [place].

## 2017-03-09 NOTE — Assessment & Plan Note (Addendum)
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 Clinically no evidence of recurrence. Control arm of PALLAS study. Mammogram July 2017-N.   # Patient tolerating tamoxifen with moderate difficulties- especially with severe weight gain. Patient has had oophorectomy/ lymph postmenopausal. Discussed the use of alternative use of aromatase inhibitors-Arimidex/Femara. Also discussed that she might have similar side effects from other anti-hormonal treatments. However it is reasonable to try aromatase inhibitor given the significant weight gain with tamoxifen. Patient is agreeable. New prescription given.  # Muscle cramps and leg cramps- improved/ continue high-dose vitamin D chondroitin glucosamine as needed  # weight gain- > 20 pounds; recommend diet/ weight loss; exercise.  I also discussed these interventions also have had adjuvant therapeutic benefit. Tamoxifen discontinued.   # Follow-up with me in 12 weeks/discussed with Kay/ labs; since patient is in clinical trial- she will see Dr.Rao in my absence.

## 2017-03-09 NOTE — Progress Notes (Signed)
[facility] OFFICE PROGRESS NOTE  Patient Care Team: Gearldine Shown, DO as PCP - General (Family Medicine) Robert Bellow, MD (General Surgery) Gearldine Shown, DO as Referring Physician (Family Medicine)   SUMMARY OF ONCOLOGIC HISTORY:  Oncology History   # 2016- BREAST CANCER  STAGE II T1 cN1 M0 [s/p Lumpec & RT]ER/PR- Pos; HER-2/neu receptor negative. Mammaprint- Low risk; no chemo; Oct 2016- START TAM; [facility] [place] study/control arm.   # MARCH 2018- discon Tam [sec to weight gain]; start Arimidex  # Dec 2016-  TAH + BSO      Carcinoma of upper-inner quadrant of right breast in female, estrogen receptor positive ([place])       INTERVAL HISTORY:  51 year old female patient with above history of stage II breast cancer currently on tamoxifen is here for follow-up. She is also on the control arm of the [place] study.   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  Patient states her muscle aches/ leg cramps are improved since being on high-dose vitamin D/chondroitin sulfate and glucosamine.   Her biggest complaint is weight gain of about 20 pounds in the last 6 months or so. She was losing weight. She attributes this to poor dietary choices /tamoxifen.   Otherwise denies any lumps or bumps. Denies any mood swings. Chronic mild swelling left more than right of the lower extremity. Appetite is good. No cough shortness of breath or chest pain.    REVIEW OF SYSTEMS:  A complete 10 point review of system is done which is negative except mentioned above/history of present illness.   PAST MEDICAL HISTORY :  Past Medical History:  Diagnosis Date  . BRCA negative 2016  . Breast cancer ([place]) 05/28/15   pT1c,N1a:2/3 SLN positive for macrometastisi,ER positive, PR positive, HER-2/neu not overexpressed c Radiation  . Dog bite of left lower leg 03/07/15  . HPV in female 2016   . Pneumonia 02/2015  . STD (female)     PAST SURGICAL HISTORY :   Past Surgical History:  Procedure Laterality Date  . ABDOMINAL HYSTERECTOMY  12/01/2015   Procedure: total abdominal hysterectomty, bilateral salpingo- oopharectomy, cystoscopy ;  Surgeon: Benjaman Kindler, MD;  Location: [facility] ORS;  Service: Gynecology;;  . BREAST BIOPSY Right 6.7.16   Positive  . BREAST LUMPECTOMY WITH SENTINEL LYMPH NODE BIOPSY Right 07/17/2015   Procedure: RIGHT BREAST WIDE EXCISION WITH SENTINEL NODE BIOPSY;  Surgeon: Robert Bellow, MD;  Location: [facility] ORS;  Service: General;  Laterality: Right;  . BREAST SURGERY Right 05/28/15  . COLPOSCOPY  05/28/15   High grade squamous intraepithelial lesion  . LAPAROSCOPIC VAGINAL HYSTERECTOMY WITH SALPINGO OOPHORECTOMY Bilateral 12/01/2015   Procedure: LAPAROSCOPIC ASSISTED VAGINAL HYSTERECTOMY WITH SALPINGO OOPHORECTOMY-converted to;  Surgeon: Benjaman Kindler, MD;  Location: [facility] ORS;  Service: Gynecology;  Laterality: Bilateral;  . WISDOM TOOTH EXTRACTION      FAMILY HISTORY :   Family History  Problem Relation Age of Onset  . Cancer Maternal Aunt 50    breast  . Breast cancer Maternal Aunt     SOCIAL HISTORY:   Social History  Substance Use Topics  . Smoking status: Current Every Day Smoker    Packs/day: 1.00    Years: 36.00    Types: Cigarettes  . Smokeless tobacco: Never Used  . Alcohol use 0.0 oz/week     Comment: occ    ALLERGIES:  has No Known Allergies.  MEDICATIONS:  Current Outpatient Prescriptions  Medication Sig Dispense Refill  . ibuprofen (ADVIL,MOTRIN) 600 MG tablet Take 1 tablet (600 mg total) by mouth every 6 (six) hours as needed (mild pain). 30 tablet 0  . Multiple Vitamins-Minerals (MULTIVITAMIN ADULT) CHEW Chew 2 Doses by mouth daily.    . tamoxifen (NOLVADEX) 20 MG tablet TAKE 1 TABLET (20 MG TOTAL) BY MOUTH DAILY. 30 tablet 12  . Vitamin D, Ergocalciferol, (DRISDOL) 50000 units CAPS capsule TAKE 1 CAPSULE (50,000  UNITS TOTAL) BY MOUTH EVERY 7 (SEVEN) DAYS. 24 capsule 0  . anastrozole (ARIMIDEX) 1 MG tablet Take 1 tablet (1 mg total) by mouth daily. 30 tablet 3  . naproxen sodium (ANAPROX) 220 MG tablet Take 220 mg by mouth 2 (two) times daily with a meal.     No current facility-administered medications for this visit.     PHYSICAL EXAMINATION: ECOG PERFORMANCE STATUS: 0 - Asymptomatic  BP 134/86 (Patient Position: Sitting)   Pulse 83   Temp 97.6 F (36.4 C) (Tympanic)   Resp 20   Ht '5\' 3"'$  (1.6 m)   Wt 253 lb (114.8 kg)   LMP 09/23/2015   BMI 44.82 kg/m   Filed Weights   03/09/17 1108  Weight: 253 lb (114.8 kg)    GENERAL: Well-nourished well-developed; Alert, no distress and comfortable.   Alone. Obese.  EYES: no pallor or icterus OROPHARYNX: no thrush or ulceration; good dentition  NECK: supple, no masses felt LYMPH:  no palpable lymphadenopathy in the cervical, axillary or inguinal regions LUNGS: clear to auscultation and  No wheeze or crackles HEART/CVS: regular rate & rhythm and no murmurs; 1+ lower extremity edema right side ABDOMEN:abdomen soft, non-tender and normal bowel sounds Musculoskeletal:no cyanosis of digits and no clubbing  PSYCH: alert & oriented x 3 with fluent speech NEURO: no focal motor/sensory deficits SKIN:  no rashes or significant lesions   LABORATORY DATA:  I have reviewed the data as listed    Component Value Date/Time   NA 138 03/09/2017 1028   K 4.2 03/09/2017 1028   CL 103 03/09/2017 1028   CO2 28 03/09/2017 1028   GLUCOSE 130 (H) 03/09/2017 1028   BUN 11 03/09/2017 1028   CREATININE 0.77 03/09/2017 1028   CALCIUM 9.3 03/09/2017 1028   PROT 7.0 03/09/2017 1028   ALBUMIN 3.5 03/09/2017 1028   AST 25 03/09/2017 1028   ALT 29 03/09/2017 1028   ALKPHOS 70 03/09/2017 1028   BILITOT 0.3 03/09/2017 1028   GFRNONAA >60 03/09/2017 1028   GFRAA >60 03/09/2017 1028    No results found for: SPEP, UPEP  Lab Results  Component Value Date   WBC  7.6 03/09/2017   NEUTROABS 5.3 03/09/2017   HGB 12.7 03/09/2017   HCT 37.9 03/09/2017   MCV 89.7 03/09/2017   PLT 243 03/09/2017      Chemistry      Component Value Date/Time   NA 138 03/09/2017 1028   K 4.2 03/09/2017 1028   CL 103 03/09/2017 1028   CO2 28 03/09/2017 1028   BUN 11 03/09/2017 1028   CREATININE 0.77 03/09/2017 1028      Component Value Date/Time   CALCIUM 9.3 03/09/2017 1028   ALKPHOS 70 03/09/2017 1028   AST 25 03/09/2017 1028   ALT 29 03/09/2017 1028   BILITOT 0.3 03/09/2017 1028         ASSESSMENT & PLAN:   Carcinoma of upper-inner quadrant of right breast in female, estrogen receptor positive ([place]) #  Right Stage II Breast ca; ER/PR positive HER-2/neu negative currently on tamoxifen on adjuvant basis. Clinically no evidence of recurrence. Control arm of PALLAS study. Mammogram July 2017-N.   # Patient tolerating tamoxifen with moderate difficulties- especially with severe weight gain. Patient has had oophorectomy/ lymph postmenopausal. Discussed the use of alternative use of aromatase inhibitors-Arimidex/Femara. Also discussed that she might have similar side effects from other anti-hormonal treatments. However it is reasonable to try aromatase inhibitor given the significant weight gain with tamoxifen. Patient is agreeable. New prescription given.  # Muscle cramps and leg cramps- improved/ continue high-dose vitamin D chondroitin glucosamine as needed  # weight gain- > 20 pounds; recommend diet/ weight loss; exercise.  I also discussed these interventions also have had adjuvant therapeutic benefit. Tamoxifen discontinued.   # Follow-up with me in 12 weeks/discussed with Kay/ labs; since patient is in clinical trial- she will see Dr.Rao in my absence.      Cammie Sickle, MD 03/09/2017 1:52 PM

## 2017-03-16 ENCOUNTER — Other Ambulatory Visit: Payer: Managed Care, Other (non HMO)

## 2017-03-16 ENCOUNTER — Ambulatory Visit: Payer: Managed Care, Other (non HMO) | Admitting: Internal Medicine

## 2017-05-19 ENCOUNTER — Other Ambulatory Visit: Payer: Self-pay | Admitting: *Deleted

## 2017-05-19 DIAGNOSIS — Z170 Estrogen receptor positive status [ER+]: Principal | ICD-10-CM

## 2017-05-19 DIAGNOSIS — C50211 Malignant neoplasm of upper-inner quadrant of right female breast: Secondary | ICD-10-CM

## 2017-05-26 ENCOUNTER — Ambulatory Visit: Payer: Managed Care, Other (non HMO) | Admitting: Oncology

## 2017-05-26 ENCOUNTER — Other Ambulatory Visit: Payer: Managed Care, Other (non HMO)

## 2017-05-30 ENCOUNTER — Ambulatory Visit: Payer: Managed Care, Other (non HMO) | Admitting: Oncology

## 2017-05-30 ENCOUNTER — Telehealth: Payer: Self-pay | Admitting: *Deleted

## 2017-05-30 ENCOUNTER — Other Ambulatory Visit: Payer: Managed Care, Other (non HMO)

## 2017-05-30 NOTE — Telephone Encounter (Signed)
Received t/c from Paula [name] this AM questioning whether she can take a B12 supplement for her fatigue. She describes unusual increasing fatigue stating she fall asleep now when looking at the computer, watching TV, etc and just feels tired all of the time. Wonders if B12 will help. Inst that it will not hurt, but discussed with patient that she may have something else going on. States she has not seen her PCP in 2 years. Inst that she needs to see her and have her thyroid level checked. Also discussed that her HgbA1C has been rising since she began the research study, and that even though her blood glucose levels have not been extremely elevated, they have been above normal and she could have developed  Diabetes. She will come back to the clinic next week and have a CBC, MetC and HgbA1C collected for the study; however she would need to see her PCP for any treatment that might be required, as well as to check her thyroid level and any other possible underlying cause of her fatigue. States she will call and get an appointment, but hates to see doctors and hates taking medication. [name], BSN, MHA, OCN 05/30/2017 10:32 AM

## 2017-06-07 ENCOUNTER — Inpatient Hospital Stay: Payer: Managed Care, Other (non HMO)

## 2017-06-07 ENCOUNTER — Encounter: Payer: Self-pay | Admitting: Oncology

## 2017-06-07 ENCOUNTER — Inpatient Hospital Stay: Payer: Managed Care, Other (non HMO) | Attending: Oncology | Admitting: Oncology

## 2017-06-07 ENCOUNTER — Encounter: Payer: Self-pay | Admitting: *Deleted

## 2017-06-07 VITALS — BP 153/95 | HR 92 | Temp 97.6°F | Resp 18 | Wt 253.8 lb

## 2017-06-07 DIAGNOSIS — C50911 Malignant neoplasm of unspecified site of right female breast: Secondary | ICD-10-CM

## 2017-06-07 DIAGNOSIS — Z006 Encounter for examination for normal comparison and control in clinical research program: Secondary | ICD-10-CM | POA: Insufficient documentation

## 2017-06-07 DIAGNOSIS — Z170 Estrogen receptor positive status [ER+]: Principal | ICD-10-CM

## 2017-06-07 DIAGNOSIS — R5383 Other fatigue: Secondary | ICD-10-CM | POA: Insufficient documentation

## 2017-06-07 DIAGNOSIS — A630 Anogenital (venereal) warts: Secondary | ICD-10-CM | POA: Insufficient documentation

## 2017-06-07 DIAGNOSIS — C50211 Malignant neoplasm of upper-inner quadrant of right female breast: Secondary | ICD-10-CM | POA: Insufficient documentation

## 2017-06-07 DIAGNOSIS — Z90722 Acquired absence of ovaries, bilateral: Secondary | ICD-10-CM | POA: Insufficient documentation

## 2017-06-07 DIAGNOSIS — Z79811 Long term (current) use of aromatase inhibitors: Secondary | ICD-10-CM | POA: Insufficient documentation

## 2017-06-07 DIAGNOSIS — F17210 Nicotine dependence, cigarettes, uncomplicated: Secondary | ICD-10-CM | POA: Diagnosis not present

## 2017-06-07 DIAGNOSIS — Z90710 Acquired absence of both cervix and uterus: Secondary | ICD-10-CM | POA: Insufficient documentation

## 2017-06-07 LAB — CBC WITH DIFFERENTIAL/PLATELET
BASOS ABS: 0.1 10*3/uL (ref 0–0.1)
Basophils Relative: 1 %
Eosinophils Absolute: 0.2 10*3/uL (ref 0–0.7)
Eosinophils Relative: 2 %
HCT: 37.8 % (ref 35.0–47.0)
HEMOGLOBIN: 12.8 g/dL (ref 12.0–16.0)
LYMPHS ABS: 1.6 10*3/uL (ref 1.0–3.6)
LYMPHS PCT: 19 %
MCH: 30.8 pg (ref 26.0–34.0)
MCHC: 33.8 g/dL (ref 32.0–36.0)
MCV: 91.2 fL (ref 80.0–100.0)
Monocytes Absolute: 0.5 10*3/uL (ref 0.2–0.9)
Monocytes Relative: 6 %
NEUTROS ABS: 5.9 10*3/uL (ref 1.4–6.5)
NEUTROS PCT: 72 %
PLATELETS: 255 10*3/uL (ref 150–440)
RBC: 4.15 MIL/uL (ref 3.80–5.20)
RDW: 16.4 % — ABNORMAL HIGH (ref 11.5–14.5)
WBC: 8.3 10*3/uL (ref 3.6–11.0)

## 2017-06-07 LAB — COMPREHENSIVE METABOLIC PANEL
ALK PHOS: 75 U/L (ref 38–126)
ALT: 30 U/L (ref 14–54)
AST: 24 U/L (ref 15–41)
Albumin: 3.6 g/dL (ref 3.5–5.0)
Anion gap: 7 (ref 5–15)
BUN: 10 mg/dL (ref 6–20)
CALCIUM: 8.8 mg/dL — AB (ref 8.9–10.3)
CHLORIDE: 103 mmol/L (ref 101–111)
CO2: 30 mmol/L (ref 22–32)
Creatinine, Ser: 0.68 mg/dL (ref 0.44–1.00)
Glucose, Bld: 114 mg/dL — ABNORMAL HIGH (ref 65–99)
Potassium: 4.5 mmol/L (ref 3.5–5.1)
Sodium: 140 mmol/L (ref 135–145)
Total Bilirubin: 0.5 mg/dL (ref 0.3–1.2)
Total Protein: 6.8 g/dL (ref 6.5–8.1)

## 2017-06-07 NOTE — Progress Notes (Signed)
Here for follow up appt  

## 2017-06-07 NOTE — Progress Notes (Signed)
Paula [name] returns to clinic today for her Month 18 visit on the AFT-05 PALLAS study. Patient states she is doing a little better on the Arimidex than she did on Tamoxifen. Reports her joint pain has improved, but continues to experience foot pain - mostly while on her feet at work. Patient also reports pain in her right knee today. She works a job that requires her to walk on concrete floors all day, and states her fatigue has gotten worse.  Her weight is unchanged since last visit. Hot flashes and night sweats continue. She has some pitting edema in her BLE, including feet and ankles, which she relates to walking a lot at work on the concrete floors, but also discussed that it could be related to weight gain or diabetes. States she has been told she has borderline diabetes and she was encouraged to make an appointment with her PCP for evaluation of this and to have her thyoid level checked due to increased fatigue and weight gain. Patient states she will see Dr. Bobette Mo, her PCP for this on 06/20/17. Dr. Janese Banks spoke with her about the possibility of sleep apnea causing her increasing fatigue and patient admits she snores when sleeping and also falls asleep while watching TV. CBC and METC were within acceptable parameters today. HgbA1C has not resulted yet. B/P was initially elevated, but recheck value was 128/84. VS otherwise stable. Dr. Janese Banks has performed her H&P and has recommended that [name] get a Bone density test and be referred to the CARE program for exercise therapy as well as the [facility] for weight loss recommendation. Dr. Janese Banks also recommended that [name] continue the Arimidex for a while longer and we will call and check on her in about a month - or the patient can call us at any time if she has further problems. Patient denies any other new or changed medications since last visit. ECOG PS - 1. She will have her annual mammogram on 06/21/17. Patient returned the completed  medication diaries for cycles 15, 16 & 17 Arimidex and states she already has new medication diaries for cycles 18, 19 & 20. Cycle 18 Arimidex began on 06/02/17. Endocrine Therapy and PRO Questionnaires were completed by patient while in clinic today. She will return to see Dr. Rogue Bussing on 08/24/2017 for her 21 month study visit. Current AE's and attributions are listed below:   Adverse Event Log  Study/Protocol: AFT-05 PALLAS Cycle: 15-17 (reported at Month 18 visit) Event Grade Onset Date Resolved Date Drug Name Attribution Treatment Comments  Joint pain Grade 1 09/2015  Arimidex Possibly  Generally improved, Rt knee worse  Hot flashes Grade 1 09/2015  Arimidex Probably  Pt declined tx (Effexor)  Night sweats Grade 1 09/2015  Arimidex Probably  Pt declined tx (Effexor)  Muscle cramps Grade 1 02/21/16 06/07/17 Tamoxifen Possible Rx for high dose Vitamin D 09/29/16 suggested otc glucosamine & chondroitin Reports improved with Vit.D and with Glucosamine & Chondroitin  Weight gain Grade 2 02/04/16  Arimidex Unrelated  Weight up 30lbs (14%increase), referral made to dietician  Intermittant tingling in 4th & 5th fingers on left hand Grade 1 03/2016 06/07/17 Tamoxifen unrelated  Resolved  Edema in BLE Grade 1 Reported 06/30/16  Arimidex Unrelated  Swelling (pitting edema) noted in [place], BSN, [place], OCN 06/07/2017 11:27 AM

## 2017-06-07 NOTE — Progress Notes (Signed)
Recheck b/p 128/84

## 2017-06-07 NOTE — Progress Notes (Signed)
Hematology/Oncology Consult note [facility]  [phone] [phone]  Patient Care Team: Santayana, Titus Mould, DO as PCP - General (Family Medicine) Bary Castilla, Forest Gleason, MD (General Surgery) [place], Titus Mould, DO as Referring Physician (Family Medicine)   Name of the patient: Paula [name]  [id number]  03/09/1969   Date of visit: 06/07/17  Diagnosis- stage II breast cancer on PALLAS trial  Chief complaint/ Reason for visit- routine f/u  Heme/Onc history:  Oncology History   # 2016- BREAST CANCER  STAGE II T1 cN1 M0 [s/p Lumpec & RT]ER/PR- Pos; HER-2/neu receptor negative. Mammaprint- Low risk; no chemo; Oct 2016- START TAM; [place] [place] study/control arm.   # MARCH 2018- discon Tam [sec to weight gain]; start Arimidex  # Dec 2016-  TAH + BSO      Carcinoma of upper-inner quadrant of right breast in female, estrogen receptor positive ([place])     Interval history- does report significant fatigue since she started arimidex. States that she falls off to sleep while watching TV and sometimes finds it hard to go to work. Arthralgias well controlled  ECOG PS- 1 Pain scale- 5   Review of systems- Review of Systems  Constitutional: Positive for malaise/fatigue. Negative for chills, fever and weight loss.  HENT: Negative for congestion, ear discharge and nosebleeds.   Eyes: Negative for blurred vision.  Respiratory: Negative for cough, hemoptysis, sputum production, shortness of breath and wheezing.   Cardiovascular: Negative for chest pain, palpitations, orthopnea and claudication.  Gastrointestinal: Negative for abdominal pain, blood in stool, constipation, diarrhea, heartburn, melena, nausea and vomiting.  Genitourinary: Negative for dysuria, flank pain, frequency, hematuria and urgency.  Musculoskeletal: Positive for joint pain. Negative for back pain and myalgias.  Skin: Negative for rash.  Neurological: Negative for  dizziness, tingling, focal weakness, seizures, weakness and headaches.  Endo/Heme/Allergies: Does not bruise/bleed easily.  Psychiatric/Behavioral: Negative for depression and suicidal ideas. The patient does not have insomnia.       No Known Allergies   Past Medical History:  Diagnosis Date  . BRCA negative 2016  . Breast cancer ([place]) 05/28/15   pT1c,N1a:2/3 SLN positive for macrometastisi,ER positive, PR positive, HER-2/neu not overexpressed c Radiation  . Dog bite of left lower leg 03/07/15  . HPV in female 2016  . Pneumonia 02/2015  . STD (female)      Past Surgical History:  Procedure Laterality Date  . ABDOMINAL HYSTERECTOMY  12/01/2015   Procedure: total abdominal hysterectomty, bilateral salpingo- oopharectomy, cystoscopy ;  Surgeon: Benjaman Kindler, MD;  Location: [facility] ORS;  Service: Gynecology;;  . BREAST BIOPSY Right 6.7.16   Positive  . BREAST LUMPECTOMY WITH SENTINEL LYMPH NODE BIOPSY Right 07/17/2015   Procedure: RIGHT BREAST WIDE EXCISION WITH SENTINEL NODE BIOPSY;  Surgeon: Robert Bellow, MD;  Location: [facility] ORS;  Service: General;  Laterality: Right;  . BREAST SURGERY Right 05/28/15  . COLPOSCOPY  05/28/15   High grade squamous intraepithelial lesion  . LAPAROSCOPIC VAGINAL HYSTERECTOMY WITH SALPINGO OOPHORECTOMY Bilateral 12/01/2015   Procedure: LAPAROSCOPIC ASSISTED VAGINAL HYSTERECTOMY WITH SALPINGO OOPHORECTOMY-converted to;  Surgeon: Benjaman Kindler, MD;  Location: [facility] ORS;  Service: Gynecology;  Laterality: Bilateral;  . WISDOM TOOTH EXTRACTION      Social History   Social History  . Marital status: Single    Spouse name: N/A  . Number of children: N/A  . Years of education: N/A   Occupational History  . Not on file.   Social History Main Topics  .  Smoking status: Current Every Day Smoker    Packs/day: 1.00    Years: 36.00    Types: Cigarettes  . Smokeless tobacco: Never Used  . Alcohol use 0.0 oz/week     Comment: occ  . Drug use: No   . Sexual activity: Not on file   Other Topics Concern  . Not on file   Social History Narrative  . No narrative on file    Family History  Problem Relation Age of Onset  . Cancer Maternal Aunt 50       breast  . Breast cancer Maternal Aunt      Current Outpatient Prescriptions:  .  anastrozole (ARIMIDEX) 1 MG tablet, Take 1 tablet (1 mg total) by mouth daily., Disp: 30 tablet, Rfl: 3 .  ibuprofen (ADVIL,MOTRIN) 600 MG tablet, Take 1 tablet (600 mg total) by mouth every 6 (six) hours as needed (mild pain)., Disp: 30 tablet, Rfl: 0 .  Multiple Vitamins-Minerals (MULTIVITAMIN ADULT) CHEW, Chew 2 Doses by mouth daily., Disp: , Rfl:  .  naproxen sodium (ANAPROX) 220 MG tablet, Take 220 mg by mouth 2 (two) times daily with a meal., Disp: , Rfl:  .  tamoxifen (NOLVADEX) 20 MG tablet, TAKE 1 TABLET (20 MG TOTAL) BY MOUTH DAILY., Disp: 30 tablet, Rfl: 12 .  Vitamin D, Ergocalciferol, (DRISDOL) 50000 units CAPS capsule, TAKE 1 CAPSULE (50,000 UNITS TOTAL) BY MOUTH EVERY 7 (SEVEN) DAYS., Disp: 24 capsule, Rfl: 0  Physical exam:  Vitals:   06/07/17 1003  BP: (!) 153/95  Pulse: 92  Resp: 18  Temp: 97.6 F (36.4 C)  TempSrc: Tympanic  Weight: 253 lb 12.8 oz (115.1 kg)   Physical Exam  Constitutional: She is oriented to person, place, and time.  Obese, appears in no acute distress  HENT:  Head: Normocephalic and atraumatic.  Eyes: EOM are normal. Pupils are equal, round, and reactive to light.  Neck: Normal range of motion.  Cardiovascular: Normal rate, regular rhythm and normal heart sounds.   Pulmonary/Chest: Effort normal and breath sounds normal.  Abdominal: Soft. Bowel sounds are normal.  Musculoskeletal: She exhibits edema.  Neurological: She is alert and oriented to person, place, and time.  Skin: Skin is warm and dry.   breast exam not done today  CMP Latest Ref Rng & Units 06/07/2017  Glucose 65 - 99 mg/dL 114(H)  BUN 6 - 20 mg/dL 10  Creatinine 0.44 - 1.00 mg/dL 0.68   Sodium 135 - 145 mmol/L 140  Potassium 3.5 - 5.1 mmol/L 4.5  Chloride 101 - 111 mmol/L 103  CO2 22 - 32 mmol/L 30  Calcium 8.9 - 10.3 mg/dL 8.8(L)  Total Protein 6.5 - 8.1 g/dL 6.8  Total Bilirubin 0.3 - 1.2 mg/dL 0.5  Alkaline Phos 38 - 126 U/L 75  AST 15 - 41 U/L 24  ALT 14 - 54 U/L 30   CBC Latest Ref Rng & Units 06/07/2017  WBC 3.6 - 11.0 K/uL 8.3  Hemoglobin 12.0 - 16.0 g/dL 12.8  Hematocrit 35.0 - 47.0 % 37.8  Platelets 150 - 440 K/uL 255      Assessment and plan- Patient is a 51 y.o. female with a h/o right breast cancer Stage II ER/PR positive her 2 negative currently on arimidex  1. Continue arimidex. Encouraged her to focus on physical activity/ exercise and diet control to lose weight and counteract fatigue. She will call us in 1 month to see how she is doing. Will order baseline DEXA today. She  will also start taking calcium 1200 mg along with vit D 800 IU daily. rtc in 84 days to see Dr. Rogue Bussing with cbc, cmp per protocol for PALLAS. mammogram next month  2. Fatigue- likely secondary to obesity and possible OSA. She needs to discuss this with pcp further toevaluate her for OSA and/ or hypothyroidism      Visit Diagnosis 1. Carcinoma of right breast in female, estrogen receptor positive, unspecified site of breast ([place])      Dr. Randa Evens, MD, MPH [facility] Pager- [phone] 06/07/2017 11:23 AM

## 2017-06-07 NOTE — Addendum Note (Signed)
Addended by: Randa Evens C on: 06/07/2017 12:57 PM   Modules accepted: Orders

## 2017-06-08 ENCOUNTER — Other Ambulatory Visit: Payer: Self-pay | Admitting: *Deleted

## 2017-06-08 ENCOUNTER — Telehealth: Payer: Self-pay | Admitting: *Deleted

## 2017-06-08 DIAGNOSIS — C50211 Malignant neoplasm of upper-inner quadrant of right female breast: Secondary | ICD-10-CM

## 2017-06-08 DIAGNOSIS — Z170 Estrogen receptor positive status [ER+]: Principal | ICD-10-CM

## 2017-06-08 LAB — HEMOGLOBIN A1C
Hgb A1c MFr Bld: 6.9 % — ABNORMAL HIGH (ref 4.8–5.6)
Mean Plasma Glucose: 151 mg/dL

## 2017-06-08 MED ORDER — ANASTROZOLE 1 MG PO TABS: 1.0000 mg | ORAL_TABLET | Freq: Every day | ORAL | 3 refills | Status: DC

## 2017-06-08 NOTE — Telephone Encounter (Signed)
Called pt and left message that I did get a call back from care program which is the exercise program and she can come 1 day a week and it is on tues and Thursday from 12:15 to 1:30.  If pt does not get a call from the care program in 1 week then I asked her to call me back and left my number.

## 2017-06-14 ENCOUNTER — Telehealth: Payer: Self-pay | Admitting: *Deleted

## 2017-06-14 ENCOUNTER — Other Ambulatory Visit: Payer: Managed Care, Other (non HMO)

## 2017-06-14 NOTE — Telephone Encounter (Signed)
I left vm that Paula [name] may exercise in the CARE Cancer Exercise Class at [facility]. [phone]

## 2017-06-21 ENCOUNTER — Ambulatory Visit
Admission: RE | Admit: 2017-06-21 | Discharge: 2017-06-21 | Disposition: A | Discharge status: Home / Self Care | Payer: Managed Care, Other (non HMO) | Source: Ambulatory Visit | Attending: Internal Medicine | Admitting: Internal Medicine

## 2017-06-21 DIAGNOSIS — C50211 Malignant neoplasm of upper-inner quadrant of right female breast: Secondary | ICD-10-CM | POA: Diagnosis present

## 2017-06-21 DIAGNOSIS — Z170 Estrogen receptor positive status [ER+]: Secondary | ICD-10-CM | POA: Insufficient documentation

## 2017-06-21 HISTORY — DX: Personal history of irradiation: Z92.3

## 2017-06-30 ENCOUNTER — Inpatient Hospital Stay: Payer: Managed Care, Other (non HMO)

## 2017-07-05 ENCOUNTER — Inpatient Hospital Stay: Admission: RE | Admit: 2017-07-05 | Payer: Managed Care, Other (non HMO) | Source: Ambulatory Visit

## 2017-07-13 ENCOUNTER — Ambulatory Visit
Admission: RE | Admit: 2017-07-13 | Discharge: 2017-07-13 | Disposition: A | Discharge status: Home / Self Care | Payer: Managed Care, Other (non HMO) | Source: Ambulatory Visit | Attending: Oncology | Admitting: Oncology

## 2017-07-13 DIAGNOSIS — C50911 Malignant neoplasm of unspecified site of right female breast: Secondary | ICD-10-CM | POA: Insufficient documentation

## 2017-07-13 DIAGNOSIS — Z79899 Other long term (current) drug therapy: Secondary | ICD-10-CM | POA: Insufficient documentation

## 2017-07-13 DIAGNOSIS — M8588 Other specified disorders of bone density and structure, other site: Secondary | ICD-10-CM | POA: Diagnosis not present

## 2017-07-13 DIAGNOSIS — Z170 Estrogen receptor positive status [ER+]: Secondary | ICD-10-CM | POA: Diagnosis present

## 2017-07-13 DIAGNOSIS — M85852 Other specified disorders of bone density and structure, left thigh: Secondary | ICD-10-CM | POA: Insufficient documentation

## 2017-07-28 ENCOUNTER — Inpatient Hospital Stay: Payer: Managed Care, Other (non HMO) | Attending: Internal Medicine

## 2017-07-28 NOTE — Progress Notes (Signed)
Nutrition Follow-up:  MD referral for weight loss and healthy diet education  Met with patient on 1/8/208 to discuss healthy diet education and weight loss tips.  Made referral at that time to meet with [place] RD.  Patient reports that she went to see RD but I do not see a note regarding that.    Patient reports that she is borderline DM and has plans to meet with PCP again on 08/30/2017.    Patient reports that she rarely eats breakfast as she has to be at work so early.  Also skips meals as times due to schedule.  Wanting to know if pork skins and slim jims are ok for her to eat (they have less carbs).  Drinks regular soda although she has cut back amount recently.     Medications: arimidex, MVI, Vit D  Labs: reviewed  Anthropometrics:   Height: 64 inches Weight: 253 lb Goal weight 200 lb BMI: 44   NUTRITION DIAGNOSIS: Food and nutrition related knowledge deficit related to weight gain as evidenced by interested in weight loss tips and techniques   INTERVENTION:   Noted patient is able to attend CARE exercise program at least 1 time per week but has not done this yet due to work schedule.  Reports that she has a gym membership but does not use it. Patient reports that she wants to set a goal of exercising 30 minutes per day for 3 days per week.   Patient plans to replace sugary beverages with diet, no calorie beverages to help blood glucose and weight loss goals Patient will utilize [email or web address] website to help her reach her weight loss goals.  Showed patient website today during visit and tools to help her loose weight Strongly encouraged patient to ask PCP for referral to [facility] with concerns for elevated blood glucose. Discussed briefly carbohydrates (healthy) and encouraged patient to eat 45 g of carbs per meal.   Contact information given to patient  MONITORING, EVALUATION, GOAL: Weight trends, blood gluose   NEXT VISIT: as needed  Avett Reineck B. Zenia Resides,  [place], [place] Registered Dietitian [phone] (pager)

## 2017-08-24 ENCOUNTER — Telehealth: Payer: Self-pay | Admitting: Internal Medicine

## 2017-08-24 ENCOUNTER — Other Ambulatory Visit: Payer: Self-pay | Admitting: *Deleted

## 2017-08-24 ENCOUNTER — Inpatient Hospital Stay (HOSPITAL_BASED_OUTPATIENT_CLINIC_OR_DEPARTMENT_OTHER): Payer: Managed Care, Other (non HMO) | Admitting: Internal Medicine

## 2017-08-24 ENCOUNTER — Encounter: Payer: Self-pay | Admitting: *Deleted

## 2017-08-24 ENCOUNTER — Inpatient Hospital Stay: Payer: Managed Care, Other (non HMO) | Attending: Internal Medicine

## 2017-08-24 VITALS — BP 111/75 | HR 87 | Temp 97.5°F | Resp 22 | Ht 63.0 in | Wt 245.0 lb

## 2017-08-24 DIAGNOSIS — Z90722 Acquired absence of ovaries, bilateral: Secondary | ICD-10-CM | POA: Insufficient documentation

## 2017-08-24 DIAGNOSIS — M8580 Other specified disorders of bone density and structure, unspecified site: Secondary | ICD-10-CM | POA: Insufficient documentation

## 2017-08-24 DIAGNOSIS — Z170 Estrogen receptor positive status [ER+]: Principal | ICD-10-CM

## 2017-08-24 DIAGNOSIS — F17210 Nicotine dependence, cigarettes, uncomplicated: Secondary | ICD-10-CM

## 2017-08-24 DIAGNOSIS — C50211 Malignant neoplasm of upper-inner quadrant of right female breast: Secondary | ICD-10-CM | POA: Diagnosis not present

## 2017-08-24 DIAGNOSIS — A630 Anogenital (venereal) warts: Secondary | ICD-10-CM | POA: Diagnosis not present

## 2017-08-24 DIAGNOSIS — C50911 Malignant neoplasm of unspecified site of right female breast: Secondary | ICD-10-CM

## 2017-08-24 DIAGNOSIS — Z79811 Long term (current) use of aromatase inhibitors: Secondary | ICD-10-CM | POA: Insufficient documentation

## 2017-08-24 DIAGNOSIS — Z006 Encounter for examination for normal comparison and control in clinical research program: Secondary | ICD-10-CM

## 2017-08-24 DIAGNOSIS — Z90710 Acquired absence of both cervix and uterus: Secondary | ICD-10-CM | POA: Insufficient documentation

## 2017-08-24 LAB — COMPREHENSIVE METABOLIC PANEL
ALK PHOS: 78 U/L (ref 38–126)
ALT: 31 U/L (ref 14–54)
AST: 22 U/L (ref 15–41)
Albumin: 3.7 g/dL (ref 3.5–5.0)
Anion gap: 9 (ref 5–15)
BUN: 14 mg/dL (ref 6–20)
CALCIUM: 9.3 mg/dL (ref 8.9–10.3)
CHLORIDE: 102 mmol/L (ref 101–111)
CO2: 27 mmol/L (ref 22–32)
CREATININE: 0.7 mg/dL (ref 0.44–1.00)
GFR calc Af Amer: >60 mL/min (ref >60)
Glucose, Bld: 117 mg/dL — ABNORMAL HIGH (ref 65–99)
Potassium: 4.5 mmol/L (ref 3.5–5.1)
Sodium: 138 mmol/L (ref 135–145)
Total Bilirubin: 0.4 mg/dL (ref 0.3–1.2)
Total Protein: 7.5 g/dL (ref 6.5–8.1)

## 2017-08-24 LAB — CBC WITH DIFFERENTIAL/PLATELET
BASOS ABS: 0.1 10*3/uL (ref 0–0.1)
BASOS PCT: 1 %
EOS ABS: 0.2 10*3/uL (ref 0–0.7)
Eosinophils Relative: 2 %
HEMATOCRIT: 39.4 % (ref 35.0–47.0)
HEMOGLOBIN: 13.3 g/dL (ref 12.0–16.0)
Lymphocytes Relative: 22 %
Lymphs Abs: 1.9 10*3/uL (ref 1.0–3.6)
MCH: 30.3 pg (ref 26.0–34.0)
MCHC: 33.7 g/dL (ref 32.0–36.0)
MCV: 89.8 fL (ref 80.0–100.0)
Monocytes Absolute: 0.4 10*3/uL (ref 0.2–0.9)
Monocytes Relative: 5 %
NEUTROS ABS: 5.8 10*3/uL (ref 1.4–6.5)
NEUTROS PCT: 70 %
PLATELETS: 298 10*3/uL (ref 150–440)
RBC: 4.39 MIL/uL (ref 3.80–5.20)
RDW: 15.8 % — ABNORMAL HIGH (ref 11.5–14.5)
WBC: 8.4 10*3/uL (ref 3.6–11.0)

## 2017-08-24 NOTE — Telephone Encounter (Signed)
Follow up in 3 months/labs [order cbc/cmp/ca-27-29]- Dr.B

## 2017-08-24 NOTE — Progress Notes (Signed)
Paula [name] returns to clinic today for her Month 21 visit on the AFT-05 PALLAS study. Patient states she is still taking the Arimidex. Reports her joint pain is still improved, but continues to experience foot pain while on her feet at work. States she is going to see a podiatrist for this, but her job does require her to walk on concrete floors all day. Reports her fatigue has not improved. She has seen a dietician and made some changes in her diet and reports she lost 8 lbs since her last visit 3 months ago. Hot flashes and night sweats continue. She continues to have some edema in her BLE, but this has improved a good deal and is not pitting today. Patient curious about her blood glucose level, which is 117 mg/dL this morning. States she has not had anything to eat yet and only water to drink. Patient saw her PCP, Dr. Bobette Mo, on 06/20/17 and was told she has Type II diabetes and has a vitamin D deficiency. She has seen a nutritionist and is trying to control her diabetes with diet. States she has not yet started an exercise program. CBC and METC were within acceptable parameters today. B/P has improved significantly and VS  Are otherwise stable. Bone density test was completed on 07/13/17 and results showed she has some osteopenia. She is taking a Calcium + Vit D supplement. Patient denies any other new or changed medications since last visit. ECOG PS - 0 today. She had her annual mammogram on 06/21/17, with negative findings. Patient returned the completed medication diaries for cycles 18, 19 & 20 and states she already has new medication diaries for cycles 21, 22 & 23. She will return to see Dr. Rogue Bussing on 11/16/2017 for her 24 month study visit. Current AE's and attributions are listed below:   Adverse Event Log  Study/Protocol: AFT-05 PALLAS Cycle: 18-20 (reported at Month 21 visit) Event Grade Onset Date Resolved Date Drug Name Attribution Treatment Comments  Joint pain Grade 1 09/2015   Arimidex Possibly  Generally improved, Rt knee worse  Hot flashes Grade 1 09/2015  Arimidex Probably  Pt declined tx (Effexor)  Night sweats Grade 1 09/2015  Arimidex Probably  Pt declined tx (Effexor)  Muscle cramps Grade 1 02/21/16 06/07/17 Tamoxifen Possible Rx for high dose Vitamin D 09/29/16 suggested otc glucosamine & chondroitin Reports improved with Vit.D and with Glucosamine & Chondroitin  Weight gain Grade 1 02/04/16  Arimidex Unrelated  Weight down 8 lbs today (9.5% increase now)  Edema in BLE Grade 1 Reported 06/30/16  Arimidex Unrelated  Swelling in BLE has improved  [name], BSN, MHA, OCN 08/24/2017 12:52 PM

## 2017-08-24 NOTE — Telephone Encounter (Signed)
Co[place], please schedule

## 2017-08-24 NOTE — Assessment & Plan Note (Addendum)
#  Right Stage II Breast ca; ER/PR positive HER-2/neu negative  Currently on Anastrzole on adjuvant basis. Clinically no evidence of recurrence. Control arm of PALLAS study. Mammogram July 2018-N.   # # OSTEOPNIA- BMD July 2018- on ca+vit D. Discussed re: risk factor including smoking [see below]; antiestrogen therapy; postmenopausal status; Caucasian race. Discussed regarding the  exercise/especially weightbearing; also use of oral bisphosphonates like Fosamax/Boniva. Also discussed the use of Reclast/prolia SQ. patient wants to hold off any medication interventions at this time.  # Muscle cramps and leg cramps- improved.   # smoking cessation- discussed regarding smoking cessation. Patient is interested in [facility] smoking cessation program. Will refer to lung navigators.   # follow up in 3 months/labs. Clinical trials RN will check on pt todday.   Cc; Dr.Shawn/Hyaley;

## 2017-08-24 NOTE — Progress Notes (Signed)
[place] OFFICE PROGRESS NOTE  Patient Care Team: Santayana, Titus Mould, DO as PCP - General (Family Medicine) Bary Castilla, Forest Gleason, MD (General Surgery) [place], Titus Mould, DO as Referring Physician (Family Medicine)   SUMMARY OF ONCOLOGIC HISTORY:  Oncology History   # 2016- BREAST CANCER  STAGE II T1 cN1 M0 [s/p Lumpec & RT]ER/PR- Pos; HER-2/neu receptor negative. Mammaprint- Low risk; no chemo; Oct 2016- START TAM; [facility]ogene [place] study/control arm.   # MARCH 2018- discon Tam [sec to weight gain]; start Arimidex  # Dec 2016-  TAH + BSO   # July 2018- BMD OSTEOPENIA- ca+vit D     Carcinoma of upper-inner quadrant of right breast in female, estrogen receptor positive ([place])       INTERVAL HISTORY:  51 year old female patient with above history of stage II breast cancer currently on arimidex is here for follow-up. She is also on the control arm of the [place] study.   Patient has had intentional weight loss. SWITCHING from tamoxifen to Arimidex seems to help to weight gain. Joint pains muscle pain that improved.  Otherwise denies any lumps or bumps. Denies any mood swings.  Appetite is good. No cough shortness of breath or chest pain.    REVIEW OF SYSTEMS:  A complete 10 point review of system is done which is negative except mentioned above/history of present illness.   PAST MEDICAL HISTORY :  Past Medical History:  Diagnosis Date  . BRCA negative 2016  . Breast cancer ([place]) 05/28/15   pT1c,N1a:2/3 SLN positive for macrometastisi,ER positive, PR positive, HER-2/neu not overexpressed c Radiation  . Dog bite of left lower leg 03/07/15  . HPV in female 2016  . Personal history of radiation therapy 2016   BREAST CA  . Pneumonia 02/2015  . STD (female)     PAST SURGICAL HISTORY :   Past Surgical History:  Procedure Laterality Date  . ABDOMINAL HYSTERECTOMY  12/01/2015   Procedure: total abdominal hysterectomty, bilateral salpingo- oopharectomy,  cystoscopy ;  Surgeon: Benjaman Kindler, MD;  Location: [facility] ORS;  Service: Gynecology;;  . BREAST BIOPSY Right 6.7.16   Positive  . BREAST LUMPECTOMY Right 2016   BREAST CA  . BREAST LUMPECTOMY WITH SENTINEL LYMPH NODE BIOPSY Right 07/17/2015   Procedure: RIGHT BREAST WIDE EXCISION WITH SENTINEL NODE BIOPSY;  Surgeon: Robert Bellow, MD;  Location: [facility] ORS;  Service: General;  Laterality: Right;  . BREAST SURGERY Right 05/28/15  . COLPOSCOPY  05/28/15   High grade squamous intraepithelial lesion  . LAPAROSCOPIC VAGINAL HYSTERECTOMY WITH SALPINGO OOPHORECTOMY Bilateral 12/01/2015   Procedure: LAPAROSCOPIC ASSISTED VAGINAL HYSTERECTOMY WITH SALPINGO OOPHORECTOMY-converted to;  Surgeon: Benjaman Kindler, MD;  Location: [facility] ORS;  Service: Gynecology;  Laterality: Bilateral;  . WISDOM TOOTH EXTRACTION      FAMILY HISTORY :   Family History  Problem Relation Age of Onset  . Cancer Maternal Aunt 50       breast  . Breast cancer Maternal Aunt     SOCIAL HISTORY:   Social History  Substance Use Topics  . Smoking status: Current Every Day Smoker    Packs/day: 1.00    Years: 36.00    Types: Cigarettes  . Smokeless tobacco: Never Used  . Alcohol use 0.0 oz/week     Comment: occ    ALLERGIES:  has No Known Allergies.  MEDICATIONS:  Current Outpatient Prescriptions  Medication Sig Dispense Refill  . anastrozole (ARIMIDEX) 1 MG tablet Take 1 tablet (1 mg total) by mouth daily.  30 tablet 3  . Calcium-Vitamin D-Vitamin K (VIACTIV PO) Take 1 tablet by mouth daily.    . Cholecalciferol (VITAMIN D3) 2000 units TABS Take 1 tablet by mouth daily.    Marland Kitchen ibuprofen (ADVIL,MOTRIN) 600 MG tablet Take 1 tablet (600 mg total) by mouth every 6 (six) hours as needed (mild pain). 30 tablet 0  . Multiple Vitamins-Minerals (MULTIVITAMIN ADULT) CHEW Chew 2 Doses by mouth daily.    . naproxen sodium (ANAPROX) 220 MG tablet Take 220 mg by mouth 2 (two) times daily with a meal.    . Vitamin D,  Ergocalciferol, (DRISDOL) 50000 units CAPS capsule TAKE 1 CAPSULE (50,000 UNITS TOTAL) BY MOUTH EVERY 7 (SEVEN) DAYS. 24 capsule 0   No current facility-administered medications for this visit.     PHYSICAL EXAMINATION: ECOG PERFORMANCE STATUS: 0 - Asymptomatic  BP 111/75 (Patient Position: Sitting)   Pulse 87   Temp (!) 97.5 F (36.4 C) (Tympanic)   Resp (!) 22   Ht _0  (1.6 m)   Wt 245 lb (111.1 kg)   LMP 09/23/2015   BMI 43.40 kg/m   Filed Weights   08/24/17 1112  Weight: 245 lb (111.1 kg)    GENERAL: Well-nourished well-developed; Alert, no distress and comfortable.   Alone. Obese.  EYES: no pallor or icterus OROPHARYNX: no thrush or ulceration; good dentition  NECK: supple, no masses felt LYMPH:  no palpable lymphadenopathy in the cervical, axillary or inguinal regions LUNGS: clear to auscultation and  No wheeze or crackles HEART/CVS: regular rate & rhythm and no murmurs; 1+ lower extremity edema right side ABDOMEN:abdomen soft, non-tender and normal bowel sounds Musculoskeletal:no cyanosis of digits and no clubbing  PSYCH: alert & oriented x 3 with fluent speech NEURO: no focal motor/sensory deficits SKIN:  no rashes or significant lesions   LABORATORY DATA:  I have reviewed the data as listed    Component Value Date/Time   NA 138 08/24/2017 1044   K 4.5 08/24/2017 1044   CL 102 08/24/2017 1044   CO2 27 08/24/2017 1044   GLUCOSE 117 (H) 08/24/2017 1044   BUN 14 08/24/2017 1044   CREATININE 0.70 08/24/2017 1044   CALCIUM 9.3 08/24/2017 1044   PROT 7.5 08/24/2017 1044   ALBUMIN 3.7 08/24/2017 1044   AST 22 08/24/2017 1044   ALT 31 08/24/2017 1044   ALKPHOS 78 08/24/2017 1044   BILITOT 0.4 08/24/2017 1044   GFRNONAA >60 08/24/2017 1044   GFRAA >60 08/24/2017 1044    No results found for: SPEP, UPEP  Lab Results  Component Value Date   WBC 8.4 08/24/2017   NEUTROABS 5.8 08/24/2017   HGB 13.3 08/24/2017   HCT 39.4 08/24/2017   MCV 89.8 08/24/2017    PLT 298 08/24/2017      Chemistry      Component Value Date/Time   NA 138 08/24/2017 1044   K 4.5 08/24/2017 1044   CL 102 08/24/2017 1044   CO2 27 08/24/2017 1044   BUN 14 08/24/2017 1044   CREATININE 0.70 08/24/2017 1044      Component Value Date/Time   CALCIUM 9.3 08/24/2017 1044   ALKPHOS 78 08/24/2017 1044   AST 22 08/24/2017 1044   ALT 31 08/24/2017 1044   BILITOT 0.4 08/24/2017 1044         ASSESSMENT & PLAN:   Carcinoma of upper-inner quadrant of right breast in female, estrogen receptor positive ([place]) # Right Stage II Breast ca; ER/PR positive HER-2/neu negative  Currently on  Anastrzole on adjuvant basis. Clinically no evidence of recurrence. Control arm of PALLAS study. Mammogram July 2018-N.   # # OSTEOPNIA- BMD July 2018- on ca+vit D. Discussed re: risk factor including smoking [see below]; antiestrogen therapy; postmenopausal status; Caucasian race. Discussed regarding the  exercise/especially weightbearing; also use of oral bisphosphonates like Fosamax/Boniva. Also discussed the use of Reclast/prolia SQ. patient wants to hold off any medication interventions at this time.  # Muscle cramps and leg cramps- improved.   # smoking cessation- discussed regarding smoking cessation. Patient is interested in [facility] smoking cessation program. Will refer to lung navigators.   # follow up in 3 months/labs. Clinical trials RN will check on pt todday.   Cc; Dr.Shawn/Hyaley;      Cammie Sickle, MD 08/24/2017 1:40 PM

## 2017-09-10 ENCOUNTER — Other Ambulatory Visit: Payer: Self-pay | Admitting: Internal Medicine

## 2017-09-10 DIAGNOSIS — C50211 Malignant neoplasm of upper-inner quadrant of right female breast: Secondary | ICD-10-CM

## 2017-09-26 ENCOUNTER — Encounter: Payer: Self-pay | Admitting: *Deleted

## 2017-11-14 ENCOUNTER — Other Ambulatory Visit: Payer: Self-pay | Admitting: *Deleted

## 2017-11-14 DIAGNOSIS — C50211 Malignant neoplasm of upper-inner quadrant of right female breast: Secondary | ICD-10-CM

## 2017-11-14 DIAGNOSIS — Z170 Estrogen receptor positive status [ER+]: Principal | ICD-10-CM

## 2017-11-14 MED ORDER — ANASTROZOLE 1 MG PO TABS: 1.0000 mg | ORAL_TABLET | Freq: Every day | ORAL | 0 refills | Status: DC

## 2017-11-16 ENCOUNTER — Encounter: Payer: Self-pay | Admitting: *Deleted

## 2017-11-16 ENCOUNTER — Encounter: Payer: Self-pay | Admitting: Internal Medicine

## 2017-11-16 ENCOUNTER — Inpatient Hospital Stay: Payer: Managed Care, Other (non HMO) | Attending: Internal Medicine | Admitting: Internal Medicine

## 2017-11-16 ENCOUNTER — Other Ambulatory Visit: Payer: Self-pay

## 2017-11-16 ENCOUNTER — Inpatient Hospital Stay: Payer: Managed Care, Other (non HMO)

## 2017-11-16 VITALS — BP 141/93 | HR 90 | Temp 97.7°F | Resp 22 | Ht 63.0 in | Wt 249.5 lb

## 2017-11-16 DIAGNOSIS — Z90710 Acquired absence of both cervix and uterus: Secondary | ICD-10-CM | POA: Insufficient documentation

## 2017-11-16 DIAGNOSIS — A630 Anogenital (venereal) warts: Secondary | ICD-10-CM | POA: Diagnosis not present

## 2017-11-16 DIAGNOSIS — F17210 Nicotine dependence, cigarettes, uncomplicated: Secondary | ICD-10-CM | POA: Insufficient documentation

## 2017-11-16 DIAGNOSIS — Z170 Estrogen receptor positive status [ER+]: Principal | ICD-10-CM

## 2017-11-16 DIAGNOSIS — G4733 Obstructive sleep apnea (adult) (pediatric): Secondary | ICD-10-CM | POA: Insufficient documentation

## 2017-11-16 DIAGNOSIS — Z006 Encounter for examination for normal comparison and control in clinical research program: Secondary | ICD-10-CM | POA: Diagnosis not present

## 2017-11-16 DIAGNOSIS — C50211 Malignant neoplasm of upper-inner quadrant of right female breast: Secondary | ICD-10-CM

## 2017-11-16 DIAGNOSIS — Z79899 Other long term (current) drug therapy: Secondary | ICD-10-CM | POA: Insufficient documentation

## 2017-11-16 DIAGNOSIS — Z90722 Acquired absence of ovaries, bilateral: Secondary | ICD-10-CM | POA: Insufficient documentation

## 2017-11-16 DIAGNOSIS — Z79811 Long term (current) use of aromatase inhibitors: Secondary | ICD-10-CM | POA: Insufficient documentation

## 2017-11-16 DIAGNOSIS — M8580 Other specified disorders of bone density and structure, unspecified site: Secondary | ICD-10-CM | POA: Insufficient documentation

## 2017-11-16 LAB — COMPREHENSIVE METABOLIC PANEL
ALK PHOS: 93 U/L (ref 38–126)
ALT: 34 U/L (ref 14–54)
AST: 24 U/L (ref 15–41)
Albumin: 3.9 g/dL (ref 3.5–5.0)
Anion gap: 10 (ref 5–15)
BUN: 11 mg/dL (ref 6–20)
CALCIUM: 9.3 mg/dL (ref 8.9–10.3)
CO2: 27 mmol/L (ref 22–32)
CREATININE: 0.64 mg/dL (ref 0.44–1.00)
Chloride: 100 mmol/L — ABNORMAL LOW (ref 101–111)
GFR calc Af Amer: >60 mL/min (ref >60)
Glucose, Bld: 131 mg/dL — ABNORMAL HIGH (ref 65–99)
Potassium: 4.3 mmol/L (ref 3.5–5.1)
SODIUM: 137 mmol/L (ref 135–145)
Total Bilirubin: 0.6 mg/dL (ref 0.3–1.2)
Total Protein: 7.6 g/dL (ref 6.5–8.1)

## 2017-11-16 LAB — CBC WITH DIFFERENTIAL/PLATELET
BASOS PCT: 1 %
Basophils Absolute: 0.1 10*3/uL (ref 0–0.1)
EOS ABS: 0.1 10*3/uL (ref 0–0.7)
EOS PCT: 1 %
HCT: 39.5 % (ref 35.0–47.0)
Hemoglobin: 13 g/dL (ref 12.0–16.0)
Lymphocytes Relative: 18 %
Lymphs Abs: 1.7 10*3/uL (ref 1.0–3.6)
MCH: 29.1 pg (ref 26.0–34.0)
MCHC: 32.9 g/dL (ref 32.0–36.0)
MCV: 88.6 fL (ref 80.0–100.0)
MONOS PCT: 4 %
Monocytes Absolute: 0.4 10*3/uL (ref 0.2–0.9)
Neutro Abs: 7.3 10*3/uL — ABNORMAL HIGH (ref 1.4–6.5)
Neutrophils Relative %: 76 %
Platelets: 284 10*3/uL (ref 150–440)
RBC: 4.46 MIL/uL (ref 3.80–5.20)
RDW: 16.6 % — ABNORMAL HIGH (ref 11.5–14.5)
WBC: 9.6 10*3/uL (ref 3.6–11.0)

## 2017-11-16 MED ORDER — ANASTROZOLE 1 MG PO TABS: 1.0000 mg | ORAL_TABLET | Freq: Every day | ORAL | 3 refills | Status: DC

## 2017-11-16 NOTE — Progress Notes (Signed)
[place] OFFICE PROGRESS NOTE  [name] Care Team: Santayana, Titus Mould, DO as PCP - General (Family Medicine) Bary Castilla, Forest Gleason, MD (General Surgery) [place], Titus Mould, DO as Referring Physician (Family Medicine)   SUMMARY OF ONCOLOGIC HISTORY:  Oncology History   # 2016- BREAST CANCER  STAGE II T1 cN1 M0 [s/p Lumpec & RT]ER/PR- Pos; HER-2/neu receptor negative. Mammaprint- Low risk; no chemo; Oct 2016- START TAM; [facility]ogene [place] study/control arm.   # MARCH 2018- discon Tam [sec to weight gain]; start Arimidex  # Dec 2016-  TAH + BSO   # July 2018- BMD OSTEOPENIA- ca+vit D     Carcinoma of upper-inner quadrant of right breast in female, estrogen receptor positive ([place])       INTERVAL HISTORY:  51 year old female [name] with above history of stage II breast cancer currently on arimidex is here for follow-up. She is also on the control arm of the [place] study.   [name] has been diagnosed with obstructive sleep apnea; she is awaiting CPAP machine.  Otherwise denies any lumps or bumps. Denies any mood swings.  Appetite is good. No cough shortness of breath or chest pain.    REVIEW OF SYSTEMS:  A complete 10 point review of system is done which is negative except mentioned above/history of present illness.   PAST MEDICAL HISTORY :  Past Medical History:  Diagnosis Date  . BRCA negative 2016  . Breast cancer ([place]) 05/28/15   pT1c,N1a:2/3 SLN positive for macrometastisi,ER positive, PR positive, HER-2/neu not overexpressed c Radiation  . Dog bite of left lower leg 03/07/15  . HPV in female 2016  . Personal history of radiation therapy 2016   BREAST CA  . Pneumonia 02/2015  . Sleep apnea   . STD (female)     PAST SURGICAL HISTORY :   Past Surgical History:  Procedure Laterality Date  . ABDOMINAL HYSTERECTOMY  12/01/2015   Procedure: total abdominal hysterectomty, bilateral salpingo- oopharectomy, cystoscopy ;  Surgeon: Benjaman Kindler, MD;   Location: [facility] ORS;  Service: Gynecology;;  . BREAST BIOPSY Right 6.7.16   Positive  . BREAST LUMPECTOMY Right 2016   BREAST CA  . BREAST LUMPECTOMY WITH SENTINEL LYMPH NODE BIOPSY Right 07/17/2015   Procedure: RIGHT BREAST WIDE EXCISION WITH SENTINEL NODE BIOPSY;  Surgeon: Robert Bellow, MD;  Location: [facility] ORS;  Service: General;  Laterality: Right;  . BREAST SURGERY Right 05/28/15  . COLPOSCOPY  05/28/15   High grade squamous intraepithelial lesion  . LAPAROSCOPIC VAGINAL HYSTERECTOMY WITH SALPINGO OOPHORECTOMY Bilateral 12/01/2015   Procedure: LAPAROSCOPIC ASSISTED VAGINAL HYSTERECTOMY WITH SALPINGO OOPHORECTOMY-converted to;  Surgeon: Benjaman Kindler, MD;  Location: [facility] ORS;  Service: Gynecology;  Laterality: Bilateral;  . WISDOM TOOTH EXTRACTION      FAMILY HISTORY :   Family History  Problem Relation Age of Onset  . Cancer Maternal Aunt 50       breast  . Breast cancer Maternal Aunt     SOCIAL HISTORY:   Social History   Tobacco Use  . Smoking status: Current Every Day Smoker    Packs/day: 1.00    Years: 36.00    Pack years: 36.00    Types: Cigarettes  . Smokeless tobacco: Never Used  Substance Use Topics  . Alcohol use: Yes    Alcohol/week: 0.0 oz    Comment: occ  . Drug use: No    ALLERGIES:  has No Known Allergies.  MEDICATIONS:  Current Outpatient Medications  Medication Sig Dispense Refill  .  anastrozole (ARIMIDEX) 1 MG tablet Take 1 tablet (1 mg total) by mouth daily. 90 tablet 3  . Calcium-Vitamin D-Vitamin K (VIACTIV PO) Take 1 tablet by mouth daily.    . Cholecalciferol (VITAMIN D3) 2000 units TABS Take 1 tablet by mouth daily.    Marland Kitchen ibuprofen (ADVIL,MOTRIN) 600 MG tablet Take 1 tablet (600 mg total) by mouth every 6 (six) hours as needed (mild pain). 30 tablet 0  . Multiple Vitamins-Minerals (MULTIVITAMIN ADULT) CHEW Chew 2 Doses by mouth daily.    . naproxen sodium (ANAPROX) 220 MG tablet Take 220 mg by mouth 2 (two) times daily with a meal.     . Vitamin D, Ergocalciferol, (DRISDOL) 50000 units CAPS capsule TAKE 1 CAPSULE (50,000 UNITS TOTAL) BY MOUTH EVERY 7 (SEVEN) DAYS. 24 capsule 0   No current facility-administered medications for this visit.     PHYSICAL EXAMINATION: ECOG PERFORMANCE STATUS: 0 - Asymptomatic  BP (!) 141/93 (BP Location: Left Arm, [name] Position: Sitting)   Pulse 90   Temp 97.7 F (36.5 C) (Oral)   Resp (!) 22   Ht '5\' 3"'$  (1.6 m)   Wt 249 lb 8 oz (113.2 kg)   LMP 09/23/2015   BMI 44.20 kg/m   Filed Weights   11/16/17 1124  Weight: 249 lb 8 oz (113.2 kg)    GENERAL: Well-nourished well-developed; Alert, no distress and comfortable.   Alone. Obese.  EYES: no pallor or icterus OROPHARYNX: no thrush or ulceration; good dentition  NECK: supple, no masses felt LYMPH:  no palpable lymphadenopathy in the cervical, axillary or inguinal regions LUNGS: clear to auscultation and  No wheeze or crackles HEART/CVS: regular rate & rhythm and no murmurs; 1+ lower extremity edema right side ABDOMEN:abdomen soft, non-tender and normal bowel sounds Musculoskeletal:no cyanosis of digits and no clubbing  PSYCH: alert & oriented x 3 with fluent speech NEURO: no focal motor/sensory deficits SKIN:  no rashes or significant lesions except 5 mm hyperpigmented lesion noted under the left breast. Right and left BREAST exam [in the presence of nurse]- no unusual skin changes or dominant masses felt. Surgical scars noted.     LABORATORY DATA:  I have reviewed the data as listed    Component Value Date/Time   NA 137 11/16/2017 1103   K 4.3 11/16/2017 1103   CL 100 (L) 11/16/2017 1103   CO2 27 11/16/2017 1103   GLUCOSE 131 (H) 11/16/2017 1103   BUN 11 11/16/2017 1103   CREATININE 0.64 11/16/2017 1103   CALCIUM 9.3 11/16/2017 1103   PROT 7.6 11/16/2017 1103   ALBUMIN 3.9 11/16/2017 1103   AST 24 11/16/2017 1103   ALT 34 11/16/2017 1103   ALKPHOS 93 11/16/2017 1103   BILITOT 0.6 11/16/2017 1103   GFRNONAA >60  11/16/2017 1103   GFRAA >60 11/16/2017 1103    No results found for: SPEP, UPEP  Lab Results  Component Value Date   WBC 9.6 11/16/2017   NEUTROABS 7.3 (H) 11/16/2017   HGB 13.0 11/16/2017   HCT 39.5 11/16/2017   MCV 88.6 11/16/2017   PLT 284 11/16/2017      Chemistry      Component Value Date/Time   NA 137 11/16/2017 1103   K 4.3 11/16/2017 1103   CL 100 (L) 11/16/2017 1103   CO2 27 11/16/2017 1103   BUN 11 11/16/2017 1103   CREATININE 0.64 11/16/2017 1103      Component Value Date/Time   CALCIUM 9.3 11/16/2017 1103   ALKPHOS 93 11/16/2017  1103   AST 24 11/16/2017 1103   ALT 34 11/16/2017 1103   BILITOT 0.6 11/16/2017 1103         ASSESSMENT & PLAN:   Carcinoma of upper-inner quadrant of right breast in female, estrogen receptor positive ([place]) # Right Stage II Breast ca; ER/PR positive HER-2/neu negative  Currently on Anastrzole on adjuvant basis. Clinically no evidence of recurrence. Control arm of [facility] study. Mammogram July 2018-N.   # # OSTEOPNIA- BMD July 2018- on ca+vit D.  Discussed regarding multiple options  # 18m mole inframammary ; monitor.  # smoking cessation- discussed regarding smoking cessation; will not intetersted.    # Sleep Apnea/ awaiting CPAP.   # follow up in 1st week of March 2019.      GCammie Sickle MD 11/17/2017 6:57 PM

## 2017-11-16 NOTE — Progress Notes (Signed)
Paula [name] returns to clinic today for her Month 24 visit on the AFT-05 PALLAS study. Patient states she is still taking the Arimidex. Reports her joint pain has worsened and she has experienced increased foot pain at times, usually while on her feet at work. Reports her fatigue has not improved at all and she was recently diagnosed with "severe sleep apnea" but has not started using CPAP yet. Her appointment for the 2nd portion of the sleep study is more than a month away. She has seen her primary care physician and states she will go back next week. Reports her MD wants to put her on a special diet for her diabetes and has even talked about prescribing cholesterol medication for her. Patient reports she lost 5 lbs; however weight is up 4 lbs since last visit - but she had lost some weight on the previous visit. Hot flashes and night sweats continue. She continues to have some edema in her BLE. Blood glucose level is 131mg /dL this morning and Paula [name] states she has not had anything to eat yet today.  B/P is a little elevated today and respirations are slightly elevated as well. Continues taking a Calcium + Vit D supplement or osteopenia. Dr. Rogue Bussing in to examine patient. During breast exam, he noted a 9mm dark mole underneath patient's left lateral breast. Patient denies any other new or changed medications since last visit. ECOG PS - 0 today. Patient completed both her Endocrine Therapy and PRO Questionnaires and returned her completed medication diaries for cycles 21, 22 & 23. She verified that she has new medication diaries for cycles 24, 25 & 26. She will return to see Dr. Rogue Bussing in mid-March of 2019 for visit 13 and the end of active treatment. Current AE's and attributions are listed below:  Adverse Event Log  Study/Protocol: AFT-05 PALLAS Cycle: 21-23 (reported at Month 24 visit) Event Grade Onset Date Resolved Date Drug Name Attribution Treatment Comments   Joint pain Grade 1 09/2015   Arimidex Possibly  Generally improved, Rt knee worse   Hot flashes Grade 1 09/2015  Arimidex Probably  Pt declined tx (Effexor)   Night sweats Grade 1 09/2015  Arimidex Probably  Pt declined tx (Effexor)   Weight gain Grade 1 02/04/16  Arimidex Unrelated  Weight 4 lbs today (>7.6% increase now)   Edema in BLE Grade 1 Reported 06/30/16  Arimidex Unrelated  Swelling in BLE has improved   Elevated glucose Grade 1 11/16/17  Arimidex Unrelated  New dx of Diabetes Diet controlled at present  [name], BSN, MHA, OCN 11/16/2017 12:10 PM

## 2017-11-16 NOTE — Assessment & Plan Note (Addendum)
#  Right Stage II Breast ca; ER/PR positive HER-2/neu negative  Currently on Anastrzole on adjuvant basis. Clinically no evidence of recurrence. Control arm of PALLAS study. Mammogram July 2018-N.   # # OSTEOPNIA- BMD July 2018- on ca+vit D.  Discussed regarding multiple options  # 1m mole inframammary ; monitor.  # smoking cessation- discussed regarding smoking cessation; will not intetersted.    # Sleep Apnea/ awaiting CPAP.   # follow up in 1st week of March 2019.

## 2017-11-17 LAB — CANCER ANTIGEN 27.29: CA 27.29: 13.7 U/mL (ref 0.0–38.6)

## 2017-11-29 ENCOUNTER — Other Ambulatory Visit: Payer: Self-pay | Admitting: *Deleted

## 2017-11-29 DIAGNOSIS — C50211 Malignant neoplasm of upper-inner quadrant of right female breast: Secondary | ICD-10-CM

## 2017-11-29 MED ORDER — VITAMIN D (ERGOCALCIFEROL) 1.25 MG (50000 UNIT) PO CAPS: 50000.0000 [IU] | ORAL_CAPSULE | ORAL | 0 refills | Status: DC

## 2017-12-19 ENCOUNTER — Ambulatory Visit: Payer: Managed Care, Other (non HMO) | Admitting: Radiation Oncology

## 2017-12-23 ENCOUNTER — Other Ambulatory Visit: Payer: Self-pay

## 2017-12-23 ENCOUNTER — Encounter: Payer: Self-pay | Admitting: Radiation Oncology

## 2017-12-23 ENCOUNTER — Ambulatory Visit
Admission: RE | Admit: 2017-12-23 | Discharge: 2017-12-23 | Disposition: A | Discharge status: Home / Self Care | Payer: Managed Care, Other (non HMO) | Source: Ambulatory Visit | Attending: Radiation Oncology | Admitting: Radiation Oncology

## 2017-12-23 VITALS — BP 133/78 | HR 95 | Temp 98.2°F | Resp 20 | Wt 249.8 lb

## 2017-12-23 DIAGNOSIS — Z170 Estrogen receptor positive status [ER+]: Secondary | ICD-10-CM | POA: Insufficient documentation

## 2017-12-23 DIAGNOSIS — Z923 Personal history of irradiation: Secondary | ICD-10-CM | POA: Insufficient documentation

## 2017-12-23 DIAGNOSIS — C50211 Malignant neoplasm of upper-inner quadrant of right female breast: Secondary | ICD-10-CM | POA: Diagnosis not present

## 2017-12-23 DIAGNOSIS — Z79811 Long term (current) use of aromatase inhibitors: Secondary | ICD-10-CM | POA: Diagnosis not present

## 2017-12-23 NOTE — Progress Notes (Signed)
.  Radiation Oncology Follow up Note  Name: Paula [name]   Date:   12/23/2017 MRN:  [id number] DOB: 1966/04/09    This 52 y.o. female presents to the clinic today for a 2 year follow-up status post whole breast radiation to her right breast for stage II ER/PR positive invasive mammary carcinoma.  REFERRING PROVIDER: Garald [name]*  HPI: Patient is a 52 year old female now out 2 years having completed radiation therapy to her right breast for stage II (T1 CN I M0) invasive mammary carcinoma status post wide local excision. She is seen today in routine follow-up is doing well.. Her last mammogram was back in July and was BI-RADS 2 benign which I have reviewed. She is currently on arimadex tolerating that well without side effect.  COMPLICATIONS OF TREATMENT: none  FOLLOW UP COMPLIANCE: keeps appointments   PHYSICAL EXAM:  BP 133/78   Pulse 95   Temp 98.2 F (36.8 C)   Resp 20   Wt 249 lb 12.5 oz (113.3 kg)   LMP 09/23/2015   BMI 44.25 kg/m  Right breast is deformed secondary to surgery. Cosmetic result is fair. No dominant mass or nodularity is noted in either breast in 2 positions examined. No axillary or supraclavicular adenopathy is identified. Well-developed well-nourished patient in NAD. HEENT reveals PERLA, EOMI, discs not visualized.  Oral cavity is clear. No oral mucosal lesions are identified. Neck is clear without evidence of cervical or supraclavicular adenopathy. Lungs are clear to A&P. Cardiac examination is essentially unremarkable with regular rate and rhythm without murmur rub or thrill. Abdomen is benign with no organomegaly or masses noted. Motor sensory and DTR levels are equal and symmetric in the upper and lower extremities. Cranial nerves II through XII are grossly intact. Proprioception is intact. No peripheral adenopathy or edema is identified. No motor or sensory levels are noted. Crude visual fields are within normal range.  RADIOLOGY RESULTS: Mammograms  are reviewed and compatible with the above-stated findings  PLAN: Present time patient is doing well with no evidence of disease. I've asked to see her back in 1 year for follow-up. She's ordered and scheduled for follow-up mammograms. She continues on arimadex without side effect. Patient knows to call with any concerns.  I would like to take this opportunity to thank you for allowing me to participate in the care of your patient.Paula Filbert, MD

## 2018-03-08 ENCOUNTER — Other Ambulatory Visit: Payer: Managed Care, Other (non HMO)

## 2018-03-08 ENCOUNTER — Ambulatory Visit: Payer: Managed Care, Other (non HMO) | Admitting: Internal Medicine

## 2018-03-17 ENCOUNTER — Encounter: Payer: Self-pay | Admitting: *Deleted

## 2018-03-17 ENCOUNTER — Encounter: Payer: Self-pay | Admitting: Internal Medicine

## 2018-03-17 ENCOUNTER — Inpatient Hospital Stay: Payer: Managed Care, Other (non HMO)

## 2018-03-17 ENCOUNTER — Inpatient Hospital Stay: Payer: Managed Care, Other (non HMO) | Attending: Internal Medicine | Admitting: Internal Medicine

## 2018-03-17 VITALS — BP 130/93 | HR 59 | Temp 98.4°F | Resp 16 | Wt 236.4 lb

## 2018-03-17 DIAGNOSIS — D229 Melanocytic nevi, unspecified: Secondary | ICD-10-CM | POA: Insufficient documentation

## 2018-03-17 DIAGNOSIS — M8580 Other specified disorders of bone density and structure, unspecified site: Secondary | ICD-10-CM

## 2018-03-17 DIAGNOSIS — Z170 Estrogen receptor positive status [ER+]: Secondary | ICD-10-CM

## 2018-03-17 DIAGNOSIS — G4733 Obstructive sleep apnea (adult) (pediatric): Secondary | ICD-10-CM

## 2018-03-17 DIAGNOSIS — Z90722 Acquired absence of ovaries, bilateral: Secondary | ICD-10-CM | POA: Insufficient documentation

## 2018-03-17 DIAGNOSIS — E119 Type 2 diabetes mellitus without complications: Secondary | ICD-10-CM | POA: Diagnosis not present

## 2018-03-17 DIAGNOSIS — Z79811 Long term (current) use of aromatase inhibitors: Secondary | ICD-10-CM | POA: Diagnosis not present

## 2018-03-17 DIAGNOSIS — C50211 Malignant neoplasm of upper-inner quadrant of right female breast: Secondary | ICD-10-CM

## 2018-03-17 DIAGNOSIS — Z006 Encounter for examination for normal comparison and control in clinical research program: Secondary | ICD-10-CM | POA: Insufficient documentation

## 2018-03-17 LAB — CBC WITH DIFFERENTIAL/PLATELET
BASOS ABS: 0.1 10*3/uL (ref 0–0.1)
Basophils Relative: 1 %
EOS ABS: 0.1 10*3/uL (ref 0–0.7)
EOS PCT: 2 %
HCT: 37.8 % (ref 35.0–47.0)
Hemoglobin: 12.8 g/dL (ref 12.0–16.0)
LYMPHS PCT: 19 %
Lymphs Abs: 1.6 10*3/uL (ref 1.0–3.6)
MCH: 29.2 pg (ref 26.0–34.0)
MCHC: 33.9 g/dL (ref 32.0–36.0)
MCV: 86.3 fL (ref 80.0–100.0)
Monocytes Absolute: 0.5 10*3/uL (ref 0.2–0.9)
Monocytes Relative: 6 %
Neutro Abs: 6.1 10*3/uL (ref 1.4–6.5)
Neutrophils Relative %: 72 %
Platelets: 322 10*3/uL (ref 150–440)
RBC: 4.38 MIL/uL (ref 3.80–5.20)
RDW: 16.7 % — AB (ref 11.5–14.5)
WBC: 8.4 10*3/uL (ref 3.6–11.0)

## 2018-03-17 LAB — COMPREHENSIVE METABOLIC PANEL
ALT: 33 U/L (ref 14–54)
AST: 24 U/L (ref 15–41)
Albumin: 3.9 g/dL (ref 3.5–5.0)
Alkaline Phosphatase: 95 U/L (ref 38–126)
Anion gap: 10 (ref 5–15)
BILIRUBIN TOTAL: 0.4 mg/dL (ref 0.3–1.2)
BUN: 13 mg/dL (ref 6–20)
CO2: 24 mmol/L (ref 22–32)
CREATININE: 0.57 mg/dL (ref 0.44–1.00)
Calcium: 9.3 mg/dL (ref 8.9–10.3)
Chloride: 102 mmol/L (ref 101–111)
GFR calc non Af Amer: >60 mL/min (ref >60)
Glucose, Bld: 124 mg/dL — ABNORMAL HIGH (ref 65–99)
POTASSIUM: 4.5 mmol/L (ref 3.5–5.1)
Sodium: 136 mmol/L (ref 135–145)
Total Protein: 7.6 g/dL (ref 6.5–8.1)

## 2018-03-17 NOTE — Progress Notes (Signed)
[place] OFFICE PROGRESS NOTE  Patient Care Team: Santayana, Titus Mould, DO as PCP - General (Family Medicine) Bary Castilla, Forest Gleason, MD (General Surgery) [place], Titus Mould, DO as Referring Physician (Family Medicine)   SUMMARY OF ONCOLOGIC HISTORY:  Oncology History   # 2016- BREAST CANCER  STAGE II T1 cN1 M0 [s/p Lumpec & RT]ER/PR- Pos; HER-2/neu receptor negative. Mammaprint- Low risk; no chemo; Oct 2016- START TAM; Rogene [place] study/control arm.   # MARCH 2018- discon Tam [sec to weight gain]; start Arimidex  # Dec 2016-  TAH + BSO   # July 2018- BMD OSTEOPENIA- ca+vit D     Carcinoma of upper-inner quadrant of right breast in female, estrogen receptor positive ([place])       INTERVAL HISTORY:  52 year old female patient with above history of stage II breast cancer currently on arimidex is here for follow-up. She is also on the control arm of the [place] study.   Patient has been diagnosed with obstructive sleep apnea.  She is currently on BiPAP.  She is doing well.  Notes that she is sleeping well at night.  She continues to have intermittent hot flashes not on any medications.  This is not bothering her too much.  Otherwise denies any lumps or bumps. Denies any mood swings.  Appetite is good. No cough shortness of breath or chest pain.    REVIEW OF SYSTEMS:  A complete 10 point review of system is done which is negative except mentioned above/history of present illness.   PAST MEDICAL HISTORY :  Past Medical History:  Diagnosis Date  . BRCA negative 2016  . Breast cancer ([place]) 05/28/15   pT1c,N1a:2/3 SLN positive for macrometastisi,ER positive, PR positive, HER-2/neu not overexpressed c Radiation  . Dog bite of left lower leg 03/07/15  . HPV in female 2016  . Personal history of radiation therapy 2016   BREAST CA  . Pneumonia 02/2015  . Sleep apnea   . STD (female)     PAST SURGICAL HISTORY :   Past Surgical History:  Procedure  Laterality Date  . ABDOMINAL HYSTERECTOMY  12/01/2015   Procedure: total abdominal hysterectomty, bilateral salpingo- oopharectomy, cystoscopy ;  Surgeon: Benjaman Kindler, MD;  Location: [facility] ORS;  Service: Gynecology;;  . BREAST BIOPSY Right 6.7.16   Positive  . BREAST LUMPECTOMY Right 2016   BREAST CA  . BREAST LUMPECTOMY WITH SENTINEL LYMPH NODE BIOPSY Right 07/17/2015   Procedure: RIGHT BREAST WIDE EXCISION WITH SENTINEL NODE BIOPSY;  Surgeon: Robert Bellow, MD;  Location: [facility] ORS;  Service: General;  Laterality: Right;  . BREAST SURGERY Right 05/28/15  . COLPOSCOPY  05/28/15   High grade squamous intraepithelial lesion  . LAPAROSCOPIC VAGINAL HYSTERECTOMY WITH SALPINGO OOPHORECTOMY Bilateral 12/01/2015   Procedure: LAPAROSCOPIC ASSISTED VAGINAL HYSTERECTOMY WITH SALPINGO OOPHORECTOMY-converted to;  Surgeon: Benjaman Kindler, MD;  Location: [facility] ORS;  Service: Gynecology;  Laterality: Bilateral;  . WISDOM TOOTH EXTRACTION      FAMILY HISTORY :   Family History  Problem Relation Age of Onset  . Cancer Maternal Aunt 50       breast  . Breast cancer Maternal Aunt     SOCIAL HISTORY:   Social History   Tobacco Use  . Smoking status: Current Every Day Smoker    Packs/day: 1.00    Years: 36.00    Pack years: 36.00    Types: Cigarettes  . Smokeless tobacco: Never Used  Substance Use Topics  . Alcohol use: Yes  Alcohol/week: 0.0 oz    Comment: occ  . Drug use: No    ALLERGIES:  has No Known Allergies.  MEDICATIONS:  Current Outpatient Medications  Medication Sig Dispense Refill  . anastrozole (ARIMIDEX) 1 MG tablet Take 1 tablet (1 mg total) by mouth daily. 90 tablet 3  . Calcium-Vitamin D-Vitamin K (VIACTIV PO) Take 1 tablet by mouth daily.    . Cholecalciferol (VITAMIN D3) 2000 units TABS Take 1 tablet by mouth daily.    Marland Kitchen ibuprofen (ADVIL,MOTRIN) 600 MG tablet Take 1 tablet (600 mg total) by mouth every 6 (six) hours as needed (mild pain). 30 tablet 0  .  Multiple Vitamins-Minerals (MULTIVITAMIN ADULT) CHEW Chew 2 Doses by mouth daily.    . naproxen sodium (ANAPROX) 220 MG tablet Take 220 mg by mouth 2 (two) times daily with a meal.    . Vitamin D, Ergocalciferol, (DRISDOL) 50000 units CAPS capsule Take 1 capsule (50,000 Units total) by mouth every 7 (seven) days. 24 capsule 0   No current facility-administered medications for this visit.     PHYSICAL EXAMINATION: ECOG PERFORMANCE STATUS: 0 - Asymptomatic  BP (!) 130/93 (BP Location: Left Arm, Patient Position: Sitting)   Pulse (!) 59   Temp 98.4 F (36.9 C)   Resp 16   Wt 236 lb 6.4 oz (107.2 kg)   LMP 09/23/2015   BMI 41.88 kg/m   Filed Weights   03/17/18 1104  Weight: 236 lb 6.4 oz (107.2 kg)    GENERAL: Well-nourished well-developed; Alert, no distress and comfortable.   Alone. Obese.  EYES: no pallor or icterus OROPHARYNX: no thrush or ulceration; good dentition  NECK: supple, no masses felt LYMPH:  no palpable lymphadenopathy in the cervical, axillary or inguinal regions LUNGS: clear to auscultation and  No wheeze or crackles HEART/CVS: regular rate & rhythm and no murmurs; 1+ lower extremity edema right side ABDOMEN:abdomen soft, non-tender and normal bowel sounds Musculoskeletal:no cyanosis of digits and no clubbing  PSYCH: alert & oriented x 3 with fluent speech NEURO: no focal motor/sensory deficits SKIN:  no rashes or significant lesions except 5 mm hyperpigmented lesion noted under the left breast. Right and left BREAST exam [in the presence of nurse]- no unusual skin changes or dominant masses felt. Surgical scars noted.     LABORATORY DATA:  I have reviewed the data as listed    Component Value Date/Time   NA 136 03/17/2018 1030   K 4.5 03/17/2018 1030   CL 102 03/17/2018 1030   CO2 24 03/17/2018 1030   GLUCOSE 124 (H) 03/17/2018 1030   BUN 13 03/17/2018 1030   CREATININE 0.57 03/17/2018 1030   CALCIUM 9.3 03/17/2018 1030   PROT 7.6 03/17/2018 1030    ALBUMIN 3.9 03/17/2018 1030   AST 24 03/17/2018 1030   ALT 33 03/17/2018 1030   ALKPHOS 95 03/17/2018 1030   BILITOT 0.4 03/17/2018 1030   GFRNONAA >60 03/17/2018 1030   GFRAA >60 03/17/2018 1030    No results found for: SPEP, UPEP  Lab Results  Component Value Date   WBC 8.4 03/17/2018   NEUTROABS 6.1 03/17/2018   HGB 12.8 03/17/2018   HCT 37.8 03/17/2018   MCV 86.3 03/17/2018   PLT 322 03/17/2018      Chemistry      Component Value Date/Time   NA 136 03/17/2018 1030   K 4.5 03/17/2018 1030   CL 102 03/17/2018 1030   CO2 24 03/17/2018 1030   BUN 13 03/17/2018 1030  CREATININE 0.57 03/17/2018 1030      Component Value Date/Time   CALCIUM 9.3 03/17/2018 1030   ALKPHOS 95 03/17/2018 1030   AST 24 03/17/2018 1030   ALT 33 03/17/2018 1030   BILITOT 0.4 03/17/2018 1030         ASSESSMENT & PLAN:   Carcinoma of upper-inner quadrant of right breast in female, estrogen receptor positive ([place]) # Right Stage II Breast ca; ER/PR positive HER-2/neu negative  Currently on Anastrzole [BSO] on adjuvant basis.  # Clinically no evidence of recurrence. Control arm of PALLAS study. Mammogram July 2018-N.   # # OSTEOPNIA- BMD July 2018- on ca+vit D.  Discussed regarding multiple options  # 47m mole inframammary ; monitor.  # OSA/sleep study-significant improvement noted on BiPAP.   # DM- diet controlled-stable.  # follow up aug 2019/labs; mammogram July 2019; mammogram ordered.     GCammie Sickle MD 03/18/2018 11:50 AM

## 2018-03-17 NOTE — Progress Notes (Signed)
Patient is here today for a follow up. Patient states no new concerns today.  

## 2018-03-17 NOTE — Progress Notes (Signed)
Paula [name] returns to clinic today for her End of Treatment visit on the AFT-05 [place] study. Patient states she is still taking the Arimidex, but her last official on-study dose was on 02/07/2018. Patient states she was recently started on bi-pap for her severs sleep apnea, and states last night she slept for more than 7 hours. She started Bi-pap on 02/27/18, and prior to this had been on C-pap with the nasal cannula, which proved to be ineffective because she is a mouth breather. She admittedly feels better and more rested than she did on her last clinic visit. She also reports doing better with her newly diagnosed diabetes; which is diet controlled. Her blood sugar today was 124mg /dL - non-fasting, but patient states her fasting value this morning was 142. She has lost 13 lbs since her last visit and 18 lbs over the past year. States she is trying to decrease her carbohydrate intake and has almost completely cut out soft drinks now. Discussed benefits of low carb diet vs food exchange diet and patient states she is trying to be more aware of what she eats and avoid concentrated sweets. Reports her joint pain continues and she also experiences increased foot pain at times, usually while on her feet at work. Reports her fatigue has improved a lot and her hot flashes and night sweats have not been as bad. She continues to have some edema in her BLE, which she states has not improved and she related this to the Arimidex. B/P is a little elevated, but improved today and respirations have normalized. Continues taking a Calcium + Vit D supplement for osteopenia. Dr. Rogue Bussing in to examine patient. During breast exam, he noted a 11mm dark mole underneath patient's left lateral breast that did not change since her last examination. Patient denies any other new or changed medications since last visit, and Dr. Rogue Bussing offered for her to try oxybutinin for her hot flashes, but [name] refused stating the hot  flashes are not bothersome and she would rather not take any additional medication. ECOG PS - 0 today. Patient returned her completed medication diaries for cycles 24, 25 & 26 and was instructed that she will no longer have to complete the medication diaries for the study and will now enter into the follow up period. Discussed with Dr. Rogue Bussing that her visits will now be scheduled every 6 months, with the next one due on 08/07/18. She will no longer need labs per the study, but should continue to be followed per ASCO guidelines per the protocol. Informed the patient that she will have questionnaires to complete at the 3 year follow up visit and study labs due at the 5 year follow up visit. Ms. Miles is aware to contact me for any problems or questions she may have. Dr. Rogue Bussing to schedule patient's annual mammogram for July, 2019. Current AE's and attributions are listed below:  Adverse Event Log  Study/Protocol: AFT-05 [place]ALLAS Cycle: 24-26 (reported at EOT visit) Event Grade Onset Date Resolved Date Drug Name Attribution Treatment Comments   Joint pain Grade 1 09/2015  Arimidex Possibly  Generally improved, Rt knee worse   Hot flashes Grade 1 09/2015  Arimidex Probably  Pt declined tx  improved  Night sweats Grade 1 09/2015  Arimidex Probably  Pt declined tx  improved  Weight gain Grade 1 02/04/16 03/17/18 Arimidex Unrelated  Weight down 13 lbs today & 18 lbs overall Current weight just 4 lbs over baseline weight  Edema in BLE  Grade 1 Reported 06/30/16  Arimidex Possible  Swelling in BLE    Hyperglycemia Grade 1 11/16/17  Arimidex Unrelated  New dx of Diabetes Diet controlled at present  Sleep apnea "severe" Grade 3 11/07/17  Arimidex Unrelated  Now using Bi-pap to manage Sleep study performed 11/07/17  Paula [name], BSN, MHA, OCN 03/17/2018  11:47 AM

## 2018-03-17 NOTE — Assessment & Plan Note (Addendum)
#  Right Stage II Breast ca; ER/PR positive HER-2/neu negative  Currently on Anastrzole [BSO] on adjuvant basis.  # Clinically no evidence of recurrence. Control arm of PALLAS study. Mammogram July 2018-N.   # # OSTEOPNIA- BMD July 2018- on ca+vit D.  Discussed regarding multiple options  # 5mm mole inframammary ; monitor.  # OSA/sleep study-significant improvement noted on BiPAP.   # DM- diet controlled-stable.  # follow up aug 2019/labs; mammogram July 2019; mammogram ordered. 

## 2018-03-18 LAB — CANCER ANTIGEN 27.29: CA 27.29: 9.5 U/mL (ref 0.0–38.6)

## 2018-03-20 LAB — HEMOGLOBIN A1C
HEMOGLOBIN A1C: 6.8 % — AB (ref 4.8–5.6)
MEAN PLASMA GLUCOSE: 148.46 mg/dL

## 2018-06-26 ENCOUNTER — Ambulatory Visit
Admission: RE | Admit: 2018-06-26 | Discharge: 2018-06-26 | Disposition: A | Discharge status: Home / Self Care | Payer: Managed Care, Other (non HMO) | Source: Ambulatory Visit | Attending: Internal Medicine | Admitting: Internal Medicine

## 2018-06-26 DIAGNOSIS — C50211 Malignant neoplasm of upper-inner quadrant of right female breast: Secondary | ICD-10-CM | POA: Diagnosis present

## 2018-06-26 DIAGNOSIS — Z170 Estrogen receptor positive status [ER+]: Principal | ICD-10-CM

## 2018-08-07 ENCOUNTER — Other Ambulatory Visit: Payer: Self-pay

## 2018-08-07 ENCOUNTER — Encounter: Payer: Self-pay | Admitting: *Deleted

## 2018-08-07 ENCOUNTER — Inpatient Hospital Stay: Payer: Managed Care, Other (non HMO) | Attending: Internal Medicine | Admitting: Internal Medicine

## 2018-08-07 ENCOUNTER — Inpatient Hospital Stay: Payer: Managed Care, Other (non HMO)

## 2018-08-07 VITALS — BP 121/78 | HR 76 | Temp 97.2°F | Resp 20 | Ht 63.0 in | Wt 239.0 lb

## 2018-08-07 DIAGNOSIS — Z9989 Dependence on other enabling machines and devices: Secondary | ICD-10-CM

## 2018-08-07 DIAGNOSIS — Z720 Tobacco use: Secondary | ICD-10-CM | POA: Diagnosis not present

## 2018-08-07 DIAGNOSIS — G4733 Obstructive sleep apnea (adult) (pediatric): Secondary | ICD-10-CM | POA: Diagnosis not present

## 2018-08-07 DIAGNOSIS — Z170 Estrogen receptor positive status [ER+]: Secondary | ICD-10-CM

## 2018-08-07 DIAGNOSIS — M2550 Pain in unspecified joint: Secondary | ICD-10-CM | POA: Diagnosis not present

## 2018-08-07 DIAGNOSIS — G8929 Other chronic pain: Secondary | ICD-10-CM | POA: Insufficient documentation

## 2018-08-07 DIAGNOSIS — C50211 Malignant neoplasm of upper-inner quadrant of right female breast: Secondary | ICD-10-CM

## 2018-08-07 DIAGNOSIS — N951 Menopausal and female climacteric states: Secondary | ICD-10-CM

## 2018-08-07 DIAGNOSIS — M8580 Other specified disorders of bone density and structure, unspecified site: Secondary | ICD-10-CM | POA: Diagnosis not present

## 2018-08-07 LAB — COMPREHENSIVE METABOLIC PANEL
ALBUMIN: 3.6 g/dL (ref 3.5–5.0)
ALK PHOS: 91 U/L (ref 38–126)
ALT: 25 U/L (ref 0–44)
AST: 18 U/L (ref 15–41)
Anion gap: 9 (ref 5–15)
BUN: 13 mg/dL (ref 6–20)
CHLORIDE: 106 mmol/L (ref 98–111)
CO2: 26 mmol/L (ref 22–32)
CREATININE: 0.89 mg/dL (ref 0.44–1.00)
Calcium: 9 mg/dL (ref 8.9–10.3)
GFR calc Af Amer: >60 mL/min (ref >60)
GFR calc non Af Amer: >60 mL/min (ref >60)
GLUCOSE: 154 mg/dL — AB (ref 70–99)
Potassium: 4.3 mmol/L (ref 3.5–5.1)
SODIUM: 141 mmol/L (ref 135–145)
Total Bilirubin: 0.4 mg/dL (ref 0.3–1.2)
Total Protein: 7.1 g/dL (ref 6.5–8.1)

## 2018-08-07 LAB — CBC WITH DIFFERENTIAL/PLATELET
BASOS PCT: 1 %
Basophils Absolute: 0.1 10*3/uL (ref 0–0.1)
Eosinophils Absolute: 0.2 10*3/uL (ref 0–0.7)
Eosinophils Relative: 2 %
HEMATOCRIT: 37.2 % (ref 35.0–47.0)
HEMOGLOBIN: 12.2 g/dL (ref 12.0–16.0)
LYMPHS ABS: 1.7 10*3/uL (ref 1.0–3.6)
Lymphocytes Relative: 23 %
MCH: 29.3 pg (ref 26.0–34.0)
MCHC: 32.8 g/dL (ref 32.0–36.0)
MCV: 89.2 fL (ref 80.0–100.0)
MONO ABS: 0.4 10*3/uL (ref 0.2–0.9)
MONOS PCT: 6 %
Neutro Abs: 4.8 10*3/uL (ref 1.4–6.5)
Neutrophils Relative %: 68 %
Platelets: 261 10*3/uL (ref 150–440)
RBC: 4.17 MIL/uL (ref 3.80–5.20)
RDW: 17.3 % — AB (ref 11.5–14.5)
WBC: 7.1 10*3/uL (ref 3.6–11.0)

## 2018-08-07 NOTE — Progress Notes (Signed)
[place] OFFICE PROGRESS NOTE  Patient Care Team: Santayana, Titus Mould, DO as PCP - General (Family Medicine) Bary Castilla, Forest Gleason, MD (General Surgery) [place], Titus Mould, DO as Referring Physician [facility] Medicine)  Cancer Staging Carcinoma of upper-inner quadrant of right breast in female, estrogen receptor positive [facility]) Staging form: Breast, AJCC 7th Edition - Clinical: Stage IIA (T1c, N1, M0) - Signed by Forest Gleason, MD on 08/16/2015 - Pathologic: No stage assigned - Unsigned    Oncology History   # 2016- BREAST CANCER  STAGE II T1 cN1 M0 [s/p Lumpec & RT]ER/PR- Pos; HER-2/neu receptor negative. Mammaprint- Low risk; no chemo; Oct 2016- START TAM; Rogene [place] study/control arm.   # MARCH 2018- discon Tam [sec to weight gain]; start Arimidex  # Dec 2016-  TAH + BSO   # July 2018- BMD OSTEOPENIA- ca+vit D     Carcinoma of upper-inner quadrant of right breast in female, estrogen receptor positive ([place])      INTERVAL HISTORY:  Paula [name] 51 y.o.  female pleasant patient above history of stage II ER PR positive breast cancer currently on Arimidex is here for follow-up.  Patient continues to complain of chronic mild joint pains muscle pain.  This is not any worse.  Intermittent hot flashes stable.  Review of Systems  Constitutional: Negative for chills, diaphoresis, fever, malaise/fatigue and weight loss.  HENT: Negative for nosebleeds and sore throat.   Eyes: Negative for double vision.  Respiratory: Negative for cough, hemoptysis, sputum production, shortness of breath and wheezing.   Cardiovascular: Negative for chest pain, palpitations, orthopnea and leg swelling.  Gastrointestinal: Negative for abdominal pain, blood in stool, constipation, diarrhea, heartburn, melena, nausea and vomiting.  Genitourinary: Negative for dysuria, frequency and urgency.  Musculoskeletal: Positive for back pain and joint pain.  Skin: Negative.  Negative for  itching and rash.  Neurological: Negative for dizziness, tingling, focal weakness, weakness and headaches.  Endo/Heme/Allergies: Does not bruise/bleed easily.  Psychiatric/Behavioral: Negative for depression. The patient is not nervous/anxious and does not have insomnia.       PAST MEDICAL HISTORY :  Past Medical History:  Diagnosis Date  . BRCA negative 2016  . Breast cancer ([place]) 05/28/15   pT1c,N1a:2/3 SLN positive for macrometastisi,ER positive, PR positive, HER-2/neu not overexpressed c Radiation  . Dog bite of left lower leg 03/07/15  . HPV in female 2016  . Personal history of radiation therapy 2016   BREAST CA  . Pneumonia 02/2015  . Sleep apnea   . STD (female)     PAST SURGICAL HISTORY :   Past Surgical History:  Procedure Laterality Date  . ABDOMINAL HYSTERECTOMY  12/01/2015   Procedure: total abdominal hysterectomty, bilateral salpingo- oopharectomy, cystoscopy ;  Surgeon: Benjaman Kindler, MD;  Location: [facility] ORS;  Service: Gynecology;;  . BREAST BIOPSY Right 6.7.16   INVASIVE MAMMARY CARCINOMA.   Marland Kitchen BREAST LUMPECTOMY Right 2016   BREAST CA  . BREAST LUMPECTOMY WITH SENTINEL LYMPH NODE BIOPSY Right 07/17/2015   Procedure: RIGHT BREAST WIDE EXCISION WITH SENTINEL NODE BIOPSY;  Surgeon: Robert Bellow, MD;  Location: [facility] ORS;  Service: General;  Laterality: Right;  . BREAST SURGERY Right 05/28/15  . COLPOSCOPY  05/28/15   High grade squamous intraepithelial lesion  . LAPAROSCOPIC VAGINAL HYSTERECTOMY WITH SALPINGO OOPHORECTOMY Bilateral 12/01/2015   Procedure: LAPAROSCOPIC ASSISTED VAGINAL HYSTERECTOMY WITH SALPINGO OOPHORECTOMY-converted to;  Surgeon: Benjaman Kindler, MD;  Location: [facility] ORS;  Service: Gynecology;  Laterality: Bilateral;  . WISDOM TOOTH EXTRACTION  FAMILY HISTORY :   Family History  Problem Relation Age of Onset  . Cancer Maternal Aunt 50       breast  . Breast cancer Maternal Aunt     SOCIAL HISTORY:   Social History   Tobacco Use   . Smoking status: Current Every Day Smoker    Packs/day: 1.00    Years: 36.00    Pack years: 36.00    Types: Cigarettes  . Smokeless tobacco: Never Used  Substance Use Topics  . Alcohol use: Yes    Alcohol/week: 0.0 standard drinks    Comment: occ  . Drug use: No    ALLERGIES:  has No Known Allergies.  MEDICATIONS:  Current Outpatient Medications  Medication Sig Dispense Refill  . anastrozole (ARIMIDEX) 1 MG tablet Take 1 tablet (1 mg total) by mouth daily. 90 tablet 3  . Calcium-Vitamin D-Vitamin K (VIACTIV PO) Take 1 tablet by mouth daily.    . Cholecalciferol (VITAMIN D3) 2000 units TABS Take 1 tablet by mouth daily.    Marland Kitchen ibuprofen (ADVIL,MOTRIN) 600 MG tablet Take 1 tablet (600 mg total) by mouth every 6 (six) hours as needed (mild pain). 30 tablet 0  . Multiple Vitamins-Minerals (MULTIVITAMIN ADULT) CHEW Chew 2 Doses by mouth daily.    . naproxen sodium (ANAPROX) 220 MG tablet Take 220 mg by mouth 2 (two) times daily with a meal.    . Vitamin D, Ergocalciferol, (DRISDOL) 50000 units CAPS capsule Take 1 capsule (50,000 Units total) by mouth every 7 (seven) days. 24 capsule 0   No current facility-administered medications for this visit.     PHYSICAL EXAMINATION: ECOG PERFORMANCE STATUS: 0 - Asymptomatic  BP 121/78 (BP Location: Left Arm)   Pulse 76   Temp (!) 97.2 F (36.2 C) (Tympanic)   Resp 20   Ht '5\' 3"'$  (1.6 m)   Wt 239 lb (108.4 kg)   LMP 09/23/2015   BMI 42.34 kg/m   Filed Weights   08/07/18 1039  Weight: 239 lb (108.4 kg)    GENERAL: Well-nourished well-developed; Alert, no distress and comfortable.  Alone. EYES: no pallor or icterus OROPHARYNX: no thrush or ulceration; NECK: supple; no lymph nodes felt. LYMPH:  no palpable lymphadenopathy in the axillary or inguinal regions LUNGS: Decreased breath sounds auscultation bilaterally. No wheeze or crackles HEART/CVS: regular rate & rhythm and no murmurs; No lower extremity edema ABDOMEN:abdomen soft,  non-tender and normal bowel sounds. No hepatomegaly or splenomegaly.  Musculoskeletal:no cyanosis of digits and no clubbing  PSYCH: alert & oriented x 3 with fluent speech NEURO: no focal motor/sensory deficits SKIN:  no rashes or significant lesions Right and left BREAST exam [in the presence of nurse]- no unusual skin changes or dominant masses felt. Surgical scars noted.      LABORATORY DATA:  I have reviewed the data as listed    Component Value Date/Time   NA 141 08/07/2018 1006   K 4.3 08/07/2018 1006   CL 106 08/07/2018 1006   CO2 26 08/07/2018 1006   GLUCOSE 154 (H) 08/07/2018 1006   BUN 13 08/07/2018 1006   CREATININE 0.89 08/07/2018 1006   CALCIUM 9.0 08/07/2018 1006   PROT 7.1 08/07/2018 1006   ALBUMIN 3.6 08/07/2018 1006   AST 18 08/07/2018 1006   ALT 25 08/07/2018 1006   ALKPHOS 91 08/07/2018 1006   BILITOT 0.4 08/07/2018 1006   GFRNONAA >60 08/07/2018 1006   GFRAA >60 08/07/2018 1006    No results found for: SPEP, UPEP  Lab Results  Component Value Date   WBC 7.1 08/07/2018   NEUTROABS 4.8 08/07/2018   HGB 12.2 08/07/2018   HCT 37.2 08/07/2018   MCV 89.2 08/07/2018   PLT 261 08/07/2018      Chemistry      Component Value Date/Time   NA 141 08/07/2018 1006   K 4.3 08/07/2018 1006   CL 106 08/07/2018 1006   CO2 26 08/07/2018 1006   BUN 13 08/07/2018 1006   CREATININE 0.89 08/07/2018 1006      Component Value Date/Time   CALCIUM 9.0 08/07/2018 1006   ALKPHOS 91 08/07/2018 1006   AST 18 08/07/2018 1006   ALT 25 08/07/2018 1006   BILITOT 0.4 08/07/2018 1006       RADIOGRAPHIC STUDIES: I have personally reviewed the radiological images as listed and agreed with the findings in the report. No results found.   ASSESSMENT & PLAN:  Carcinoma of upper-inner quadrant of right breast in female, estrogen receptor positive ([place]) # Right Stage II Breast ca; ER/PR positive HER-2/neu negative  Currently on Anastrzole [BSO]/control arm of [place] study.   Stable.  July 2019 mammogram normal.  # # OSTEOPNIA- BMD July 2018- on ca+vit D.  Stable.  We will plan again bone density test in summer 2020.  # OSA/sleep study-significant improvement noted on BiPAP- STABLE.  # follow up in 6 months/alsb- dr.B   Orders Placed This Encounter  Procedures  . CBC with Differential/Platelet    Standing Status:   Future    Standing Expiration Date:   08/08/2019  . Comprehensive metabolic panel    Standing Status:   Future    Standing Expiration Date:   08/08/2019   All questions were answered. The patient knows to call the clinic with any problems, questions or concerns.      Cammie Sickle, MD 08/08/2018 7:16 AM

## 2018-08-07 NOTE — Assessment & Plan Note (Addendum)
#  Right Stage II Breast ca; ER/PR positive HER-2/neu negative  Currently on Anastrzole [BSO]/control arm of [place] study.  Stable.  July 2019 mammogram normal.  # # OSTEOPNIA- BMD July 2018- on ca+vit D.  Stable.  We will plan again bone density test in summer 2020.  # OSA/sleep study-significant improvement noted on BiPAP- STABLE.  # follow up in 6 months/alsb- dr.B

## 2018-08-07 NOTE — Progress Notes (Addendum)
Paula [name] returns to clinic today for her 6 month post-active treatment follow up visit on the AFT-05 [place] study. Patient states she is still taking the Arimidex, and admits she still experiences some joint, foot and new achilles pain that she attributes to it, but feels it is so much better for her than the Tamoxifen was. Most of her pain occurs during the day while patient is working, and must be continuously on her feet. She started Bi-pap on 02/27/18, and continues to rest better. She also reports doing better with her diet controlled diabetes and her blood sugar today was 154mg /dL - non-fasting. Reports her fatigue remains improved and her hot flashes and night sweats continue, but seem less severe. She continues to have some edema in her BLE, which she states has not improved and she also feels this is largely due to the Arimidex. B/P is a little elevated, but improved today and VS otherwise remain normal. Continues taking a Calcium + Vit D supplement for osteopenia. Dr. Rogue Bussing in to examine patient. He continues to follow a 1mm dark mole underneath patient's left lateral breast which has not changed since her last examination. Patient denies any other new or changed medications since last visit, and Dr. Rogue Bussing offered for her to try oxybutinin for her hot flashes, but Paula [name] continues to refuse. ECOG PS - 0 today. Patient returned her completed medication diaries for the past 6 months and was reminded again that she no longer needs to complete these for the study. Dr. Rogue Bussing has rescheduled her follow up visit in 6 months, with the next one due on 02/07/19. She will continue to be followed per ASCO guidelines per the protocol. She had her annual bilateral diagnostic mammogram on 06/26/18, and results were "BI-RADS CATEGORY 2: Benign." Reminded the patient that she will have questionnaires to complete at the 3 year follow up; which is her next visit coming up in February 2020, and study  labs are due at the 5 year follow up visit. Paula [name] is aware to contact me for any problems or questions she may have. Ongoing adverse events are as documented below:  Study/Protocol: AFT-05 PALLAS Cycle: 6 month post treatment follow-up Event Grade Onset Date Resolved Date Drug Name Attribution Treatment Comments   Joint pain Grade 1 09/2015  Arimidex Possibly  Generally improved, Rt knee worse   Hot flashes Grade 1 09/2015  Arimidex Probably  Pt declined tx  improved  Night sweats Grade 1 09/2015  Arimidex Probably  Pt declined tx  improved  Edema in BLE Grade 1 Reported 06/30/16  Arimidex Possible  Swelling in BLE    Hyperglycemia Grade 1 11/16/17  Arimidex Unrelated  New dx of Diabetes Diet controlled at present  Sleep apnea "severe" Grade 3 11/07/17  Arimidex Unrelated  Now using Bi-pap to manage Sleep study performed 11/07/17  [name], BSN, MHA, OCN 08/07/2018  11:47 AM

## 2018-08-08 LAB — CANCER ANTIGEN 27.29: CAN 27.29: 7.9 U/mL (ref 0.0–38.6)

## 2018-09-08 ENCOUNTER — Other Ambulatory Visit: Payer: Self-pay | Admitting: Internal Medicine

## 2018-09-08 DIAGNOSIS — C50211 Malignant neoplasm of upper-inner quadrant of right female breast: Secondary | ICD-10-CM

## 2018-09-11 NOTE — Telephone Encounter (Signed)
I do not see a Vitamin D level in her chart

## 2018-09-15 ENCOUNTER — Ambulatory Visit: Payer: Managed Care, Other (non HMO) | Admitting: Internal Medicine

## 2018-09-15 ENCOUNTER — Other Ambulatory Visit: Payer: Managed Care, Other (non HMO)

## 2018-11-30 ENCOUNTER — Other Ambulatory Visit: Payer: Self-pay | Admitting: Internal Medicine

## 2018-11-30 DIAGNOSIS — Z170 Estrogen receptor positive status [ER+]: Principal | ICD-10-CM

## 2018-11-30 DIAGNOSIS — C50211 Malignant neoplasm of upper-inner quadrant of right female breast: Secondary | ICD-10-CM

## 2018-12-28 ENCOUNTER — Other Ambulatory Visit: Payer: Self-pay

## 2018-12-28 ENCOUNTER — Ambulatory Visit
Admission: RE | Admit: 2018-12-28 | Discharge: 2018-12-28 | Disposition: A | Discharge status: Home / Self Care | Payer: 59 | Source: Ambulatory Visit | Attending: Radiation Oncology | Admitting: Radiation Oncology

## 2018-12-28 ENCOUNTER — Encounter: Payer: Self-pay | Admitting: Radiation Oncology

## 2018-12-28 VITALS — BP 133/85 | HR 80 | Temp 97.7°F | Resp 16 | Wt 235.0 lb

## 2018-12-28 DIAGNOSIS — Z923 Personal history of irradiation: Secondary | ICD-10-CM | POA: Diagnosis not present

## 2018-12-28 DIAGNOSIS — C50211 Malignant neoplasm of upper-inner quadrant of right female breast: Secondary | ICD-10-CM | POA: Insufficient documentation

## 2018-12-28 DIAGNOSIS — Z79811 Long term (current) use of aromatase inhibitors: Secondary | ICD-10-CM | POA: Diagnosis not present

## 2018-12-28 DIAGNOSIS — Z170 Estrogen receptor positive status [ER+]: Secondary | ICD-10-CM | POA: Insufficient documentation

## 2018-12-28 NOTE — Progress Notes (Signed)
Radiation Oncology Follow up Note  Name: Paula [name]   Date:   12/28/2018 MRN:  [id number] DOB: 02/25/1966    This 53 y.o. 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 Seen today in routine follow-up she is doing well. She specifically denies breast tenderness cough or bone pain.she is currently on arimadex tolerant that well without side effect.she mammograms back in July which I have reviewed were BI-RADS 2 benign.  COMPLICATIONS OF TREATMENT: none  FOLLOW UP COMPLIANCE: keeps appointments   PHYSICAL EXAM:  BP 133/85 (BP Location: Left Arm, Patient Position: Sitting)   Pulse 80   Temp 97.7 F (36.5 C) (Tympanic)   Resp 16   Wt 235 lb 0.2 oz (106.6 kg)   LMP 09/23/2015   BMI 41.63 kg/m  Well-developed well-nourished patient in NAD. HEENT reveals PERLA, EOMI, discs not visualized.  Oral cavity is clear. No oral mucosal lesions are identified. Neck is clear without evidence of cervical or supraclavicular adenopathy. Lungs are clear to A&P. Cardiac examination is essentially unremarkable with regular rate and rhythm without murmur rub or thrill. Abdomen is benign with no organomegaly or masses noted. Motor sensory and DTR levels are equal and symmetric in the upper and lower extremities. Cranial nerves II through XII are grossly intact. Proprioception is intact. No peripheral adenopathy or edema is identified. No motor or sensory levels are noted. Crude visual fields are within normal range.Lungs are clear to A&P cardiac examination essentially unremarkable with regular rate and rhythm. No dominant mass or nodularity is noted in either breast in 2  positions examined. Incision is well-healed. No axillary or supraclavicular adenopathy is appreciated. Cosmetic result isfair. She does have marked reduction of breast volume secondary to prior surgery and radiation changes.  RADIOLOGY RESULTS: mammograms reviewed and compatible with the above-stated findings  PLAN: present time she continues to do well with no evidence of disease. I'm turning follow-up care over to medical oncology. Patient knows to call with any concerns at any time.  I would like to take this opportunity to thank you for allowing me to participate in the care of your patient.Noreene Filbert, MD

## 2019-02-07 ENCOUNTER — Other Ambulatory Visit: Payer: Managed Care, Other (non HMO)

## 2019-02-07 ENCOUNTER — Ambulatory Visit: Payer: Managed Care, Other (non HMO) | Admitting: Internal Medicine

## 2019-02-13 ENCOUNTER — Inpatient Hospital Stay: Payer: 59 | Attending: Internal Medicine

## 2019-02-13 ENCOUNTER — Inpatient Hospital Stay (HOSPITAL_BASED_OUTPATIENT_CLINIC_OR_DEPARTMENT_OTHER): Payer: 59 | Admitting: Internal Medicine

## 2019-02-13 ENCOUNTER — Encounter: Payer: Self-pay | Admitting: Internal Medicine

## 2019-02-13 ENCOUNTER — Encounter: Payer: Self-pay | Admitting: *Deleted

## 2019-02-13 VITALS — BP 138/80 | HR 80 | Temp 98.1°F | Resp 16 | Wt 239.0 lb

## 2019-02-13 DIAGNOSIS — G4733 Obstructive sleep apnea (adult) (pediatric): Secondary | ICD-10-CM

## 2019-02-13 DIAGNOSIS — M791 Myalgia, unspecified site: Secondary | ICD-10-CM

## 2019-02-13 DIAGNOSIS — Z720 Tobacco use: Secondary | ICD-10-CM | POA: Insufficient documentation

## 2019-02-13 DIAGNOSIS — Z170 Estrogen receptor positive status [ER+]: Secondary | ICD-10-CM

## 2019-02-13 DIAGNOSIS — N951 Menopausal and female climacteric states: Secondary | ICD-10-CM

## 2019-02-13 DIAGNOSIS — M8580 Other specified disorders of bone density and structure, unspecified site: Secondary | ICD-10-CM

## 2019-02-13 DIAGNOSIS — C50211 Malignant neoplasm of upper-inner quadrant of right female breast: Secondary | ICD-10-CM | POA: Diagnosis present

## 2019-02-13 LAB — COMPREHENSIVE METABOLIC PANEL
ALT: 35 U/L (ref 0–44)
AST: 24 U/L (ref 15–41)
Albumin: 3.9 g/dL (ref 3.5–5.0)
Alkaline Phosphatase: 101 U/L (ref 38–126)
Anion gap: 8 (ref 5–15)
BUN: 11 mg/dL (ref 6–20)
CO2: 27 mmol/L (ref 22–32)
Calcium: 9.1 mg/dL (ref 8.9–10.3)
Chloride: 103 mmol/L (ref 98–111)
Creatinine, Ser: 0.65 mg/dL (ref 0.44–1.00)
GFR calc Af Amer: >60 mL/min (ref >60)
Glucose, Bld: 106 mg/dL — ABNORMAL HIGH (ref 70–99)
Potassium: 3.7 mmol/L (ref 3.5–5.1)
SODIUM: 138 mmol/L (ref 135–145)
TOTAL PROTEIN: 7.3 g/dL (ref 6.5–8.1)
Total Bilirubin: 0.4 mg/dL (ref 0.3–1.2)

## 2019-02-13 LAB — CBC WITH DIFFERENTIAL/PLATELET
Abs Immature Granulocytes: 0.03 10*3/uL (ref 0.00–0.07)
Basophils Absolute: 0 10*3/uL (ref 0.0–0.1)
Basophils Relative: 0 %
EOS PCT: 2 %
Eosinophils Absolute: 0.2 10*3/uL (ref 0.0–0.5)
HCT: 39.4 % (ref 36.0–46.0)
Hemoglobin: 12.6 g/dL (ref 12.0–15.0)
Immature Granulocytes: 0 %
Lymphocytes Relative: 25 %
Lymphs Abs: 2.4 10*3/uL (ref 0.7–4.0)
MCH: 29.4 pg (ref 26.0–34.0)
MCHC: 32 g/dL (ref 30.0–36.0)
MCV: 91.8 fL (ref 80.0–100.0)
Monocytes Absolute: 0.6 10*3/uL (ref 0.1–1.0)
Monocytes Relative: 6 %
Neutro Abs: 6.2 10*3/uL (ref 1.7–7.7)
Neutrophils Relative %: 67 %
Platelets: 259 10*3/uL (ref 150–400)
RBC: 4.29 MIL/uL (ref 3.87–5.11)
RDW: 15.5 % (ref 11.5–15.5)
WBC: 9.5 10*3/uL (ref 4.0–10.5)
nRBC: 0 % (ref 0.0–0.2)

## 2019-02-13 NOTE — Research (Signed)
Paula [name] returns to clinic today for her 12 month post-active treatment follow up visit on the AFT-05 PALLAS study. Patient report she is still taking the Arimidex and continues to experience some joint and foot pain. States most of her pain occurs during the day while she is working, and continuously on her feet. She started Bi-pap on 02/27/18, and continues to rest better. Reports still trying to do better with her diet controlled diabetes and her blood sugar today was improved at 106mg /dL - non-fasting. Reports her fatigue remains improved and her hot flashes and night sweats continue, but seem less severe. She continues to have some edema in her BLE, which she states has not improved and she also feels this is largely due to the Arimidex. B/P is a little elevated at 138/80, but VS otherwise remain normal. Continues taking a Calcium + Vit D supplement for osteopenia and Dr. Rogue Bussing is ordering a bone density test prior to her next clinic visit. Patient denies any other new or changed medications since last visit. Dr. Rogue Bussing has recommended that she try a Glucosamine-chondroitin supplement for her joint pain. ECOG PS - 0 today. Dr. Rogue Bussing in to perform H&P and breast exam. Patient will continue to be followed per ASCO guidelines per the protocol. She had her last bilateral diagnostic mammogram on 06/26/18, and results were "BI-RADS CATEGORY 2: Benign." [name] completed the [place] Scale and the EORTC QLQ-C30 questionnaires while in clinic today for her 3 year follow up and final time point. Study labs are due at the 5 year follow up visit. [name] is aware to contact me for any problems or questions she may have. She will return to see Dr. Rogue Bussing in 6 months on 08/14/2019 and will have her mammogram performed on 07/16/2019. Ongoing adverse events are as documented below:  Study/Protocol: AFT-05 PALLAS Cycle: 12 month post treatment follow-up; 36 month study visit Event Grade Onset  Date Resolved Date Drug Name Attribution Treatment Comments   Joint pain Grade 1 09/2015  Arimidex Possibly  Generally improved, Rt knee worse Dr. Rogue Bussing recommended Glucosamine chondroitin  Hot flashes Grade 1 09/2015  Arimidex Probably  Pt declined tx  improved  Night sweats Grade 1 09/2015  Arimidex Probably  Pt declined tx  improved  Edema in BLE Grade 1 Reported 06/30/16  Arimidex Possible  Swelling in BLE  Trace edema today - Rt > Lt  Hyperglycemia Grade 1 11/16/17  Arimidex Unrelated  New dx of Diabetes Diet controlled at present  Sleep apnea "severe" Grade 3 11/07/17  Arimidex Unrelated  Now using Bi-pap to manage Sleep study performed 11/07/17  Paula [name], BSN, MHA, OCN 02/13/2019 3:36 PM

## 2019-02-13 NOTE — Progress Notes (Signed)
[facility] OFFICE PROGRESS NOTE  Patient Care Team: Santayana, Titus Mould, DO as PCP - General (Family Medicine) Bary Castilla, Forest Gleason, MD (General Surgery) [place], Titus Mould, DO as Referring Physician [facility])  Cancer Staging Carcinoma of upper-inner quadrant of right breast in female, estrogen receptor positive [facility]) Staging form: Breast, AJCC 7th Edition - Clinical: Stage IIA (T1c, N1, M0) - Signed by Forest Gleason, MD on 08/16/2015 - Pathologic: No stage assigned - Unsigned    Oncology History   # 2016- BREAST CANCER  STAGE II T1 cN1 M0 [s/p Lumpec & RT]ER/PR- Pos; HER-2/neu receptor negative. Mammaprint- Low risk; no chemo; Oct 2016- START TAM; [place] study/control arm.   # MARCH 2018- discon Tam [sec to weight gain]; start Arimidex  # Dec 2016-  TAH + BSO   # July 2018- BMD OSTEOPENIA- ca+vit D     Carcinoma of upper-inner quadrant of right breast in female, estrogen receptor positive ([place])      INTERVAL HISTORY:  Paula [name] 53 y.o.  female pleasant patient above history of stage II ER PR positive breast cancer currently on Arimidex is here for follow-up.  Patient continues to have intermittent aches and pains in her joints and legs.  This is neither getting better or worse.  Intermittent hot flashes.  Denies any constant bone pain headaches or shortness of breath or cough.  Review of Systems  Constitutional: Negative for chills, diaphoresis, fever, malaise/fatigue and weight loss.  HENT: Negative for nosebleeds and sore throat.   Eyes: Negative for double vision.  Respiratory: Negative for cough, hemoptysis, sputum production, shortness of breath and wheezing.   Cardiovascular: Negative for chest pain, palpitations, orthopnea and leg swelling.  Gastrointestinal: Negative for abdominal pain, blood in stool, constipation, diarrhea, heartburn, melena, nausea and vomiting.  Genitourinary: Negative for dysuria, frequency and  urgency.  Musculoskeletal: Positive for back pain and joint pain.  Skin: Negative.  Negative for itching and rash.  Neurological: Negative for dizziness, tingling, focal weakness, weakness and headaches.  Endo/Heme/Allergies: Does not bruise/bleed easily.  Psychiatric/Behavioral: Negative for depression. The patient is not nervous/anxious and does not have insomnia.       PAST MEDICAL HISTORY :  Past Medical History:  Diagnosis Date  . BRCA negative 2016  . Breast cancer ([place]) 05/28/15   pT1c,N1a:2/3 SLN positive for macrometastisi,ER positive, PR positive, HER-2/neu not overexpressed c Radiation  . Dog bite of left lower leg 03/07/15  . HPV in female 2016  . Personal history of radiation therapy 2016   BREAST CA  . Pneumonia 02/2015  . Sleep apnea   . STD (female)     PAST SURGICAL HISTORY :   Past Surgical History:  Procedure Laterality Date  . ABDOMINAL HYSTERECTOMY  12/01/2015   Procedure: total abdominal hysterectomty, bilateral salpingo- oopharectomy, cystoscopy ;  Surgeon: Benjaman Kindler, MD;  Location: [facility] ORS;  Service: Gynecology;;  . BREAST BIOPSY Right 6.7.16   INVASIVE MAMMARY CARCINOMA.   Marland Kitchen BREAST LUMPECTOMY Right 2016   BREAST CA  . BREAST LUMPECTOMY WITH SENTINEL LYMPH NODE BIOPSY Right 07/17/2015   Procedure: RIGHT BREAST WIDE EXCISION WITH SENTINEL NODE BIOPSY;  Surgeon: Robert Bellow, MD;  Location: [facility] ORS;  Service: General;  Laterality: Right;  . BREAST SURGERY Right 05/28/15  . COLPOSCOPY  05/28/15   High grade squamous intraepithelial lesion  . LAPAROSCOPIC VAGINAL HYSTERECTOMY WITH SALPINGO OOPHORECTOMY Bilateral 12/01/2015   Procedure: LAPAROSCOPIC ASSISTED VAGINAL HYSTERECTOMY WITH SALPINGO OOPHORECTOMY-converted to;  Surgeon: Benjaman Kindler, MD;  Location: [facility] ORS;  Service: Gynecology;  Laterality: Bilateral;  . WISDOM TOOTH EXTRACTION      FAMILY HISTORY :   Family History  Problem Relation Age of Onset  . Cancer Maternal Aunt 50        breast  . Breast cancer Maternal Aunt     SOCIAL HISTORY:   Social History   Tobacco Use  . Smoking status: Current Every Day Smoker    Packs/day: 1.00    Years: 36.00    Pack years: 36.00    Types: Cigarettes  . Smokeless tobacco: Never Used  Substance Use Topics  . Alcohol use: Yes    Alcohol/week: 0.0 standard drinks    Comment: occ  . Drug use: No    ALLERGIES:  has No Known Allergies.  MEDICATIONS:  Current Outpatient Medications  Medication Sig Dispense Refill  . anastrozole (ARIMIDEX) 1 MG tablet TAKE 1 TABLET BY MOUTH EVERY DAY 30 tablet 11  . Calcium-Vitamin D-Vitamin K (VIACTIV PO) Take 1 tablet by mouth daily.    . Cholecalciferol (VITAMIN D3) 2000 units TABS Take 1 tablet by mouth daily.    Marland Kitchen ibuprofen (ADVIL,MOTRIN) 600 MG tablet Take 1 tablet (600 mg total) by mouth every 6 (six) hours as needed (mild pain). 30 tablet 0  . Multiple Vitamins-Minerals (MULTIVITAMIN ADULT) CHEW Chew 2 Doses by mouth daily.    . naproxen sodium (ANAPROX) 220 MG tablet Take 220 mg by mouth 2 (two) times daily with a meal.    . Vitamin D, Ergocalciferol, (DRISDOL) 50000 units CAPS capsule TAKE 1 CAPSULE (50,000 UNITS TOTAL) BY MOUTH EVERY 7 (SEVEN) DAYS. 12 capsule 1   No current facility-administered medications for this visit.     PHYSICAL EXAMINATION: ECOG PERFORMANCE STATUS: 0 - Asymptomatic  BP 138/80 (BP Location: Left Arm, Patient Position: Sitting, Cuff Size: Normal)   Pulse 80   Temp 98.1 F (36.7 C) (Tympanic)   Resp 16   Wt 239 lb (108.4 kg)   LMP 09/23/2015   BMI 42.34 kg/m   Filed Weights   02/13/19 1358  Weight: 239 lb (108.4 kg)    Physical Exam  Constitutional: She is oriented to person, place, and time and well-developed, well-nourished, and in no distress.  HENT:  Head: Normocephalic and atraumatic.  Mouth/Throat: Oropharynx is clear and moist. No oropharyngeal exudate.  Eyes: Pupils are equal, round, and reactive to light.  Neck: Normal range  of motion. Neck supple.  Cardiovascular: Normal rate and regular rhythm.  Pulmonary/Chest: No respiratory distress. She has no wheezes.  Abdominal: Soft. Bowel sounds are normal. She exhibits no distension and no mass. There is no abdominal tenderness. There is no rebound and no guarding.  Musculoskeletal: Normal range of motion.        General: No tenderness or edema.  Neurological: She is alert and oriented to person, place, and time.  Skin: Skin is warm.  Right and left BREAST exam (in the presence of nurse)- no unusual skin changes or dominant masses felt. Surgical scars noted.    Psychiatric: Affect normal.      LABORATORY DATA:  I have reviewed the data as listed    Component Value Date/Time   NA 138 02/13/2019 1320   K 3.7 02/13/2019 1320   CL 103 02/13/2019 1320   CO2 27 02/13/2019 1320   GLUCOSE 106 (H) 02/13/2019 1320   BUN 11 02/13/2019 1320   CREATININE 0.65 02/13/2019 1320   CALCIUM 9.1 02/13/2019 1320   PROT 7.3  02/13/2019 1320   ALBUMIN 3.9 02/13/2019 1320   AST 24 02/13/2019 1320   ALT 35 02/13/2019 1320   ALKPHOS 101 02/13/2019 1320   BILITOT 0.4 02/13/2019 1320   GFRNONAA >60 02/13/2019 1320   GFRAA >60 02/13/2019 1320    No results found for: SPEP, UPEP  Lab Results  Component Value Date   WBC 9.5 02/13/2019   NEUTROABS 6.2 02/13/2019   HGB 12.6 02/13/2019   HCT 39.4 02/13/2019   MCV 91.8 02/13/2019   PLT 259 02/13/2019      Chemistry      Component Value Date/Time   NA 138 02/13/2019 1320   K 3.7 02/13/2019 1320   CL 103 02/13/2019 1320   CO2 27 02/13/2019 1320   BUN 11 02/13/2019 1320   CREATININE 0.65 02/13/2019 1320      Component Value Date/Time   CALCIUM 9.1 02/13/2019 1320   ALKPHOS 101 02/13/2019 1320   AST 24 02/13/2019 1320   ALT 35 02/13/2019 1320   BILITOT 0.4 02/13/2019 1320       RADIOGRAPHIC STUDIES: I have personally reviewed the radiological images as listed and agreed with the findings in the report. No  results found.   ASSESSMENT & PLAN:  Carcinoma of upper-inner quadrant of right breast in female, estrogen receptor positive ([place]) # Right Stage II Breast ca; ER/PR positive HER-2/neu negative  Currently on Anastrzole [BSO]/control arm of [place] study.  Stable.  # Continue anastrazole; no evidence of disease.  Mammogram July 2019 normal.  #Myalgia/arthritic pain likely from Arimidex.  Recommend continued calcium plus vitamin D plus Osteo Bi-Flex.  # # OSTEOPNIA- BMD July 2018- on ca+vit D. Stable; will order BMD in 6 months.   # OSA/sleep study-significant improvement noted on BiPAP- STABLE.  # DISPOSITION:  # follow up in 6 months/alsb-cbc/cmp/ca-27-29 BMD prior- Dr.B   Orders Placed This Encounter  Procedures  . DG Bone Density    Standing Status:   Future    Standing Expiration Date:   02/13/2020    Order Specific Question:   Reason for Exam (SYMPTOM  OR DIAGNOSIS REQUIRED)    Answer:   Breast cancer    Order Specific Question:   Is the patient pregnant?    Answer:   No    Order Specific Question:   Preferred imaging location?    Answer:    [facility]  . CBC with Differential/Platelet    Standing Status:   Future    Standing Expiration Date:   02/14/2020  . Comprehensive Drug Analysis,[facility]    Standing Status:   Future    Standing Expiration Date:   02/14/2020  . Cancer antigen 27.29    Standing Status:   Future    Standing Expiration Date:   02/13/2020   All questions were answered. The patient knows to call the clinic with any problems, questions or concerns.      Cammie Sickle, MD 02/13/2019 4:56 PM

## 2019-02-13 NOTE — Assessment & Plan Note (Addendum)
#  Right Stage II Breast ca; ER/PR positive HER-2/neu negative  Currently on Anastrzole [BSO]/control arm of [place] study.  Stable.  # Continue anastrazole; no evidence of disease.  Mammogram July 2019 normal.  #Myalgia/arthritic pain likely from Arimidex.  Recommend continued calcium plus vitamin D plus Osteo Bi-Flex.  # # OSTEOPNIA- BMD July 2018- on ca+vit D. Stable; will order BMD in 6 months.   # OSA/sleep study-significant improvement noted on BiPAP- STABLE.  # DISPOSITION:  # follow up in 6 months/alsb-cbc/cmp/ca-27-29 BMD prior- Dr.B

## 2019-03-08 ENCOUNTER — Other Ambulatory Visit: Payer: Self-pay | Admitting: Internal Medicine

## 2019-03-08 DIAGNOSIS — C50211 Malignant neoplasm of upper-inner quadrant of right female breast: Secondary | ICD-10-CM

## 2019-06-04 ENCOUNTER — Telehealth: Payer: Self-pay | Admitting: *Deleted

## 2019-06-04 NOTE — Telephone Encounter (Signed)
T/C made to Catheryn Bacon to inform about the [place] patient letter that is being sent to her from AFT by [facility] explaining details about the sponsor stopping the actve portion of the study. Informed of the results of the recent study interim analysis showing that there is no real benefit in talking Palbociclib and the decision was made to stop the drug for women who are still in the active portion of the study and moving everyone to follow up. Ms. Simmering has been in follow up for more than a year now and was informed that this decision will not impact her. Patient states that she was not on the study drug in the first place, so none of this really affects her. States she will be on the look out for the letter and will contact me if she has any questions about it. [name], BSN, MHA, OCN 06/04/2019 11:11 AM

## 2019-07-16 ENCOUNTER — Ambulatory Visit
Admission: RE | Admit: 2019-07-16 | Discharge: 2019-07-16 | Disposition: A | Discharge status: Home / Self Care | Payer: 59 | Source: Ambulatory Visit | Attending: Internal Medicine | Admitting: Internal Medicine

## 2019-07-16 ENCOUNTER — Other Ambulatory Visit: Payer: Self-pay

## 2019-07-16 DIAGNOSIS — C50211 Malignant neoplasm of upper-inner quadrant of right female breast: Secondary | ICD-10-CM | POA: Diagnosis present

## 2019-07-16 DIAGNOSIS — Z170 Estrogen receptor positive status [ER+]: Secondary | ICD-10-CM | POA: Diagnosis present

## 2019-07-24 ENCOUNTER — Telehealth: Payer: Self-pay | Admitting: *Deleted

## 2019-07-24 ENCOUNTER — Other Ambulatory Visit: Payer: Self-pay | Admitting: *Deleted

## 2019-07-24 DIAGNOSIS — C50211 Malignant neoplasm of upper-inner quadrant of right female breast: Secondary | ICD-10-CM

## 2019-07-24 NOTE — Telephone Encounter (Signed)
Received t/c from patient this morning reporting she is overdue for her annual mammogram. States she had her bone density test performed and has osteoporosis in her spine. She has an appointment with Dr. Rogue Bussing on 08/14/2019 and would like to have her mammogram scheduled on that day since she is already off work anyway. Message sent to Dr. Rogue Bussing and his team to get the mammogram scheduled. Patient states she called, but they would not allow her to set up the appointment herself. [name], BSN, MHA, OCN 07/24/2019 9:29 AM

## 2019-08-14 ENCOUNTER — Ambulatory Visit: Payer: 59 | Admitting: Internal Medicine

## 2019-08-14 ENCOUNTER — Other Ambulatory Visit: Payer: 59

## 2019-08-14 ENCOUNTER — Other Ambulatory Visit: Payer: Self-pay

## 2019-08-15 ENCOUNTER — Ambulatory Visit
Admission: RE | Admit: 2019-08-15 | Discharge: 2019-08-15 | Disposition: A | Discharge status: Home / Self Care | Payer: 59 | Source: Ambulatory Visit | Attending: Internal Medicine | Admitting: Internal Medicine

## 2019-08-15 ENCOUNTER — Encounter: Payer: Self-pay | Admitting: Internal Medicine

## 2019-08-15 ENCOUNTER — Inpatient Hospital Stay: Payer: 59 | Admitting: *Deleted

## 2019-08-15 ENCOUNTER — Other Ambulatory Visit: Payer: Self-pay

## 2019-08-15 ENCOUNTER — Inpatient Hospital Stay (HOSPITAL_BASED_OUTPATIENT_CLINIC_OR_DEPARTMENT_OTHER): Payer: 59 | Admitting: Internal Medicine

## 2019-08-15 DIAGNOSIS — M810 Age-related osteoporosis without current pathological fracture: Secondary | ICD-10-CM | POA: Insufficient documentation

## 2019-08-15 DIAGNOSIS — Z79899 Other long term (current) drug therapy: Secondary | ICD-10-CM | POA: Insufficient documentation

## 2019-08-15 DIAGNOSIS — Z170 Estrogen receptor positive status [ER+]: Secondary | ICD-10-CM | POA: Insufficient documentation

## 2019-08-15 DIAGNOSIS — Z803 Family history of malignant neoplasm of breast: Secondary | ICD-10-CM | POA: Insufficient documentation

## 2019-08-15 DIAGNOSIS — C50211 Malignant neoplasm of upper-inner quadrant of right female breast: Secondary | ICD-10-CM

## 2019-08-15 DIAGNOSIS — Z923 Personal history of irradiation: Secondary | ICD-10-CM | POA: Insufficient documentation

## 2019-08-15 DIAGNOSIS — F17210 Nicotine dependence, cigarettes, uncomplicated: Secondary | ICD-10-CM | POA: Insufficient documentation

## 2019-08-15 DIAGNOSIS — G4733 Obstructive sleep apnea (adult) (pediatric): Secondary | ICD-10-CM | POA: Insufficient documentation

## 2019-08-15 DIAGNOSIS — Z9079 Acquired absence of other genital organ(s): Secondary | ICD-10-CM | POA: Insufficient documentation

## 2019-08-15 DIAGNOSIS — Z90722 Acquired absence of ovaries, bilateral: Secondary | ICD-10-CM | POA: Insufficient documentation

## 2019-08-15 DIAGNOSIS — G4730 Sleep apnea, unspecified: Secondary | ICD-10-CM | POA: Insufficient documentation

## 2019-08-15 DIAGNOSIS — Z79811 Long term (current) use of aromatase inhibitors: Secondary | ICD-10-CM | POA: Insufficient documentation

## 2019-08-15 DIAGNOSIS — Z90710 Acquired absence of both cervix and uterus: Secondary | ICD-10-CM | POA: Insufficient documentation

## 2019-08-15 LAB — CBC WITH DIFFERENTIAL/PLATELET
Abs Immature Granulocytes: 0.04 10*3/uL (ref 0.00–0.07)
Basophils Absolute: 0.1 10*3/uL (ref 0.0–0.1)
Basophils Relative: 1 %
Eosinophils Absolute: 0.2 10*3/uL (ref 0.0–0.5)
Eosinophils Relative: 2 %
HCT: 41 % (ref 36.0–46.0)
Hemoglobin: 13.3 g/dL (ref 12.0–15.0)
Immature Granulocytes: 0 %
Lymphocytes Relative: 25 %
Lymphs Abs: 2.3 10*3/uL (ref 0.7–4.0)
MCH: 29.8 pg (ref 26.0–34.0)
MCHC: 32.4 g/dL (ref 30.0–36.0)
MCV: 91.7 fL (ref 80.0–100.0)
Monocytes Absolute: 0.6 10*3/uL (ref 0.1–1.0)
Monocytes Relative: 7 %
Neutro Abs: 5.8 10*3/uL (ref 1.7–7.7)
Neutrophils Relative %: 65 %
Platelets: 242 10*3/uL (ref 150–400)
RBC: 4.47 MIL/uL (ref 3.87–5.11)
RDW: 15.2 % (ref 11.5–15.5)
WBC: 8.9 10*3/uL (ref 4.0–10.5)
nRBC: 0 % (ref 0.0–0.2)

## 2019-08-15 LAB — COMPREHENSIVE METABOLIC PANEL
ALT: 31 U/L (ref 0–44)
AST: 20 U/L (ref 15–41)
Albumin: 4 g/dL (ref 3.5–5.0)
Alkaline Phosphatase: 96 U/L (ref 38–126)
Anion gap: 9 (ref 5–15)
BUN: 12 mg/dL (ref 6–20)
CO2: 24 mmol/L (ref 22–32)
Calcium: 9.2 mg/dL (ref 8.9–10.3)
Chloride: 107 mmol/L (ref 98–111)
Creatinine, Ser: 0.6 mg/dL (ref 0.44–1.00)
GFR calc Af Amer: >60 mL/min (ref >60)
GFR calc non Af Amer: >60 mL/min (ref >60)
Glucose, Bld: 182 mg/dL — ABNORMAL HIGH (ref 70–99)
Potassium: 4 mmol/L (ref 3.5–5.1)
Sodium: 140 mmol/L (ref 135–145)
Total Bilirubin: 0.4 mg/dL (ref 0.3–1.2)
Total Protein: 7.4 g/dL (ref 6.5–8.1)

## 2019-08-15 NOTE — Progress Notes (Signed)
[place] OFFICE PROGRESS NOTE  Patient Care Team: [place], [facility] as PCP - General Byrnett, Forest Gleason, MD (General Surgery) [place], Titus Mould, DO as Referring Physician [facility] Medicine)  Cancer Staging Carcinoma of upper-inner quadrant of right breast in female, estrogen receptor positive [facility]) Staging form: Breast, AJCC 7th Edition - Clinical: Stage IIA (T1c, N1, M0) - Signed by Forest Gleason, MD on 08/16/2015 - Pathologic: No stage assigned - Unsigned    Oncology History Overview Note  # 2016- BREAST CANCER  STAGE II T1 cN1 M0 [s/p Lumpec & RT]ER/PR- Pos; HER-2/neu receptor negative. Mammaprint- Low risk; no chemo; Oct 2016- START TAM; [place] study/control arm.   # MARCH 2018- discon Tam [sec to weight gain]; start Arimidex  # Dec 2016-  TAH + BSO   # AUG 2020- OSTEOPOROSIS- Reclast   Carcinoma of upper-inner quadrant of right breast in female, estrogen receptor positive ([place])     INTERVAL HISTORY:  Paula [name] 53 y.o.  female pleasant patient above history of stage II ER PR positive breast cancer currently on Arimidex is here for follow-up/review results of bone density.  Patient denies any abdominal pain or bone pain.  Continues to have chronic intermittent joint pains-not any worse.  Intermittent hot flashes.  Not any worse.  No headaches.  No new shortness of breath or cough.  Review of Systems  Constitutional: Negative for chills, diaphoresis, fever, malaise/fatigue and weight loss.  HENT: Negative for nosebleeds and sore throat.   Eyes: Negative for double vision.  Respiratory: Negative for cough, hemoptysis, sputum production, shortness of breath and wheezing.   Cardiovascular: Negative for chest pain, palpitations, orthopnea and leg swelling.  Gastrointestinal: Negative for abdominal pain, blood in stool, constipation, diarrhea, heartburn, melena, nausea and vomiting.  Genitourinary: Negative for dysuria, frequency and  urgency.  Musculoskeletal: Positive for back pain and joint pain.  Skin: Negative.  Negative for itching and rash.  Neurological: Negative for dizziness, tingling, focal weakness, weakness and headaches.  Endo/Heme/Allergies: Does not bruise/bleed easily.  Psychiatric/Behavioral: Negative for depression. The patient is not nervous/anxious and does not have insomnia.       PAST MEDICAL HISTORY :  Past Medical History:  Diagnosis Date  . BRCA negative 2016  . Breast cancer ([place]) 05/28/15   pT1c,N1a:2/3 SLN positive for macrometastisi,ER positive, PR positive, HER-2/neu not overexpressed c Radiation  . Dog bite of left lower leg 03/07/15  . HPV in female 2016  . Personal history of radiation therapy 2016   BREAST CA  . Pneumonia 02/2015  . Sleep apnea   . STD (female)     PAST SURGICAL HISTORY :   Past Surgical History:  Procedure Laterality Date  . ABDOMINAL HYSTERECTOMY  12/01/2015   Procedure: total abdominal hysterectomty, bilateral salpingo- oopharectomy, cystoscopy ;  Surgeon: Benjaman Kindler, MD;  Location: [facility] ORS;  Service: Gynecology;;  . BREAST BIOPSY Right 6.7.16   INVASIVE MAMMARY CARCINOMA.   Marland Kitchen BREAST LUMPECTOMY Right 2016   BREAST CA  . BREAST LUMPECTOMY WITH SENTINEL LYMPH NODE BIOPSY Right 07/17/2015   Procedure: RIGHT BREAST WIDE EXCISION WITH SENTINEL NODE BIOPSY;  Surgeon: Robert Bellow, MD;  Location: [facility] ORS;  Service: General;  Laterality: Right;  . BREAST SURGERY Right 05/28/15  . COLPOSCOPY  05/28/15   High grade squamous intraepithelial lesion  . LAPAROSCOPIC VAGINAL HYSTERECTOMY WITH SALPINGO OOPHORECTOMY Bilateral 12/01/2015   Procedure: LAPAROSCOPIC ASSISTED VAGINAL HYSTERECTOMY WITH SALPINGO OOPHORECTOMY-converted to;  Surgeon: Benjaman Kindler, MD;  Location: [facility]  ORS;  Service: Gynecology;  Laterality: Bilateral;  . WISDOM TOOTH EXTRACTION      FAMILY HISTORY :   Family History  Problem Relation Age of Onset  . Cancer Maternal Aunt 50        breast  . Breast cancer Maternal Aunt     SOCIAL HISTORY:   Social History   Tobacco Use  . Smoking status: Current Every Day Smoker    Packs/day: 1.00    Years: 36.00    Pack years: 36.00    Types: Cigarettes  . Smokeless tobacco: Never Used  Substance Use Topics  . Alcohol use: Yes    Alcohol/week: 0.0 standard drinks    Comment: occ  . Drug use: No    ALLERGIES:  has No Known Allergies.  MEDICATIONS:  Current Outpatient Medications  Medication Sig Dispense Refill  . anastrozole (ARIMIDEX) 1 MG tablet TAKE 1 TABLET BY MOUTH EVERY DAY 30 tablet 11  . Calcium-Vitamin D-Vitamin K (VIACTIV PO) Take 1 tablet by mouth daily.    . Cholecalciferol (VITAMIN D3) 2000 units TABS Take 1 tablet by mouth daily.    Marland Kitchen ibuprofen (ADVIL,MOTRIN) 600 MG tablet Take 1 tablet (600 mg total) by mouth every 6 (six) hours as needed (mild pain). 30 tablet 0  . Multiple Vitamins-Minerals (MULTIVITAMIN ADULT) CHEW Chew 2 Doses by mouth daily.    . naproxen sodium (ANAPROX) 220 MG tablet Take 220 mg by mouth 2 (two) times daily with a meal.    . Vitamin D, Ergocalciferol, (DRISDOL) 1.25 MG (50000 UT) CAPS capsule TAKE 1 CAPSULE (50,000 UNITS TOTAL) BY MOUTH EVERY 7 (SEVEN) DAYS. 12 capsule 1   No current facility-administered medications for this visit.     PHYSICAL EXAMINATION: ECOG PERFORMANCE STATUS: 0 - Asymptomatic  BP 133/75 (BP Location: Left Arm, Patient Position: Sitting, Cuff Size: Normal)   Pulse 93   Temp 98 F (36.7 C) (Tympanic)   Resp 16   Wt 245 lb (111.1 kg)   LMP 09/23/2015   BMI 43.40 kg/m   Filed Weights   08/15/19 1429  Weight: 245 lb (111.1 kg)    Physical Exam  Constitutional: She is oriented to person, place, and time and well-developed, well-nourished, and in no distress.  HENT:  Head: Normocephalic and atraumatic.  Mouth/Throat: Oropharynx is clear and moist. No oropharyngeal exudate.  Eyes: Pupils are equal, round, and reactive to light.  Neck: Normal  range of motion. Neck supple.  Cardiovascular: Normal rate and regular rhythm.  Pulmonary/Chest: No respiratory distress. She has no wheezes.  Abdominal: Soft. Bowel sounds are normal. She exhibits no distension and no mass. There is no abdominal tenderness. There is no rebound and no guarding.  Musculoskeletal: Normal range of motion.        General: No tenderness or edema.  Neurological: She is alert and oriented to person, place, and time.  Skin: Skin is warm.  Deferred breast exam patient noted to have a recent breast exam with PCP.   Psychiatric: Affect normal.      LABORATORY DATA:  I have reviewed the data as listed    Component Value Date/Time   NA 140 08/15/2019 1359   K 4.0 08/15/2019 1359   CL 107 08/15/2019 1359   CO2 24 08/15/2019 1359   GLUCOSE 182 (H) 08/15/2019 1359   BUN 12 08/15/2019 1359   CREATININE 0.60 08/15/2019 1359   CALCIUM 9.2 08/15/2019 1359   PROT 7.4 08/15/2019 1359   ALBUMIN 4.0 08/15/2019 1359  AST 20 08/15/2019 1359   ALT 31 08/15/2019 1359   ALKPHOS 96 08/15/2019 1359   BILITOT 0.4 08/15/2019 1359   GFRNONAA >60 08/15/2019 1359   GFRAA >60 08/15/2019 1359    No results found for: SPEP, UPEP  Lab Results  Component Value Date   WBC 8.9 08/15/2019   NEUTROABS 5.8 08/15/2019   HGB 13.3 08/15/2019   HCT 41.0 08/15/2019   MCV 91.7 08/15/2019   PLT 242 08/15/2019      Chemistry      Component Value Date/Time   NA 140 08/15/2019 1359   K 4.0 08/15/2019 1359   CL 107 08/15/2019 1359   CO2 24 08/15/2019 1359   BUN 12 08/15/2019 1359   CREATININE 0.60 08/15/2019 1359      Component Value Date/Time   CALCIUM 9.2 08/15/2019 1359   ALKPHOS 96 08/15/2019 1359   AST 20 08/15/2019 1359   ALT 31 08/15/2019 1359   BILITOT 0.4 08/15/2019 1359       RADIOGRAPHIC STUDIES: I have personally reviewed the radiological images as listed and agreed with the findings in the report. Mm Diag Breast Tomo Bilateral  Result Date:  08/15/2019 CLINICAL DATA:  History of right breast cancer status post lumpectomy in 2016. EXAM: DIGITAL DIAGNOSTIC BILATERAL MAMMOGRAM WITH CAD AND TOMO COMPARISON:  Previous exam(s). ACR Breast Density Category b: There are scattered areas of fibroglandular density. FINDINGS: Lumpectomy changes are seen in the right breast. Dystrophic calcifications are seen in the lumpectomy site. No suspicious mass or malignant type microcalcifications identified in either breast. Mammographic images were processed with CAD. IMPRESSION: No evidence of malignancy in either breast. RECOMMENDATION: Bilateral diagnostic mammogram in 1 year is recommended. I have discussed the findings and recommendations with the patient. Results were also provided in writing at the conclusion of the visit. If applicable, a reminder letter will be sent to the patient regarding the next appointment. BI-RADS CATEGORY  2: Benign. Electronically Signed   By: Lillia Mountain M.D.   On: 08/15/2019 15:45     ASSESSMENT & PLAN:  Carcinoma of upper-inner quadrant of right breast in female, estrogen receptor positive ([place]) # Right Stage II Breast ca; ER/PR positive HER-2/neu negative  Currently on Anastrzole [BSO]/control arm of [place] study.  Stable.  # Continue anastrazole; no evidence of disease.  Mammogram-awaiting this week.  #Myalgia/arthritic pain likely from Arimidex.  Recommend continued calcium plus vitamin D plus Osteo Bi-Flex.  Stable  # # OSTEOPOROSIS-BMD August 2020- Discussed the potential risk factors for osteoporosis- age/gender/postmenopausal status/use of anti-estrogen treatments. Discussed multiple options including exercise/ calcium and vitamin D supplementation/ and also use of bisphosphonates. Discussed oral bisphosphonates versus parenteral bisphosphonate like Reclast/ RANK ligand inhibitors- desnosumab. Discussed the potential benefits and/side effects  Including but not limited to Osteonecrosis of jaw/ hypocalcemia.  Patient  interested in [place].  Ordered.  # OSA/sleep study-significant improvement noted on BiPAP-stable  # DISPOSITION:  # Reclast next week/afternoons.   # follow up in 6 months/labscbc/cmp/ca-27-29-Dr.B   No orders of the defined types were placed in this encounter.  All questions were answered. The patient knows to call the clinic with any problems, questions or concerns.      Paula Sickle, MD 08/16/2019 8:18 AM

## 2019-08-15 NOTE — Research (Signed)
Patient in to clinic for her 42 month follow up visit on the AFT-05 PALLAS protocol. She reports continuing to take her Arimidex as ordered. She states she is having the same side effects basically as always. She says she continues to have swelling of her lower extremities and hands, joint pain, sore throat at times, lower back pain and new osteoporosis per her bone density procedure she had completed. She denies any new anticancer medications at this time. She states her back pain is worse with moving and standing eight hours per day on her job. Dr. Rogue Bussing examined patient at this time and reviewed her bone density score of -2.9 with the patient. After review of all choices to manage her osteoporosis the patient decided she wanted to take the reclast annually as long as her insurance would cover the treatment. The patient is scheduled for her mammogram this afternoon to assess disease status. She also states her diabetes is pretty much under control with medication only in the am. She was reminded she will be due for her protocol questionnaires at her next 48 month visit and to notify research department with any questions, concerns or issues.   Ongoing adverse events are as documented below:  Study/Protocol: AFT-05 PALLAS Cycle: 42 Month Follow Up Visit Event Grade Onset Date Resolved Date Drug Name Attribution Treatment Comments   Joint pain Grade 1 09/2015  Arimidex Possibly  Generally improved, Rt knee worse Dr. Rogue Bussing recommended Glucosamine chondroitin  Hot flashes Grade 1 09/2015  Arimidex Probably  Pt declined tx  improved  Night sweats Grade 1 09/2015  Arimidex Probably  Pt declined tx  improved  Edema in BLE Grade 1 Reported 06/30/16  Arimidex Possible  Swelling in BLE  Trace edema today - Rt > Lt  Hyperglycemia Grade 1 11/16/17  Arimidex Unrelated  New dx of Diabetes Diet controlled at present  Osteoporosis Grade  2 08/15/19  Armidex Possible  New diagnosis Dr. Rogue Bussing  ordered reclast at patient request.   Sleep apnea "severe" Grade 3 11/07/17  Arimidex Unrelated  Now using Bi-pap to manage Sleep study performed 11/07/17   08/15/2019 3:31 PM

## 2019-08-15 NOTE — Assessment & Plan Note (Addendum)
#  Right Stage II Breast ca; ER/PR positive HER-2/neu negative  Currently on Anastrzole [BSO]/control arm of [place] study.  Stable.  # Continue anastrazole; no evidence of disease.  Mammogram-awaiting this week.  #Myalgia/arthritic pain likely from Arimidex.  Recommend continued calcium plus vitamin D plus Osteo Bi-Flex.  Stable  # # OSTEOPOROSIS-BMD August 2020- Discussed the potential risk factors for osteoporosis- age/gender/postmenopausal status/use of anti-estrogen treatments. Discussed multiple options including exercise/ calcium and vitamin D supplementation/ and also use of bisphosphonates. Discussed oral bisphosphonates versus parenteral bisphosphonate like Reclast/ RANK ligand inhibitors- desnosumab. Discussed the potential benefits and/side effects  Including but not limited to Osteonecrosis of jaw/ hypocalcemia.  Patient interested in [place].  Ordered.  # OSA/sleep study-significant improvement noted on BiPAP-stable  # DISPOSITION:  # Reclast next week/afternoons.   # follow up in 6 months/labscbc/cmp/ca-27-29-Dr.B

## 2019-08-16 LAB — CANCER ANTIGEN 27.29: CA 27.29: 4.9 U/mL (ref 0.0–38.6)

## 2019-08-22 ENCOUNTER — Inpatient Hospital Stay: Payer: 59

## 2019-09-14 ENCOUNTER — Other Ambulatory Visit: Payer: Self-pay

## 2019-09-17 ENCOUNTER — Inpatient Hospital Stay: Payer: 59

## 2019-09-17 ENCOUNTER — Other Ambulatory Visit: Payer: Self-pay

## 2019-09-17 ENCOUNTER — Inpatient Hospital Stay: Payer: 59 | Attending: Internal Medicine

## 2019-09-17 VITALS — BP 138/85 | HR 74 | Temp 98.8°F | Resp 18

## 2019-09-17 DIAGNOSIS — M810 Age-related osteoporosis without current pathological fracture: Secondary | ICD-10-CM | POA: Diagnosis not present

## 2019-09-17 DIAGNOSIS — C50211 Malignant neoplasm of upper-inner quadrant of right female breast: Secondary | ICD-10-CM

## 2019-09-17 DIAGNOSIS — Z170 Estrogen receptor positive status [ER+]: Secondary | ICD-10-CM

## 2019-09-17 LAB — CBC WITH DIFFERENTIAL/PLATELET
Abs Immature Granulocytes: 0.02 10*3/uL (ref 0.00–0.07)
Basophils Absolute: 0 10*3/uL (ref 0.0–0.1)
Basophils Relative: 1 %
Eosinophils Absolute: 0.2 10*3/uL (ref 0.0–0.5)
Eosinophils Relative: 2 %
HCT: 39.8 % (ref 36.0–46.0)
Hemoglobin: 13.1 g/dL (ref 12.0–15.0)
Immature Granulocytes: 0 %
Lymphocytes Relative: 23 %
Lymphs Abs: 1.8 10*3/uL (ref 0.7–4.0)
MCH: 30.2 pg (ref 26.0–34.0)
MCHC: 32.9 g/dL (ref 30.0–36.0)
MCV: 91.7 fL (ref 80.0–100.0)
Monocytes Absolute: 0.6 10*3/uL (ref 0.1–1.0)
Monocytes Relative: 8 %
Neutro Abs: 5.3 10*3/uL (ref 1.7–7.7)
Neutrophils Relative %: 66 %
Platelets: 233 10*3/uL (ref 150–400)
RBC: 4.34 MIL/uL (ref 3.87–5.11)
RDW: 14.8 % (ref 11.5–15.5)
WBC: 7.9 10*3/uL (ref 4.0–10.5)
nRBC: 0 % (ref 0.0–0.2)

## 2019-09-17 LAB — COMPREHENSIVE METABOLIC PANEL
ALT: 37 U/L (ref 0–44)
AST: 22 U/L (ref 15–41)
Albumin: 3.8 g/dL (ref 3.5–5.0)
Alkaline Phosphatase: 96 U/L (ref 38–126)
Anion gap: 8 (ref 5–15)
BUN: 12 mg/dL (ref 6–20)
CO2: 26 mmol/L (ref 22–32)
Calcium: 9 mg/dL (ref 8.9–10.3)
Chloride: 103 mmol/L (ref 98–111)
Creatinine, Ser: 0.75 mg/dL (ref 0.44–1.00)
GFR calc Af Amer: >60 mL/min (ref >60)
GFR calc non Af Amer: >60 mL/min (ref >60)
Glucose, Bld: 192 mg/dL — ABNORMAL HIGH (ref 70–99)
Potassium: 4.2 mmol/L (ref 3.5–5.1)
Sodium: 137 mmol/L (ref 135–145)
Total Bilirubin: 0.4 mg/dL (ref 0.3–1.2)
Total Protein: 7.2 g/dL (ref 6.5–8.1)

## 2019-09-17 MED ORDER — ZOLEDRONIC ACID 5 MG/100ML IV SOLN
5.0000 mg | Freq: Once | INTRAVENOUS | Status: AC
  Administered 2019-09-17: 12:00:00 5 mg via INTRAVENOUS
  Filled 2019-09-17: qty 100

## 2019-09-17 MED ORDER — SODIUM CHLORIDE 0.9 % IV SOLN
Freq: Once | INTRAVENOUS | Status: AC
  Administered 2019-09-17: 12:00:00 via INTRAVENOUS
  Filled 2019-09-17: qty 250

## 2019-12-10 ENCOUNTER — Other Ambulatory Visit: Payer: Self-pay | Admitting: Internal Medicine

## 2019-12-10 DIAGNOSIS — Z170 Estrogen receptor positive status [ER+]: Secondary | ICD-10-CM

## 2019-12-10 DIAGNOSIS — C50211 Malignant neoplasm of upper-inner quadrant of right female breast: Secondary | ICD-10-CM

## 2020-02-13 ENCOUNTER — Encounter: Payer: Self-pay | Admitting: *Deleted

## 2020-02-13 ENCOUNTER — Other Ambulatory Visit: Payer: Self-pay

## 2020-02-13 ENCOUNTER — Inpatient Hospital Stay: Payer: 59

## 2020-02-13 ENCOUNTER — Other Ambulatory Visit: Payer: Self-pay | Admitting: *Deleted

## 2020-02-13 ENCOUNTER — Inpatient Hospital Stay: Payer: 59 | Attending: Internal Medicine | Admitting: Internal Medicine

## 2020-02-13 DIAGNOSIS — Z923 Personal history of irradiation: Secondary | ICD-10-CM | POA: Insufficient documentation

## 2020-02-13 DIAGNOSIS — Z170 Estrogen receptor positive status [ER+]: Secondary | ICD-10-CM | POA: Insufficient documentation

## 2020-02-13 DIAGNOSIS — C50211 Malignant neoplasm of upper-inner quadrant of right female breast: Secondary | ICD-10-CM | POA: Diagnosis not present

## 2020-02-13 DIAGNOSIS — Z79811 Long term (current) use of aromatase inhibitors: Secondary | ICD-10-CM | POA: Diagnosis not present

## 2020-02-13 DIAGNOSIS — M791 Myalgia, unspecified site: Secondary | ICD-10-CM | POA: Diagnosis not present

## 2020-02-13 DIAGNOSIS — F17210 Nicotine dependence, cigarettes, uncomplicated: Secondary | ICD-10-CM | POA: Diagnosis not present

## 2020-02-13 DIAGNOSIS — Z90722 Acquired absence of ovaries, bilateral: Secondary | ICD-10-CM | POA: Diagnosis not present

## 2020-02-13 DIAGNOSIS — M810 Age-related osteoporosis without current pathological fracture: Secondary | ICD-10-CM | POA: Insufficient documentation

## 2020-02-13 DIAGNOSIS — M2550 Pain in unspecified joint: Secondary | ICD-10-CM | POA: Diagnosis not present

## 2020-02-13 DIAGNOSIS — Z803 Family history of malignant neoplasm of breast: Secondary | ICD-10-CM | POA: Diagnosis not present

## 2020-02-13 DIAGNOSIS — Z006 Encounter for examination for normal comparison and control in clinical research program: Secondary | ICD-10-CM

## 2020-02-13 DIAGNOSIS — Z90710 Acquired absence of both cervix and uterus: Secondary | ICD-10-CM | POA: Diagnosis not present

## 2020-02-13 DIAGNOSIS — Z791 Long term (current) use of non-steroidal anti-inflammatories (NSAID): Secondary | ICD-10-CM | POA: Diagnosis not present

## 2020-02-13 DIAGNOSIS — Z9079 Acquired absence of other genital organ(s): Secondary | ICD-10-CM | POA: Insufficient documentation

## 2020-02-13 DIAGNOSIS — Z79899 Other long term (current) drug therapy: Secondary | ICD-10-CM | POA: Insufficient documentation

## 2020-02-13 DIAGNOSIS — R232 Flushing: Secondary | ICD-10-CM | POA: Diagnosis not present

## 2020-02-13 LAB — CBC WITH DIFFERENTIAL/PLATELET
Abs Immature Granulocytes: 0.04 10*3/uL (ref 0.00–0.07)
Basophils Absolute: 0.1 10*3/uL (ref 0.0–0.1)
Basophils Relative: 1 %
Eosinophils Absolute: 0.1 10*3/uL (ref 0.0–0.5)
Eosinophils Relative: 2 %
HCT: 43.7 % (ref 36.0–46.0)
Hemoglobin: 14 g/dL (ref 12.0–15.0)
Immature Granulocytes: 0 %
Lymphocytes Relative: 24 %
Lymphs Abs: 2.3 10*3/uL (ref 0.7–4.0)
MCH: 29.8 pg (ref 26.0–34.0)
MCHC: 32 g/dL (ref 30.0–36.0)
MCV: 93 fL (ref 80.0–100.0)
Monocytes Absolute: 0.6 10*3/uL (ref 0.1–1.0)
Monocytes Relative: 6 %
Neutro Abs: 6.4 10*3/uL (ref 1.7–7.7)
Neutrophils Relative %: 67 %
Platelets: 255 10*3/uL (ref 150–400)
RBC: 4.7 MIL/uL (ref 3.87–5.11)
RDW: 14.8 % (ref 11.5–15.5)
WBC: 9.5 10*3/uL (ref 4.0–10.5)
nRBC: 0 % (ref 0.0–0.2)

## 2020-02-13 LAB — COMPREHENSIVE METABOLIC PANEL
ALT: 46 U/L — ABNORMAL HIGH (ref 0–44)
AST: 34 U/L (ref 15–41)
Albumin: 4.2 g/dL (ref 3.5–5.0)
Alkaline Phosphatase: 76 U/L (ref 38–126)
Anion gap: 9 (ref 5–15)
BUN: 18 mg/dL (ref 6–20)
CO2: 24 mmol/L (ref 22–32)
Calcium: 10 mg/dL (ref 8.9–10.3)
Chloride: 105 mmol/L (ref 98–111)
Creatinine, Ser: 0.53 mg/dL (ref 0.44–1.00)
GFR calc Af Amer: >60 mL/min (ref >60)
GFR calc non Af Amer: >60 mL/min (ref >60)
Glucose, Bld: 120 mg/dL — ABNORMAL HIGH (ref 70–99)
Potassium: 4.2 mmol/L (ref 3.5–5.1)
Sodium: 138 mmol/L (ref 135–145)
Total Bilirubin: 0.5 mg/dL (ref 0.3–1.2)
Total Protein: 7.8 g/dL (ref 6.5–8.1)

## 2020-02-13 NOTE — Research (Signed)
Paula [name] returns to clinic today for her 48 month follow up visit on the AFT-05 PALLAS protocol. She reports continuing to take her Arimidex as ordered, and states she will complete her 5 years of therapy in October this year. [name] states she has joined a new support group called [employer] for diabetics, and started on a Keto diet this week. 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 Patient continues to have some swelling of her lower extremities and hands, joint pain, sore throat at times, but reports her joint and back pain has improved significantly since she is now in a new job that allow her to sit down more rather than standing on a concrete floor all day. She denies any new anticancer medications at this time. She will have her mammogram prior to her next visit in August. She will also have another Reclast infusion at that time since she was diagnosed with osteoporosis. She does report taking Calcium and vitamin D supplements. Dr. Rogue Bussing was in to review labs and examine patient. He did not make any changes to her medications at this time, and states she will have another bone density test at some point after she completes the Arimidex. Patient was informed that there will be a new Informed Consent form for her to review and sign with her next visit. We have not yet received the protocol amendment and are not able to obtain the re-consent at this visit since we cannot provide a rationale for the protocol changes at this time. Patient was informed that the study is collecting COVID-19 data now - along with the previous study data that we report. Ongoing adverse events are as documented below:  Study/Protocol: AFT-05 PALLAS Cycle: 48 Month Follow Up Visit Event Grade Onset Date Resolved Date Drug Name Attribution Treatment Comments   Joint pain Grade 1 09/2015  Arimidex Possibly   Generally improved, Rt knee worse improved with new job - no longer standing all day  Hot flashes Grade 1 09/2015  Arimidex Probably  Pt declined tx  improved  Night sweats Grade 1 09/2015  Arimidex Probably  Pt declined tx  improved  Edema in BLE Grade 1 Reported 06/30/16  Arimidex Possible  Swelling in BLE  Trace edema today - Rt > Lt  Hyperglycemia Grade 1 11/16/17  Arimidex Unrelated   Started Keto diet this week; joined [employer] support group for diabetics  Osteoporosis Grade  2 08/15/19  Armidex Possible  New diagnosis Dr. Rogue Bussing ordered reclast at patient request.   Sleep apnea "severe" Grade 3 11/07/17  Arimidex Unrelated  Now using Bi-pap to manage Sleep study performed 11/07/17  Paula [name], BSN, MHA, OCN 02/13/2020 3:20 PM

## 2020-02-13 NOTE — Progress Notes (Signed)
[place] OFFICE PROGRESS NOTE  Patient Care Team: [place], [facility] as PCP - General Byrnett, Forest Gleason, MD (General Surgery) [place], Titus Mould, DO as Referring Physician [facility] Medicine)  Cancer Staging Carcinoma of upper-inner quadrant of right breast in female, estrogen receptor positive [facility]) Staging form: Breast, AJCC 7th Edition - Clinical: Stage IIA (T1c, N1, M0) - Signed by Forest Gleason, MD on 08/16/2015 - Pathologic: No stage assigned - Unsigned    Oncology History Overview Note  # 2016- BREAST CANCER  STAGE II T1 cN1 M0 [s/p Lumpec & RT]ER/PR- Pos; HER-2/neu receptor negative. Mammaprint- Low risk; no chemo; Oct 2016- START TAM; Rogene [place] study/control arm.   # MARCH 2018- discon Tam [sec to weight gain]; start Arimidex  # Dec 2016-  TAH + BSO   # AUG 2020- OSTEOPOROSIS- Reclast q 12   Carcinoma of upper-inner quadrant of right breast in female, estrogen receptor positive ([place])     INTERVAL HISTORY:  Paula [name] 54 y.o.  female pleasant patient above history of stage II ER PR positive breast cancer currently on Arimidex is here for follow-up.   Continues to chronic intermittent joint pains/hot flashes not any worse.  Bone pain improved since finding a new job.  No new shortness of breath or cough.   Patient has been trying to lose weight/keto diet.  She seems motivated.  Review of Systems  Constitutional: Negative for chills, diaphoresis, fever, malaise/fatigue and weight loss.  HENT: Negative for nosebleeds and sore throat.   Eyes: Negative for double vision.  Respiratory: Negative for cough, hemoptysis, sputum production, shortness of breath and wheezing.   Cardiovascular: Negative for chest pain, palpitations, orthopnea and leg swelling.  Gastrointestinal: Negative for abdominal pain, blood in stool, constipation, diarrhea, heartburn, melena, nausea and vomiting.  Genitourinary: Negative for dysuria, frequency and urgency.   Musculoskeletal: Positive for back pain and joint pain.  Skin: Negative.  Negative for itching and rash.  Neurological: Negative for dizziness, tingling, focal weakness, weakness and headaches.  Endo/Heme/Allergies: Does not bruise/bleed easily.  Psychiatric/Behavioral: Negative for depression. The patient is not nervous/anxious and does not have insomnia.       PAST MEDICAL HISTORY :  Past Medical History:  Diagnosis Date  . BRCA negative 2016  . Breast cancer ([place]) 05/28/15   pT1c,N1a:2/3 SLN positive for macrometastisi,ER positive, PR positive, HER-2/neu not overexpressed c Radiation  . Dog bite of left lower leg 03/07/15  . HPV in female 2016  . Personal history of radiation therapy 2016   BREAST CA  . Pneumonia 02/2015  . Sleep apnea   . STD (female)     PAST SURGICAL HISTORY :   Past Surgical History:  Procedure Laterality Date  . ABDOMINAL HYSTERECTOMY  12/01/2015   Procedure: total abdominal hysterectomty, bilateral salpingo- oopharectomy, cystoscopy ;  Surgeon: Benjaman Kindler, MD;  Location: [facility] ORS;  Service: Gynecology;;  . BREAST BIOPSY Right 6.7.16   INVASIVE MAMMARY CARCINOMA.   Marland Kitchen BREAST LUMPECTOMY Right 2016   BREAST CA  . BREAST LUMPECTOMY WITH SENTINEL LYMPH NODE BIOPSY Right 07/17/2015   Procedure: RIGHT BREAST WIDE EXCISION WITH SENTINEL NODE BIOPSY;  Surgeon: Robert Bellow, MD;  Location: [facility] ORS;  Service: General;  Laterality: Right;  . BREAST SURGERY Right 05/28/15  . COLPOSCOPY  05/28/15   High grade squamous intraepithelial lesion  . LAPAROSCOPIC VAGINAL HYSTERECTOMY WITH SALPINGO OOPHORECTOMY Bilateral 12/01/2015   Procedure: LAPAROSCOPIC ASSISTED VAGINAL HYSTERECTOMY WITH SALPINGO OOPHORECTOMY-converted to;  Surgeon: Benjaman Kindler, MD;  Location: [facility] ORS;  Service: Gynecology;  Laterality: Bilateral;  . WISDOM TOOTH EXTRACTION      FAMILY HISTORY :   Family History  Problem Relation Age of Onset  . Cancer Maternal Aunt 50        breast  . Breast cancer Maternal Aunt     SOCIAL HISTORY:   Social History   Tobacco Use  . Smoking status: Current Every Day Smoker    Packs/day: 1.00    Years: 36.00    Pack years: 36.00    Types: Cigarettes  . Smokeless tobacco: Never Used  Substance Use Topics  . Alcohol use: Yes    Alcohol/week: 0.0 standard drinks    Comment: occ  . Drug use: No    ALLERGIES:  has No Known Allergies.  MEDICATIONS:  Current Outpatient Medications  Medication Sig Dispense Refill  . anastrozole (ARIMIDEX) 1 MG tablet TAKE 1 TABLET BY MOUTH EVERY DAY 90 tablet 3  . aspirin 81 MG chewable tablet Chew 81 mg by mouth daily.    . Calcium-Vitamin D-Vitamin K (VIACTIV PO) Take 1 tablet by mouth daily.    . Cholecalciferol (VITAMIN D3) 2000 units TABS Take 1 tablet by mouth daily.    Marland Kitchen ibuprofen (ADVIL,MOTRIN) 600 MG tablet Take 1 tablet (600 mg total) by mouth every 6 (six) hours as needed (mild pain). 30 tablet 0  . Magnesium Cl-Calcium Carbonate (SLOW-MAG PO) Take 1 tablet by mouth daily.    . Multiple Vitamins-Minerals (MULTIVITAMIN ADULT) CHEW Chew 2 Doses by mouth daily.    . naproxen sodium (ANAPROX) 220 MG tablet Take 220 mg by mouth 2 (two) times daily with a meal.    . Vitamin D, Ergocalciferol, (DRISDOL) 1.25 MG (50000 UT) CAPS capsule TAKE 1 CAPSULE (50,000 UNITS TOTAL) BY MOUTH EVERY 7 (SEVEN) DAYS. 12 capsule 1   No current facility-administered medications for this visit.    PHYSICAL EXAMINATION: ECOG PERFORMANCE STATUS: 0 - Asymptomatic  BP (!) 149/73 (Patient Position: Sitting)   Pulse 84   Temp 98.7 F (37.1 C) (Tympanic)   Resp 20   Wt 243 lb (110.2 kg)   LMP 09/23/2015   BMI 43.05 kg/m   Filed Weights   02/13/20 1418  Weight: 243 lb (110.2 kg)    Physical Exam  Constitutional: She is oriented to person, place, and time and well-developed, well-nourished, and in no distress.  HENT:  Head: Normocephalic and atraumatic.  Mouth/Throat: Oropharynx is clear and  moist. No oropharyngeal exudate.  Eyes: Pupils are equal, round, and reactive to light.  Cardiovascular: Normal rate and regular rhythm.  Pulmonary/Chest: No respiratory distress. She has no wheezes.  Abdominal: Soft. Bowel sounds are normal. She exhibits no distension and no mass. There is no abdominal tenderness. There is no rebound and no guarding.  Musculoskeletal:        General: No tenderness or edema. Normal range of motion.     Cervical back: Normal range of motion and neck supple.  Neurological: She is alert and oriented to person, place, and time.  Skin: Skin is warm.     Psychiatric: Affect normal.      LABORATORY DATA:  I have reviewed the data as listed    Component Value Date/Time   NA 138 02/13/2020 1354   K 4.2 02/13/2020 1354   CL 105 02/13/2020 1354   CO2 24 02/13/2020 1354   GLUCOSE 120 (H) 02/13/2020 1354   BUN 18 02/13/2020 1354   CREATININE 0.53 02/13/2020 1354  CALCIUM 10.0 02/13/2020 1354   PROT 7.8 02/13/2020 1354   ALBUMIN 4.2 02/13/2020 1354   AST 34 02/13/2020 1354   ALT 46 (H) 02/13/2020 1354   ALKPHOS 76 02/13/2020 1354   BILITOT 0.5 02/13/2020 1354   GFRNONAA >60 02/13/2020 1354   GFRAA >60 02/13/2020 1354    No results found for: SPEP, UPEP  Lab Results  Component Value Date   WBC 9.5 02/13/2020   NEUTROABS 6.4 02/13/2020   HGB 14.0 02/13/2020   HCT 43.7 02/13/2020   MCV 93.0 02/13/2020   PLT 255 02/13/2020      Chemistry      Component Value Date/Time   NA 138 02/13/2020 1354   K 4.2 02/13/2020 1354   CL 105 02/13/2020 1354   CO2 24 02/13/2020 1354   BUN 18 02/13/2020 1354   CREATININE 0.53 02/13/2020 1354      Component Value Date/Time   CALCIUM 10.0 02/13/2020 1354   ALKPHOS 76 02/13/2020 1354   AST 34 02/13/2020 1354   ALT 46 (H) 02/13/2020 1354   BILITOT 0.5 02/13/2020 1354       RADIOGRAPHIC STUDIES: I have personally reviewed the radiological images as listed and agreed with the findings in the report. No  results found.   ASSESSMENT & PLAN:  Carcinoma of upper-inner quadrant of right breast in female, estrogen receptor positive ([place]) # Right Stage II Breast ca; ER/PR positive HER-2/neu negative  Currently on Anastrzole [BSO]/control arm of [place] study.  Stable  # Continue anastrazole; no evidence of disease [fall of 2021].  Mammogram-in aug 2020- wnl.  Discussed that would recommend anastrozole for total of 5 years given low risk MammaPrint.  Patient will finish AI October 2021.  #Myalgia/arthritic pain likely from Arimidex.  Continue calcium plus vitamin D plus Osteo Bi-Flex.  Stable  # # OSTEOPOROSIS-BMD August 2020-  Reclast 5 mg q 12 month.  Reclast next visit.   #Weight loss-patient is interested in weight loss/diet modification.  Encouraged the patient to continue to lose weight/exercise-which is going to help her general health/also reduce the risk of breast cancer.  # DISPOSITION:   # follow up in 6 months-MD: /labscbc/cmp/ca-27-29; reclast -Dr.B   Orders Placed This Encounter  Procedures  . Comprehensive metabolic panel    Standing Status:   Future    Standing Expiration Date:   02/12/2021  . CBC with Differential    Standing Status:   Future    Standing Expiration Date:   02/12/2021  . Cancer antigen 27.29    Standing Status:   Future    Standing Expiration Date:   02/12/2021   All questions were answered. The patient knows to call the clinic with any problems, questions or concerns.      Cammie Sickle, MD 02/14/2020 7:27 AM

## 2020-02-13 NOTE — Assessment & Plan Note (Addendum)
#  Right Stage II Breast ca; ER/PR positive HER-2/neu negative  Currently on Anastrzole [BSO]/control arm of [place] study.  Stable  # Continue anastrazole; no evidence of disease [fall of 2021].  Mammogram-in aug 2020- wnl.  Discussed that would recommend anastrozole for total of 5 years given low risk MammaPrint.  Patient will finish AI October 2021.  #Myalgia/arthritic pain likely from Arimidex.  Continue calcium plus vitamin D plus Osteo Bi-Flex.  Stable  # # OSTEOPOROSIS-BMD August 2020-  Reclast 5 mg q 12 month.  Reclast next visit.   #Weight loss-patient is interested in weight loss/diet modification.  Encouraged the patient to continue to lose weight/exercise-which is going to help her general health/also reduce the risk of breast cancer.  # DISPOSITION:   # follow up in 6 months-MD: /labscbc/cmp/ca-27-29; reclast -Dr.B

## 2020-02-14 LAB — CANCER ANTIGEN 27.29: CA 27.29: 7.4 U/mL (ref 0.0–38.6)

## 2020-03-29 ENCOUNTER — Ambulatory Visit: Payer: 59 | Attending: Internal Medicine

## 2020-03-29 DIAGNOSIS — Z23 Encounter for immunization: Secondary | ICD-10-CM

## 2020-03-29 NOTE — Progress Notes (Signed)
   Covid-19 Vaccination Clinic  Name:  Paula [name]    MRN: [id number] DOB: 13-Apr-1966  03/29/2020  [name] was observed post Covid-19 immunization for 15 minutes without incident. She was provided with Vaccine Information Sheet and instruction to access the V-Safe system.   [name] was instructed to call 911 with any severe reactions post vaccine: Marland Kitchen Difficulty breathing  . Swelling of face and throat  . A fast heartbeat  . A bad rash all over body  . Dizziness and weakness   Immunizations Administered    Name Date Dose VIS Date Route   Pfizer COVID-19 Vaccine 03/29/2020  9:03 AM 0.3 mL 11/30/2019 Intramuscular   Manufacturer: [place]   Lot: [id number]   [place]: [id number]

## 2020-04-29 ENCOUNTER — Ambulatory Visit: Payer: 59 | Attending: Internal Medicine

## 2020-04-29 DIAGNOSIS — Z23 Encounter for immunization: Secondary | ICD-10-CM

## 2020-04-29 NOTE — Progress Notes (Signed)
   Covid-19 Vaccination Clinic  Name:  Paula [name]    MRN: [id number] DOB: October 14, 1966  04/29/2020  [name] was observed post Covid-19 immunization for 15 minutes without incident. She was provided with Vaccine Information Sheet and instruction to access the V-Safe system.   [name] was instructed to call 911 with any severe reactions post vaccine: Marland Kitchen Difficulty breathing  . Swelling of face and throat  . A fast heartbeat  . A bad rash all over body  . Dizziness and weakness   Immunizations Administered    Name Date Dose VIS Date Route   Pfizer COVID-19 Vaccine 04/29/2020  8:12 AM 0.3 mL 02/13/2019 Intramuscular   Manufacturer: [place]   Lot: [id number]   [place]: [id number]

## 2020-08-12 ENCOUNTER — Other Ambulatory Visit: Payer: No Typology Code available for payment source

## 2020-08-12 ENCOUNTER — Ambulatory Visit: Payer: No Typology Code available for payment source

## 2020-08-12 ENCOUNTER — Ambulatory Visit: Payer: No Typology Code available for payment source | Admitting: Internal Medicine

## 2020-08-15 ENCOUNTER — Encounter: Payer: Self-pay | Admitting: Internal Medicine

## 2020-08-15 ENCOUNTER — Inpatient Hospital Stay: Payer: No Typology Code available for payment source

## 2020-08-15 ENCOUNTER — Inpatient Hospital Stay: Payer: No Typology Code available for payment source | Attending: Internal Medicine

## 2020-08-15 ENCOUNTER — Inpatient Hospital Stay (HOSPITAL_BASED_OUTPATIENT_CLINIC_OR_DEPARTMENT_OTHER): Payer: No Typology Code available for payment source | Admitting: Internal Medicine

## 2020-08-15 ENCOUNTER — Ambulatory Visit
Admission: RE | Admit: 2020-08-15 | Discharge: 2020-08-15 | Disposition: A | Discharge status: Home / Self Care | Payer: No Typology Code available for payment source | Source: Ambulatory Visit | Attending: Internal Medicine | Admitting: Internal Medicine

## 2020-08-15 ENCOUNTER — Other Ambulatory Visit: Payer: Self-pay

## 2020-08-15 DIAGNOSIS — Z9079 Acquired absence of other genital organ(s): Secondary | ICD-10-CM | POA: Diagnosis not present

## 2020-08-15 DIAGNOSIS — F17210 Nicotine dependence, cigarettes, uncomplicated: Secondary | ICD-10-CM | POA: Insufficient documentation

## 2020-08-15 DIAGNOSIS — Z79811 Long term (current) use of aromatase inhibitors: Secondary | ICD-10-CM | POA: Insufficient documentation

## 2020-08-15 DIAGNOSIS — Z90710 Acquired absence of both cervix and uterus: Secondary | ICD-10-CM | POA: Diagnosis not present

## 2020-08-15 DIAGNOSIS — C50211 Malignant neoplasm of upper-inner quadrant of right female breast: Secondary | ICD-10-CM | POA: Insufficient documentation

## 2020-08-15 DIAGNOSIS — Z7982 Long term (current) use of aspirin: Secondary | ICD-10-CM | POA: Diagnosis not present

## 2020-08-15 DIAGNOSIS — G4730 Sleep apnea, unspecified: Secondary | ICD-10-CM | POA: Diagnosis not present

## 2020-08-15 DIAGNOSIS — Z90722 Acquired absence of ovaries, bilateral: Secondary | ICD-10-CM | POA: Insufficient documentation

## 2020-08-15 DIAGNOSIS — Z006 Encounter for examination for normal comparison and control in clinical research program: Secondary | ICD-10-CM | POA: Diagnosis not present

## 2020-08-15 DIAGNOSIS — Z803 Family history of malignant neoplasm of breast: Secondary | ICD-10-CM | POA: Diagnosis not present

## 2020-08-15 DIAGNOSIS — Z170 Estrogen receptor positive status [ER+]: Secondary | ICD-10-CM | POA: Insufficient documentation

## 2020-08-15 DIAGNOSIS — Z79899 Other long term (current) drug therapy: Secondary | ICD-10-CM | POA: Insufficient documentation

## 2020-08-15 DIAGNOSIS — Z923 Personal history of irradiation: Secondary | ICD-10-CM | POA: Diagnosis not present

## 2020-08-15 DIAGNOSIS — Z791 Long term (current) use of non-steroidal anti-inflammatories (NSAID): Secondary | ICD-10-CM | POA: Insufficient documentation

## 2020-08-15 LAB — CBC WITH DIFFERENTIAL/PLATELET
Abs Immature Granulocytes: 0.03 10*3/uL (ref 0.00–0.07)
Basophils Absolute: 0.1 10*3/uL (ref 0.0–0.1)
Basophils Relative: 1 %
Eosinophils Absolute: 0.2 10*3/uL (ref 0.0–0.5)
Eosinophils Relative: 2 %
HCT: 42.4 % (ref 36.0–46.0)
Hemoglobin: 14.2 g/dL (ref 12.0–15.0)
Immature Granulocytes: 0 %
Lymphocytes Relative: 21 %
Lymphs Abs: 2.1 10*3/uL (ref 0.7–4.0)
MCH: 30.2 pg (ref 26.0–34.0)
MCHC: 33.5 g/dL (ref 30.0–36.0)
MCV: 90.2 fL (ref 80.0–100.0)
Monocytes Absolute: 0.7 10*3/uL (ref 0.1–1.0)
Monocytes Relative: 7 %
Neutro Abs: 7 10*3/uL (ref 1.7–7.7)
Neutrophils Relative %: 69 %
Platelets: 245 10*3/uL (ref 150–400)
RBC: 4.7 MIL/uL (ref 3.87–5.11)
RDW: 15.5 % (ref 11.5–15.5)
WBC: 10 10*3/uL (ref 4.0–10.5)
nRBC: 0 % (ref 0.0–0.2)

## 2020-08-15 LAB — COMPREHENSIVE METABOLIC PANEL
ALT: 33 U/L (ref 0–44)
AST: 22 U/L (ref 15–41)
Albumin: 4.2 g/dL (ref 3.5–5.0)
Alkaline Phosphatase: 72 U/L (ref 38–126)
Anion gap: 9 (ref 5–15)
BUN: 13 mg/dL (ref 6–20)
CO2: 26 mmol/L (ref 22–32)
Calcium: 8.9 mg/dL (ref 8.9–10.3)
Chloride: 104 mmol/L (ref 98–111)
Creatinine, Ser: 0.65 mg/dL (ref 0.44–1.00)
GFR calc Af Amer: >60 mL/min (ref >60)
GFR calc non Af Amer: >60 mL/min (ref >60)
Glucose, Bld: 113 mg/dL — ABNORMAL HIGH (ref 70–99)
Potassium: 4.1 mmol/L (ref 3.5–5.1)
Sodium: 139 mmol/L (ref 135–145)
Total Bilirubin: 0.5 mg/dL (ref 0.3–1.2)
Total Protein: 7.5 g/dL (ref 6.5–8.1)

## 2020-08-15 NOTE — Assessment & Plan Note (Signed)
#  Right Stage II Breast ca; ER/PR positive HER-2/neu negative  Currently on Anastrzole [BSO]/control arm of [place] study.  STABLE.   # Continue anastrazole; no evidence of disease ;  Mammogram-in aug 2021- wnl. Patient will finish AI x5 years October 2021 low risk-mammaprint[.  #Myalgia/arthritic pain likely from Arimidex.  Continue calcium plus vitamin D plus Osteo Bi-Flex. STABLE.   # # OSTEOPOROSIS-BMD August 2020-  Reclast 5 mg q 12 month.  Reclast today.   #Intentional weight loss: Congratulated the patient on her weight loss/diet modification.  # DISPOSITION:   # Reclast today # follow up in 6 months-MD: /labscbc/cmp/ca-27-29; reclast -Dr.B  Addendum: Patient Reclast cannot be given today-as 1 month earlier; will reschedule in 1 month.

## 2020-08-15 NOTE — Research (Signed)
[place] Re-Consent Visit:  Patient in to the cancer center for her scheduled appointment with Dr. Rogue Bussing today. She denies any complaints at this time. She states she was able to read over the entire [place] ICF prior to coming in today. The revised [place] ICF version 8 dated 02/06/2020 was presented to the patient in it's entirety today. The protocol treatment, potential benefits and risks, alternative treatments and side effects were explained to the patient. The patient verbalized her understanding of the protocol at this time, she denied any questions. The patient is aware that her participation is strictly voluntary and she can withdraw consent at any time if she chooses with out any change in her current care provided. The patient knows she will have a visit in February for her 60 month follow up for the study and then her visits will be annual after that. She was concerned that her insurance wouldn't cover the cost of increased follow up moving forward. The patient was given a signed copy of the consent for her reference. Approximately 30 minutes was spent in consultation with the patient in review of the revised consent. The patient was thanked for her participation in research and encouraged to call if she has any questions or concerns. Jeral Fruit, RN, BSN, OCN Date: 08/15/2020 Time: 1500

## 2020-08-15 NOTE — Progress Notes (Signed)
[name] OFFICE PROGRESS NOTE  Patient Care Team: [place], [facility] as PCP - General Byrnett, Forest Gleason, MD (General Surgery) [place], Titus Mould, DO as Referring Physician [facility] Medicine)  Cancer Staging Carcinoma of upper-inner quadrant of right breast in female, estrogen receptor positive [facility]) Staging form: Breast, AJCC 7th Edition - Clinical: Stage IIA (T1c, N1, M0) - Signed by Forest Gleason, MD on 08/16/2015 - Pathologic: No stage assigned - Unsigned    Oncology History Overview Note  # 2016- BREAST CANCER  STAGE II T1 cN1 M0 [s/p Lumpec & RT]ER/PR- Pos; HER-2/neu receptor negative. Mammaprint- Low risk; no chemo; Oct 2016- START TAM; [place]ogene [place] study/control arm.   # MARCH 2018- discon Tam [sec to weight gain]; start Arimidex  # Dec 2016-  TAH + BSO   # AUG 2020- OSTEOPOROSIS- Reclast q 12   Carcinoma of upper-inner quadrant of right breast in female, estrogen receptor positive ([place])     INTERVAL HISTORY:  Paula [name] 54 y.o.  female pleasant patient above history of stage II ER PR positive breast cancer currently on Arimidex is here for follow-up.  Patient denies any blood in stool or black or stool.  Denies any new shortness of breath or cough.  Patient has lost weight which is intentional.  She feels overall improved general health.    Review of Systems  Constitutional: Negative for chills, diaphoresis, fever, malaise/fatigue and weight loss.  HENT: Negative for nosebleeds and sore throat.   Eyes: Negative for double vision.  Respiratory: Negative for cough, hemoptysis, sputum production, shortness of breath and wheezing.   Cardiovascular: Negative for chest pain, palpitations, orthopnea and leg swelling.  Gastrointestinal: Negative for abdominal pain, blood in stool, constipation, diarrhea, heartburn, melena, nausea and vomiting.  Genitourinary: Negative for dysuria, frequency and urgency.  Musculoskeletal: Positive for back  pain and joint pain.  Skin: Negative.  Negative for itching and rash.  Neurological: Negative for dizziness, tingling, focal weakness, weakness and headaches.  Endo/Heme/Allergies: Does not bruise/bleed easily.  Psychiatric/Behavioral: Negative for depression. The patient is not nervous/anxious and does not have insomnia.       PAST MEDICAL HISTORY :  Past Medical History:  Diagnosis Date  . BRCA negative 2016  . Breast cancer ([place]) 05/28/15   pT1c,N1a:2/3 SLN positive for macrometastisi,ER positive, PR positive, HER-2/neu not overexpressed c Radiation  . Dog bite of left lower leg 03/07/15  . HPV in female 2016  . Personal history of radiation therapy 2016   BREAST CA  . Pneumonia 02/2015  . Sleep apnea   . STD (female)     PAST SURGICAL HISTORY :   Past Surgical History:  Procedure Laterality Date  . ABDOMINAL HYSTERECTOMY  12/01/2015   Procedure: total abdominal hysterectomty, bilateral salpingo- oopharectomy, cystoscopy ;  Surgeon: Benjaman Kindler, MD;  Location: [facility] ORS;  Service: Gynecology;;  . BREAST BIOPSY Right 6.7.16   INVASIVE MAMMARY CARCINOMA.   Marland Kitchen BREAST LUMPECTOMY Right 2016   BREAST CA  . BREAST LUMPECTOMY WITH SENTINEL LYMPH NODE BIOPSY Right 07/17/2015   Procedure: RIGHT BREAST WIDE EXCISION WITH SENTINEL NODE BIOPSY;  Surgeon: Robert Bellow, MD;  Location: [facility] ORS;  Service: General;  Laterality: Right;  . BREAST SURGERY Right 05/28/15  . COLPOSCOPY  05/28/15   High grade squamous intraepithelial lesion  . LAPAROSCOPIC VAGINAL HYSTERECTOMY WITH SALPINGO OOPHORECTOMY Bilateral 12/01/2015   Procedure: LAPAROSCOPIC ASSISTED VAGINAL HYSTERECTOMY WITH SALPINGO OOPHORECTOMY-converted to;  Surgeon: Benjaman Kindler, MD;  Location: [facility] ORS;  Service:  Gynecology;  Laterality: Bilateral;  . WISDOM TOOTH EXTRACTION      FAMILY HISTORY :   Family History  Problem Relation Age of Onset  . Cancer Maternal Aunt 50       breast  . Breast cancer Maternal Aunt      SOCIAL HISTORY:   Social History   Tobacco Use  . Smoking status: Current Every Day Smoker    Packs/day: 1.00    Years: 36.00    Pack years: 36.00    Types: Cigarettes  . Smokeless tobacco: Never Used  Substance Use Topics  . Alcohol use: Yes    Alcohol/week: 0.0 standard drinks    Comment: occ  . Drug use: No    ALLERGIES:  has No Known Allergies.  MEDICATIONS:  Current Outpatient Medications  Medication Sig Dispense Refill  . anastrozole (ARIMIDEX) 1 MG tablet TAKE 1 TABLET BY MOUTH EVERY DAY 90 tablet 3  . aspirin 81 MG chewable tablet Chew 81 mg by mouth daily.    . Calcium-Vitamin D-Vitamin K (VIACTIV PO) Take 1 tablet by mouth daily.    . Cholecalciferol (VITAMIN D3) 2000 units TABS Take 1 tablet by mouth daily.    Marland Kitchen ibuprofen (ADVIL,MOTRIN) 600 MG tablet Take 1 tablet (600 mg total) by mouth every 6 (six) hours as needed (mild pain). 30 tablet 0  . Multiple Vitamins-Minerals (MULTIVITAMIN ADULT) CHEW Chew 2 Doses by mouth daily.    . naproxen sodium (ANAPROX) 220 MG tablet Take 220 mg by mouth 2 (two) times daily with a meal.    . Vitamin D, Ergocalciferol, (DRISDOL) 1.25 MG (50000 UT) CAPS capsule TAKE 1 CAPSULE (50,000 UNITS TOTAL) BY MOUTH EVERY 7 (SEVEN) DAYS. 12 capsule 1   No current facility-administered medications for this visit.    PHYSICAL EXAMINATION: ECOG PERFORMANCE STATUS: 0 - Asymptomatic  BP 126/69 (BP Location: Left Arm, Patient Position: Sitting, Cuff Size: Large)   Pulse 80   Temp 99.5 F (37.5 C) (Tympanic)   Resp 20   Ht $R'5\' 3"'EY$  (1.6 m)   Wt 214 lb (97.1 kg)   LMP 09/23/2015   SpO2 96%   BMI 37.91 kg/m   Filed Weights   08/15/20 1426  Weight: 214 lb (97.1 kg)    Physical Exam HENT:     Head: Normocephalic and atraumatic.     Mouth/Throat:     Pharynx: No oropharyngeal exudate.  Eyes:     Pupils: Pupils are equal, round, and reactive to light.  Cardiovascular:     Rate and Rhythm: Normal rate and regular rhythm.   Pulmonary:     Effort: No respiratory distress.     Breath sounds: No wheezing.  Abdominal:     General: Bowel sounds are normal. There is no distension.     Palpations: Abdomen is soft. There is no mass.     Tenderness: There is no abdominal tenderness. There is no guarding or rebound.  Musculoskeletal:        General: No tenderness. Normal range of motion.     Cervical back: Normal range of motion and neck supple.  Skin:    General: Skin is warm.     Comments:    Neurological:     Mental Status: She is alert and oriented to person, place, and time.  Psychiatric:        Mood and Affect: Affect normal.       LABORATORY DATA:  I have reviewed the data as listed    Component  Value Date/Time   NA 139 08/15/2020 1410   K 4.1 08/15/2020 1410   CL 104 08/15/2020 1410   CO2 26 08/15/2020 1410   GLUCOSE 113 (H) 08/15/2020 1410   BUN 13 08/15/2020 1410   CREATININE 0.65 08/15/2020 1410   CALCIUM 8.9 08/15/2020 1410   PROT 7.5 08/15/2020 1410   ALBUMIN 4.2 08/15/2020 1410   AST 22 08/15/2020 1410   ALT 33 08/15/2020 1410   ALKPHOS 72 08/15/2020 1410   BILITOT 0.5 08/15/2020 1410   GFRNONAA >60 08/15/2020 1410   GFRAA >60 08/15/2020 1410    No results found for: SPEP, UPEP  Lab Results  Component Value Date   WBC 10.0 08/15/2020   NEUTROABS 7.0 08/15/2020   HGB 14.2 08/15/2020   HCT 42.4 08/15/2020   MCV 90.2 08/15/2020   PLT 245 08/15/2020      Chemistry      Component Value Date/Time   NA 139 08/15/2020 1410   K 4.1 08/15/2020 1410   CL 104 08/15/2020 1410   CO2 26 08/15/2020 1410   BUN 13 08/15/2020 1410   CREATININE 0.65 08/15/2020 1410      Component Value Date/Time   CALCIUM 8.9 08/15/2020 1410   ALKPHOS 72 08/15/2020 1410   AST 22 08/15/2020 1410   ALT 33 08/15/2020 1410   BILITOT 0.5 08/15/2020 1410       RADIOGRAPHIC STUDIES: I have personally reviewed the radiological images as listed and agreed with the findings in the report. MM DIAG  BREAST TOMO BILATERAL  Result Date: 08/15/2020 CLINICAL DATA:  Status post right lumpectomy and radiation therapy for breast cancer in 2016. EXAM: DIGITAL DIAGNOSTIC BILATERAL MAMMOGRAM WITH TOMO AND CAD COMPARISON:  Previous exam(s). ACR Breast Density Category c: The breast tissue is heterogeneously dense, which may obscure small masses. FINDINGS: Stable post lumpectomy changes on the right. No interval findings suspicious for malignancy in either breast. Mammographic images were processed with CAD. IMPRESSION: No evidence of malignancy. RECOMMENDATION: Bilateral diagnostic mammogram in year. I have discussed the findings and recommendations with the patient. If applicable, a reminder letter will be sent to the patient regarding the next appointment. BI-RADS CATEGORY  2: Benign. Electronically Signed   By: Claudie Revering M.D.   On: 08/15/2020 13:24     ASSESSMENT & PLAN:  Carcinoma of upper-inner quadrant of right breast in female, estrogen receptor positive ([place]) # Right Stage II Breast ca; ER/PR positive HER-2/neu negative  Currently on Anastrzole [BSO]/control arm of [place] study.  STABLE.   # Continue anastrazole; no evidence of disease ;  Mammogram-in aug 2021- wnl. Patient will finish AI x5 years October 2021 low risk-mammaprint[.  #Myalgia/arthritic pain likely from Arimidex.  Continue calcium plus vitamin D plus Osteo Bi-Flex. STABLE.   # # OSTEOPOROSIS-BMD August 2020-  Reclast 5 mg q 12 month.  Reclast today.   #Intentional weight loss: Congratulated the patient on her weight loss/diet modification.  # DISPOSITION:   # Reclast today # follow up in 6 months-MD: /labscbc/cmp/ca-27-29; reclast -Dr.B  Addendum: Patient Reclast cannot be given today-as 1 month earlier; will reschedule in 1 month.   No orders of the defined types were placed in this encounter.  All questions were answered. The patient knows to call the clinic with any problems, questions or concerns.      Cammie Sickle, MD 08/15/2020 3:29 PM

## 2020-08-16 LAB — CANCER ANTIGEN 27.29: CA 27.29: 6.1 U/mL (ref 0.0–38.6)

## 2020-09-12 ENCOUNTER — Other Ambulatory Visit: Payer: Self-pay

## 2020-09-12 ENCOUNTER — Inpatient Hospital Stay: Payer: No Typology Code available for payment source | Attending: Internal Medicine

## 2020-09-12 VITALS — BP 129/79 | HR 79 | Temp 98.7°F | Resp 18

## 2020-09-12 DIAGNOSIS — Z170 Estrogen receptor positive status [ER+]: Secondary | ICD-10-CM | POA: Diagnosis not present

## 2020-09-12 DIAGNOSIS — C50211 Malignant neoplasm of upper-inner quadrant of right female breast: Secondary | ICD-10-CM | POA: Diagnosis present

## 2020-09-12 DIAGNOSIS — Z90710 Acquired absence of both cervix and uterus: Secondary | ICD-10-CM | POA: Insufficient documentation

## 2020-09-12 DIAGNOSIS — Z923 Personal history of irradiation: Secondary | ICD-10-CM | POA: Diagnosis not present

## 2020-09-12 DIAGNOSIS — Z9079 Acquired absence of other genital organ(s): Secondary | ICD-10-CM | POA: Insufficient documentation

## 2020-09-12 DIAGNOSIS — Z7982 Long term (current) use of aspirin: Secondary | ICD-10-CM | POA: Insufficient documentation

## 2020-09-12 DIAGNOSIS — Z90722 Acquired absence of ovaries, bilateral: Secondary | ICD-10-CM | POA: Diagnosis not present

## 2020-09-12 DIAGNOSIS — Z803 Family history of malignant neoplasm of breast: Secondary | ICD-10-CM | POA: Diagnosis not present

## 2020-09-12 DIAGNOSIS — Z79811 Long term (current) use of aromatase inhibitors: Secondary | ICD-10-CM | POA: Insufficient documentation

## 2020-09-12 DIAGNOSIS — M810 Age-related osteoporosis without current pathological fracture: Secondary | ICD-10-CM

## 2020-09-12 DIAGNOSIS — Z79899 Other long term (current) drug therapy: Secondary | ICD-10-CM | POA: Insufficient documentation

## 2020-09-12 MED ORDER — SODIUM CHLORIDE 0.9 % IV SOLN
Freq: Once | INTRAVENOUS | Status: AC
  Filled 2020-09-12: qty 250

## 2020-09-12 MED ORDER — ZOLEDRONIC ACID 5 MG/100ML IV SOLN
5.0000 mg | Freq: Once | INTRAVENOUS | Status: AC
  Administered 2020-09-12: 5 mg via INTRAVENOUS
  Filled 2020-09-12: qty 100

## 2020-09-12 NOTE — Progress Notes (Unsigned)
Last Reclast given on 09/17/19. Last labs performed on 08/15/20.  Per Dr. Rogue Bussing okay to proceed with Reclast as scheduled at this time.

## 2020-11-28 ENCOUNTER — Other Ambulatory Visit: Payer: Self-pay | Admitting: Internal Medicine

## 2020-11-28 DIAGNOSIS — C50211 Malignant neoplasm of upper-inner quadrant of right female breast: Secondary | ICD-10-CM

## 2021-02-12 ENCOUNTER — Other Ambulatory Visit: Payer: Self-pay

## 2021-02-12 DIAGNOSIS — C50211 Malignant neoplasm of upper-inner quadrant of right female breast: Secondary | ICD-10-CM

## 2021-02-13 ENCOUNTER — Inpatient Hospital Stay: Payer: No Typology Code available for payment source

## 2021-02-13 ENCOUNTER — Other Ambulatory Visit: Payer: Self-pay

## 2021-02-13 ENCOUNTER — Inpatient Hospital Stay: Payer: No Typology Code available for payment source | Attending: Internal Medicine | Admitting: Internal Medicine

## 2021-02-13 ENCOUNTER — Encounter: Payer: Self-pay | Admitting: Internal Medicine

## 2021-02-13 VITALS — BP 123/75 | HR 74 | Temp 97.7°F | Resp 20 | Ht 63.0 in | Wt 214.0 lb

## 2021-02-13 DIAGNOSIS — Z170 Estrogen receptor positive status [ER+]: Secondary | ICD-10-CM

## 2021-02-13 DIAGNOSIS — C50211 Malignant neoplasm of upper-inner quadrant of right female breast: Secondary | ICD-10-CM

## 2021-02-13 DIAGNOSIS — Z90722 Acquired absence of ovaries, bilateral: Secondary | ICD-10-CM | POA: Insufficient documentation

## 2021-02-13 DIAGNOSIS — Z90710 Acquired absence of both cervix and uterus: Secondary | ICD-10-CM | POA: Diagnosis not present

## 2021-02-13 DIAGNOSIS — M810 Age-related osteoporosis without current pathological fracture: Secondary | ICD-10-CM | POA: Diagnosis not present

## 2021-02-13 DIAGNOSIS — Z87891 Personal history of nicotine dependence: Secondary | ICD-10-CM | POA: Insufficient documentation

## 2021-02-13 DIAGNOSIS — Z006 Encounter for examination for normal comparison and control in clinical research program: Secondary | ICD-10-CM | POA: Diagnosis not present

## 2021-02-13 DIAGNOSIS — M791 Myalgia, unspecified site: Secondary | ICD-10-CM | POA: Diagnosis not present

## 2021-02-13 LAB — CBC WITH DIFFERENTIAL/PLATELET
Abs Immature Granulocytes: 0.04 10*3/uL (ref 0.00–0.07)
Basophils Absolute: 0.1 10*3/uL (ref 0.0–0.1)
Basophils Relative: 1 %
Eosinophils Absolute: 0.2 10*3/uL (ref 0.0–0.5)
Eosinophils Relative: 2 %
HCT: 42.5 % (ref 36.0–46.0)
Hemoglobin: 14 g/dL (ref 12.0–15.0)
Immature Granulocytes: 1 %
Lymphocytes Relative: 25 %
Lymphs Abs: 2.1 10*3/uL (ref 0.7–4.0)
MCH: 30.4 pg (ref 26.0–34.0)
MCHC: 32.9 g/dL (ref 30.0–36.0)
MCV: 92.2 fL (ref 80.0–100.0)
Monocytes Absolute: 0.7 10*3/uL (ref 0.1–1.0)
Monocytes Relative: 8 %
Neutro Abs: 5.4 10*3/uL (ref 1.7–7.7)
Neutrophils Relative %: 63 %
Platelets: 226 10*3/uL (ref 150–400)
RBC: 4.61 MIL/uL (ref 3.87–5.11)
RDW: 14.2 % (ref 11.5–15.5)
WBC: 8.4 10*3/uL (ref 4.0–10.5)
nRBC: 0 % (ref 0.0–0.2)

## 2021-02-13 LAB — COMPREHENSIVE METABOLIC PANEL
ALT: 37 U/L (ref 0–44)
AST: 27 U/L (ref 15–41)
Albumin: 4.2 g/dL (ref 3.5–5.0)
Alkaline Phosphatase: 63 U/L (ref 38–126)
Anion gap: 11 (ref 5–15)
BUN: 15 mg/dL (ref 6–20)
CO2: 26 mmol/L (ref 22–32)
Calcium: 9.2 mg/dL (ref 8.9–10.3)
Chloride: 103 mmol/L (ref 98–111)
Creatinine, Ser: 0.77 mg/dL (ref 0.44–1.00)
GFR, Estimated: >60 mL/min (ref >60)
Glucose, Bld: 106 mg/dL — ABNORMAL HIGH (ref 70–99)
Potassium: 4.1 mmol/L (ref 3.5–5.1)
Sodium: 140 mmol/L (ref 135–145)
Total Bilirubin: 0.3 mg/dL (ref 0.3–1.2)
Total Protein: 7.9 g/dL (ref 6.5–8.1)

## 2021-02-13 NOTE — Assessment & Plan Note (Addendum)
#  Right Stage II Breast ca; ER/PR positive HER-2/neu negative' s/p  Anastrzole [BSO]/control arm of [place] study. STABLE.   # Mammogram-in aug 2021- wnl. Patient  finished AI x5 years October 2021 low risk-mammaprint.  Follow-up on a yearly basis-moving forward.  #Myalgia/arthritic pain-slight improvement off anastrozole.  Monitor for now.  # # OSTEOPOROSIS-BMD August 2020- T score =-. 2.9; Reclast 5 mg q 12 month; in 7 months from now.  # DISPOSITION:   # mammo in aug 2022.  # follow up in 7 months-MD: /labscbc/cmp/ca-27-29; reclast; BMD prior--Dr.B

## 2021-02-13 NOTE — Progress Notes (Signed)
[place] OFFICE PROGRESS NOTE  Patient Care Team: [place], [facility] as PCP - General [place], Forest Gleason, MD (General Surgery) [place], Titus Mould, DO as Referring Physician (Family Medicine)  Cancer Staging Carcinoma of upper-inner quadrant of right breast in female, estrogen receptor positive [facility]) Staging form: Breast, AJCC 7th Edition - Clinical: Stage IIA (T1c, N1, M0) - Signed by Forest Gleason, MD on 08/16/2015 Laterality: Right Method of detection of distant metastases: Clinical Estrogen receptor status: Positive Progesterone receptor status: Positive HER2 status: Negative Multi-gene signature risk of recurrence: Low risk - Pathologic: No stage assigned - Unsigned    Oncology History Overview Note  # 2016- BREAST CANCER  STAGE II T1 cN1 M0 [s/p Lumpec & RT]ER/PR- Pos; HER-2/neu receptor negative. Mammaprint- Low risk; no chemo; Oct 2016- START TAM; [place] [place] study/control arm.   # MARCH 2018- discon Tam [sec to weight gain]; start Arimidex; stopped October 2016 [5 years]  # Dec 2016-  TAH + BSO   # AUG 2020- OSTEOPOROSIS- Reclast q 12   Carcinoma of upper-inner quadrant of right breast in female, estrogen receptor positive ([place])     INTERVAL HISTORY:  Paula [name] 55 y.o.  female pleasant patient above history of stage II ER PR positive breast cancer is here for follow-up.  Patient finished anastrozole in October 2021.  She had been on for 5 years.  Patient denies any aches and pains.  Denies any headaches.  Patient has quit smoking.  Gained weight.  However overall feeling well.   Review of Systems  Constitutional: Negative for chills, diaphoresis, fever, malaise/fatigue and weight loss.  HENT: Negative for nosebleeds and sore throat.   Eyes: Negative for double vision.  Respiratory: Negative for cough, hemoptysis, sputum production, shortness of breath and wheezing.   Cardiovascular: Negative for chest pain, palpitations,  orthopnea and leg swelling.  Gastrointestinal: Negative for abdominal pain, blood in stool, constipation, diarrhea, heartburn, melena, nausea and vomiting.  Genitourinary: Negative for dysuria, frequency and urgency.  Musculoskeletal: Positive for back pain and joint pain.  Skin: Negative.  Negative for itching and rash.  Neurological: Negative for dizziness, tingling, focal weakness, weakness and headaches.  Endo/Heme/Allergies: Does not bruise/bleed easily.  Psychiatric/Behavioral: Negative for depression. The patient is not nervous/anxious and does not have insomnia.       PAST MEDICAL HISTORY :  Past Medical History:  Diagnosis Date   BRCA negative 2016   Breast cancer ([place]) 05/28/15   pT1c,N1a:2/3 SLN positive for macrometastisi,ER positive, PR positive, HER-2/neu not overexpressed c Radiation   Dog bite of left lower leg 03/07/15   HPV in female 2016   Personal history of radiation therapy 2016   BREAST CA   Pneumonia 02/2015   Sleep apnea    STD (female)     PAST SURGICAL HISTORY :   Past Surgical History:  Procedure Laterality Date   ABDOMINAL HYSTERECTOMY  12/01/2015   Procedure: total abdominal hysterectomty, bilateral salpingo- oopharectomy, cystoscopy ;  Surgeon: Benjaman Kindler, MD;  Location: [facility] ORS;  Service: Gynecology;;   BREAST BIOPSY Right 6.7.16   INVASIVE MAMMARY CARCINOMA.    BREAST LUMPECTOMY Right 2016   BREAST CA   BREAST LUMPECTOMY WITH SENTINEL LYMPH NODE BIOPSY Right 07/17/2015   Procedure: RIGHT BREAST WIDE EXCISION WITH SENTINEL NODE BIOPSY;  Surgeon: Robert Bellow, MD;  Location: [facility] ORS;  Service: General;  Laterality: Right;   BREAST SURGERY Right 05/28/15   COLPOSCOPY  05/28/15   High grade squamous intraepithelial lesion  LAPAROSCOPIC VAGINAL HYSTERECTOMY WITH SALPINGO OOPHORECTOMY Bilateral 12/01/2015   Procedure: LAPAROSCOPIC ASSISTED VAGINAL HYSTERECTOMY WITH SALPINGO OOPHORECTOMY-converted to;  Surgeon: Benjaman Kindler, MD;  Location: [facility] ORS;  Service: Gynecology;  Laterality: Bilateral;   WISDOM TOOTH EXTRACTION      FAMILY HISTORY :   Family History  Problem Relation Age of Onset   Cancer Maternal Aunt 30       breast   Breast cancer Maternal Aunt     SOCIAL HISTORY:   Social History   Tobacco Use   Smoking status: Current Every Day Smoker    Packs/day: 1.00    Years: 36.00    Pack years: 36.00    Types: Cigarettes   Smokeless tobacco: Never Used  Substance Use Topics   Alcohol use: Yes    Alcohol/week: 0.0 standard drinks    Comment: occ   Drug use: No    ALLERGIES:  has No Known Allergies.  MEDICATIONS:  Current Outpatient Medications  Medication Sig Dispense Refill   aspirin 81 MG chewable tablet Chew 81 mg by mouth daily.     Calcium-Vitamin D-Vitamin K (VIACTIV PO) Take 1 tablet by mouth daily.     Cholecalciferol (VITAMIN D3) 2000 units TABS Take 1 tablet by mouth daily.     Multiple Vitamins-Minerals (MULTIVITAMIN ADULT) CHEW Chew 2 Doses by mouth daily.     anastrozole (ARIMIDEX) 1 MG tablet TAKE 1 TABLET BY MOUTH EVERY DAY (Patient not taking: Reported on 02/13/2021) 90 tablet 3   ibuprofen (ADVIL,MOTRIN) 600 MG tablet Take 1 tablet (600 mg total) by mouth every 6 (six) hours as needed (mild pain). (Patient not taking: Reported on 02/13/2021) 30 tablet 0   naproxen sodium (ANAPROX) 220 MG tablet Take 220 mg by mouth 2 (two) times daily with a meal. (Patient not taking: Reported on 02/13/2021)     No current facility-administered medications for this visit.    PHYSICAL EXAMINATION: ECOG PERFORMANCE STATUS: 0 - Asymptomatic  BP 123/75    Pulse 74    Temp 97.7 F (36.5 C) (Tympanic)    Resp 20    Ht $R'5\' 3"'OR$  (1.6 m)    Wt 214 lb (97.1 kg)    LMP 09/23/2015    BMI 37.91 kg/m   Filed Weights   02/13/21 1431  Weight: 214 lb (97.1 kg)    Physical Exam HENT:     Head: Normocephalic and atraumatic.     Mouth/Throat:     Pharynx: No oropharyngeal  exudate.  Eyes:     Pupils: Pupils are equal, round, and reactive to light.  Cardiovascular:     Rate and Rhythm: Normal rate and regular rhythm.  Pulmonary:     Effort: No respiratory distress.     Breath sounds: No wheezing.  Abdominal:     General: Bowel sounds are normal. There is no distension.     Palpations: Abdomen is soft. There is no mass.     Tenderness: There is no abdominal tenderness. There is no guarding or rebound.  Musculoskeletal:        General: No tenderness. Normal range of motion.     Cervical back: Normal range of motion and neck supple.  Skin:    General: Skin is warm.     Comments:    Neurological:     Mental Status: She is alert and oriented to person, place, and time.  Psychiatric:        Mood and Affect: Affect normal.  LABORATORY DATA:  I have reviewed the data as listed    Component Value Date/Time   NA 140 02/13/2021 1408   K 4.1 02/13/2021 1408   CL 103 02/13/2021 1408   CO2 26 02/13/2021 1408   GLUCOSE 106 (H) 02/13/2021 1408   BUN 15 02/13/2021 1408   CREATININE 0.77 02/13/2021 1408   CALCIUM 9.2 02/13/2021 1408   PROT 7.9 02/13/2021 1408   ALBUMIN 4.2 02/13/2021 1408   AST 27 02/13/2021 1408   ALT 37 02/13/2021 1408   ALKPHOS 63 02/13/2021 1408   BILITOT 0.3 02/13/2021 1408   GFRNONAA >60 02/13/2021 1408   GFRAA >60 08/15/2020 1410    No results found for: SPEP, UPEP  Lab Results  Component Value Date   WBC 8.4 02/13/2021   NEUTROABS 5.4 02/13/2021   HGB 14.0 02/13/2021   HCT 42.5 02/13/2021   MCV 92.2 02/13/2021   PLT 226 02/13/2021      Chemistry      Component Value Date/Time   NA 140 02/13/2021 1408   K 4.1 02/13/2021 1408   CL 103 02/13/2021 1408   CO2 26 02/13/2021 1408   BUN 15 02/13/2021 1408   CREATININE 0.77 02/13/2021 1408      Component Value Date/Time   CALCIUM 9.2 02/13/2021 1408   ALKPHOS 63 02/13/2021 1408   AST 27 02/13/2021 1408   ALT 37 02/13/2021 1408   BILITOT 0.3 02/13/2021 1408        RADIOGRAPHIC STUDIES: I have personally reviewed the radiological images as listed and agreed with the findings in the report. No results found.   ASSESSMENT & PLAN:  Carcinoma of upper-inner quadrant of right breast in female, estrogen receptor positive ([facility]) # Right Stage II Breast ca; ER/PR positive HER-2/neu negative' s/p  Anastrzole [BSO]/control arm of [place] study. STABLE.   # Mammogram-in aug 2021- wnl. Patient  finished AI x5 years October 2021 low risk-mammaprint.  Follow-up on a yearly basis-moving forward.  #Myalgia/arthritic pain-slight improvement off anastrozole.  Monitor for now.  # # OSTEOPOROSIS-BMD August 2020- T score =-. 2.9; Reclast 5 mg q 12 month; in 7 months from now.  # DISPOSITION:   # mammo in aug 2022.  # follow up in 7 months-MD: /labscbc/cmp/ca-27-29; reclast; BMD prior--Dr.B    Orders Placed This Encounter  Procedures   DG Bone Density    Standing Status:   Future    Standing Expiration Date:   02/13/2022    Order Specific Question:   Reason for Exam (SYMPTOM  OR DIAGNOSIS REQUIRED)    Answer:   osteoporosis    Order Specific Question:   Is the patient pregnant?    Answer:   No    Order Specific Question:   Preferred imaging location?    Answer:   [facility]   MM DIAG BREAST TOMO BILATERAL    Standing Status:   Future    Standing Expiration Date:   02/13/2022    Order Specific Question:   Reason for Exam (SYMPTOM  OR DIAGNOSIS REQUIRED)    Answer:   history of breast cancer    Order Specific Question:   Is the patient pregnant?    Answer:   No    Order Specific Question:   Preferred [facility] location?    Answer[facility]   US Breast Limited Uni Left Inc [place]    Standing Status:   Future    Standing Expiration Date:   02/13/2022    Order  Specific Question:   Reason for Exam (SYMPTOM  OR DIAGNOSIS REQUIRED)    Answer:   history of breast cancer    Order Specific Question:   Preferred imaging location?    Answer:    [facility]   US Breast Limited Uni Right Inc [place]    Standing Status:   Future    Standing Expiration Date:   02/13/2022    Order Specific Question:   Reason for Exam (SYMPTOM  OR DIAGNOSIS REQUIRED)    Answer:   breast cancer    Order Specific Question:   Preferred imaging location?    Answer:   [facility]   CBC with Differential    Standing Status:   Standing    Number of Occurrences:   2    Standing Expiration Date:   02/13/2022   Comprehensive metabolic panel    Standing Status:   Standing    Number of Occurrences:   2    Standing Expiration Date:   02/13/2022   Cancer antigen 27.29    Standing Status:   Standing    Number of Occurrences:   2    Standing Expiration Date:   02/13/2022   All questions were answered. The patient knows to call the clinic with any problems, questions or concerns.      Cammie Sickle, MD 02/13/2021 3:17 PM

## 2021-02-14 LAB — CANCER ANTIGEN 27.29: CA 27.29: 3.6 U/mL (ref 0.0–38.6)

## 2021-02-16 ENCOUNTER — Other Ambulatory Visit: Payer: Self-pay

## 2021-02-16 NOTE — Research (Addendum)
[facility]AS AFT-05 60 Month Follow Up Phase 1:  Paula [name] returns to clinic today for her 60 month follow up visit on the AFT-05 [facility] protocol. Research labs for the protocol were drawn peripherally today, will be shipped on Monday February 16, 2021. She reports that she finished taking her Arimidex on 10/20/2020 and she quit smoking cigarettes on the same day.  Patient continues to have some swelling of her lower extremities but states it has improved dramatically since she stopped taking her Arimidex. She denies any new anticancer medications at this time. Dr. Rogue Bussing was in to review labs and examine patient. Dr. Rogue Bussing reviewed scheduling of her bone density and mammogram in the near future, states he will take care of those orders for her. Her mammogram is not due until August. She does report taking Calcium and vitamin D supplements for her osteoporosis. He did not make any changes to her medications at this time. The research visit schedule was reviewed with the patient, her next visit will be a phone call visit for her 6 year follow up in Phase II. Her visits will be annually now up until 10 years and completion at that time.   Ongoing adverse events are as documented below:   Study/Protocol: AFT-05 [facility] Cycle: 60 Month Follow Up Visit Event Grade Onset Date Resolved Date Drug Name Attribution Treatment Comments    Joint pain Grade 1 09/2015   Arimidex Possibly   Generally improved, Rt knee worse  improved with new job - no longer standing all day  Hot flashes Grade 1 09/2015  02/13/2021 Arimidex Probably   Pt declined tx  improved  Night sweats Grade 1 09/2015  02/13/2021 Arimidex Probably   Pt declined tx  improved  Edema in BLE Grade 1 Reported 06/30/16   Arimidex Possible   Swelling in BLE has decreased   Trace edema today - Rt > Lt  Hyperglycemia Grade 1 11/16/17   Arimidex Unrelated    Glucose 106 today  Osteoporosis Grade  2 08/15/19  Arimidex Possible    bone density in the  future.  Sleep apnea "severe" Grade 3 11/07/17   Arimidex Unrelated   Now using Bi-pap to manage   Jeral Fruit, RN 02/16/2021 8:58 AM

## 2021-02-16 NOTE — Addendum Note (Signed)
Addended by: Delice Bison E on: 02/16/2021 10:59 AM   Modules accepted: Orders

## 2021-08-17 ENCOUNTER — Other Ambulatory Visit: Payer: Self-pay

## 2021-08-17 ENCOUNTER — Ambulatory Visit
Admission: RE | Admit: 2021-08-17 | Discharge: 2021-08-17 | Disposition: A | Discharge status: Home / Self Care | Payer: No Typology Code available for payment source | Source: Ambulatory Visit | Attending: Internal Medicine | Admitting: Internal Medicine

## 2021-08-17 DIAGNOSIS — C50211 Malignant neoplasm of upper-inner quadrant of right female breast: Secondary | ICD-10-CM | POA: Insufficient documentation

## 2021-08-17 DIAGNOSIS — Z170 Estrogen receptor positive status [ER+]: Secondary | ICD-10-CM | POA: Diagnosis present

## 2021-08-17 DIAGNOSIS — M810 Age-related osteoporosis without current pathological fracture: Secondary | ICD-10-CM | POA: Diagnosis present

## 2021-09-11 ENCOUNTER — Inpatient Hospital Stay (HOSPITAL_BASED_OUTPATIENT_CLINIC_OR_DEPARTMENT_OTHER): Payer: No Typology Code available for payment source | Admitting: Internal Medicine

## 2021-09-11 ENCOUNTER — Inpatient Hospital Stay: Payer: No Typology Code available for payment source | Attending: Internal Medicine

## 2021-09-11 ENCOUNTER — Encounter: Payer: Self-pay | Admitting: Internal Medicine

## 2021-09-11 VITALS — BP 139/80 | HR 80 | Temp 99.1°F | Resp 18 | Wt 262.2 lb

## 2021-09-11 DIAGNOSIS — Z170 Estrogen receptor positive status [ER+]: Secondary | ICD-10-CM

## 2021-09-11 DIAGNOSIS — Z7982 Long term (current) use of aspirin: Secondary | ICD-10-CM | POA: Insufficient documentation

## 2021-09-11 DIAGNOSIS — G4730 Sleep apnea, unspecified: Secondary | ICD-10-CM | POA: Insufficient documentation

## 2021-09-11 DIAGNOSIS — Z87891 Personal history of nicotine dependence: Secondary | ICD-10-CM | POA: Insufficient documentation

## 2021-09-11 DIAGNOSIS — F17200 Nicotine dependence, unspecified, uncomplicated: Secondary | ICD-10-CM

## 2021-09-11 DIAGNOSIS — Z79899 Other long term (current) drug therapy: Secondary | ICD-10-CM | POA: Diagnosis not present

## 2021-09-11 DIAGNOSIS — M810 Age-related osteoporosis without current pathological fracture: Secondary | ICD-10-CM

## 2021-09-11 DIAGNOSIS — C50211 Malignant neoplasm of upper-inner quadrant of right female breast: Secondary | ICD-10-CM | POA: Insufficient documentation

## 2021-09-11 DIAGNOSIS — E119 Type 2 diabetes mellitus without complications: Secondary | ICD-10-CM | POA: Diagnosis not present

## 2021-09-11 DIAGNOSIS — Z79811 Long term (current) use of aromatase inhibitors: Secondary | ICD-10-CM | POA: Insufficient documentation

## 2021-09-11 LAB — CBC WITH DIFFERENTIAL/PLATELET
Abs Immature Granulocytes: 0.08 10*3/uL — ABNORMAL HIGH (ref 0.00–0.07)
Basophils Absolute: 0.1 10*3/uL (ref 0.0–0.1)
Basophils Relative: 1 %
Eosinophils Absolute: 0.2 10*3/uL (ref 0.0–0.5)
Eosinophils Relative: 3 %
HCT: 43.6 % (ref 36.0–46.0)
Hemoglobin: 14 g/dL (ref 12.0–15.0)
Immature Granulocytes: 1 %
Lymphocytes Relative: 26 %
Lymphs Abs: 1.9 10*3/uL (ref 0.7–4.0)
MCH: 29.8 pg (ref 26.0–34.0)
MCHC: 32.1 g/dL (ref 30.0–36.0)
MCV: 92.8 fL (ref 80.0–100.0)
Monocytes Absolute: 0.5 10*3/uL (ref 0.1–1.0)
Monocytes Relative: 7 %
Neutro Abs: 4.6 10*3/uL (ref 1.7–7.7)
Neutrophils Relative %: 62 %
Platelets: 238 10*3/uL (ref 150–400)
RBC: 4.7 MIL/uL (ref 3.87–5.11)
RDW: 14.6 % (ref 11.5–15.5)
WBC: 7.3 10*3/uL (ref 4.0–10.5)
nRBC: 0 % (ref 0.0–0.2)

## 2021-09-11 LAB — COMPREHENSIVE METABOLIC PANEL
ALT: 61 U/L — ABNORMAL HIGH (ref 0–44)
AST: 50 U/L — ABNORMAL HIGH (ref 15–41)
Albumin: 4.1 g/dL (ref 3.5–5.0)
Alkaline Phosphatase: 67 U/L (ref 38–126)
Anion gap: 9 (ref 5–15)
BUN: 10 mg/dL (ref 6–20)
CO2: 28 mmol/L (ref 22–32)
Calcium: 9.2 mg/dL (ref 8.9–10.3)
Chloride: 101 mmol/L (ref 98–111)
Creatinine, Ser: 0.69 mg/dL (ref 0.44–1.00)
GFR, Estimated: >60 mL/min (ref >60)
Glucose, Bld: 206 mg/dL — ABNORMAL HIGH (ref 70–99)
Potassium: 4.1 mmol/L (ref 3.5–5.1)
Sodium: 138 mmol/L (ref 135–145)
Total Bilirubin: 0.6 mg/dL (ref 0.3–1.2)
Total Protein: 7.6 g/dL (ref 6.5–8.1)

## 2021-09-11 NOTE — Progress Notes (Signed)
[place] OFFICE PROGRESS NOTE  Patient Care Team: [place], [facility] as PCP - General [place], Forest Gleason, MD (General Surgery) [place], Titus Mould, DO as Referring Physician (Family Medicine) Cammie Sickle, MD as Consulting Physician (Hematology and Oncology)  Cancer Staging Carcinoma of upper-inner quadrant of right breast in female, estrogen receptor positive [facility]) Staging form: Breast, AJCC 7th Edition - Clinical: Stage IIA (T1c, N1, M0) - Signed by Forest Gleason, MD on 08/16/2015 Laterality: Right Method of detection of distant metastases: Clinical Estrogen receptor status: Positive Progesterone receptor status: Positive HER2 status: Negative Multi-gene signature risk of recurrence: Low risk - Pathologic: No stage assigned - Unsigned    Oncology History Overview Note  # 2016- BREAST CANCER  STAGE II T1 cN1 M0 [s/p Lumpec & RT]ER/PR- Pos; HER-2/neu receptor negative. Mammaprint- Low risk; no chemo; Oct 2016- START TAM; [place] study/control arm.   # MARCH 2018- discon Tam [sec to weight gain]; start Arimidex; stopped October 2016 [5 years]  # Dec 2016-  TAH + BSO   # AUG 2020- OSTEOPOROSIS- Reclast q 12   Carcinoma of upper-inner quadrant of right breast in female, estrogen receptor positive ([place])     INTERVAL HISTORY:  Paula [name] 55 y.o.  female pleasant patient above history of stage II ER PR positive breast cancer on surveillance is here for follow-up.  Patient has quit smoking; has gained about 50 pounds.  She is concerned about her weight gain.   Otherwise no nausea no vomiting.  No lumps or bumps.  No chest pain.  Review of Systems  Constitutional:  Negative for chills, diaphoresis, fever, malaise/fatigue and weight loss.  HENT:  Negative for nosebleeds and sore throat.   Eyes:  Negative for double vision.  Respiratory:  Negative for cough, hemoptysis, sputum production, shortness of breath and wheezing.    Cardiovascular:  Negative for chest pain, palpitations, orthopnea and leg swelling.  Gastrointestinal:  Negative for abdominal pain, blood in stool, constipation, diarrhea, heartburn, melena, nausea and vomiting.  Genitourinary:  Negative for dysuria, frequency and urgency.  Musculoskeletal:  Positive for back pain and joint pain.  Skin: Negative.  Negative for itching and rash.  Neurological:  Negative for dizziness, tingling, focal weakness, weakness and headaches.  Endo/Heme/Allergies:  Does not bruise/bleed easily.  Psychiatric/Behavioral:  Negative for depression. The patient is not nervous/anxious and does not have insomnia.      PAST MEDICAL HISTORY :  Past Medical History:  Diagnosis Date   BRCA negative 2016   Breast cancer ([place]) 05/28/15   pT1c,N1a:2/3 SLN positive for macrometastisi,ER positive, PR positive, HER-2/neu not overexpressed c Radiation   Dog bite of left lower leg 03/07/15   HPV in female 2016   Personal history of radiation therapy 2016   BREAST CA   Pneumonia 02/2015   Sleep apnea    STD (female)     PAST SURGICAL HISTORY :   Past Surgical History:  Procedure Laterality Date   ABDOMINAL HYSTERECTOMY  12/01/2015   Procedure: total abdominal hysterectomty, bilateral salpingo- oopharectomy, cystoscopy ;  Surgeon: Benjaman Kindler, MD;  Location: [facility] ORS;  Service: Gynecology;;   BREAST BIOPSY Right 6.7.16   INVASIVE MAMMARY CARCINOMA.    BREAST LUMPECTOMY Right 2016   BREAST CA   BREAST LUMPECTOMY WITH SENTINEL LYMPH NODE BIOPSY Right 07/17/2015   Procedure: RIGHT BREAST WIDE EXCISION WITH SENTINEL NODE BIOPSY;  Surgeon: Robert Bellow, MD;  Location: [facility] ORS;  Service: General;  Laterality: Right;  BREAST SURGERY Right 05/28/15   COLPOSCOPY  05/28/15   High grade squamous intraepithelial lesion   LAPAROSCOPIC VAGINAL HYSTERECTOMY WITH SALPINGO OOPHORECTOMY Bilateral 12/01/2015   Procedure: LAPAROSCOPIC ASSISTED VAGINAL HYSTERECTOMY WITH SALPINGO  OOPHORECTOMY-converted to;  Surgeon: Benjaman Kindler, MD;  Location: [facility] ORS;  Service: Gynecology;  Laterality: Bilateral;   WISDOM TOOTH EXTRACTION      FAMILY HISTORY :   Family History  Problem Relation Age of Onset   Cancer Maternal Aunt 48       breast   Breast cancer Maternal Aunt     SOCIAL HISTORY:   Social History   Tobacco Use   Smoking status: Every Day    Packs/day: 1.00    Years: 36.00    Pack years: 36.00    Types: Cigarettes   Smokeless tobacco: Never  Substance Use Topics   Alcohol use: Yes    Alcohol/week: 0.0 standard drinks    Comment: occ   Drug use: No    ALLERGIES:  has No Known Allergies.  MEDICATIONS:  Current Outpatient Medications  Medication Sig Dispense Refill   aspirin 81 MG chewable tablet Chew 81 mg by mouth daily.     Calcium-Vitamin D-Vitamin K (VIACTIV PO) Take 1 tablet by mouth daily.     Cholecalciferol (VITAMIN D3) 2000 units TABS Take 1 tablet by mouth daily.     Multiple Vitamins-Minerals (MULTIVITAMIN ADULT) CHEW Chew 2 Doses by mouth daily.     ibuprofen (ADVIL,MOTRIN) 600 MG tablet Take 1 tablet (600 mg total) by mouth every 6 (six) hours as needed (mild pain). (Patient not taking: No sig reported) 30 tablet 0   naproxen sodium (ANAPROX) 220 MG tablet Take 220 mg by mouth 2 (two) times daily with a meal. (Patient not taking: No sig reported)     No current facility-administered medications for this visit.    PHYSICAL EXAMINATION: ECOG PERFORMANCE STATUS: 0 - Asymptomatic  BP 139/80   Pulse 80   Temp 99.1 F (37.3 C) (Tympanic)   Resp 18   Wt 262 lb 3 oz (118.9 kg)   LMP 09/23/2015   SpO2 97%   BMI 46.44 kg/m   Filed Weights   09/11/21 1347  Weight: 262 lb 3 oz (118.9 kg)    Physical Exam HENT:     Head: Normocephalic and atraumatic.     Mouth/Throat:     Pharynx: No oropharyngeal exudate.  Eyes:     Pupils: Pupils are equal, round, and reactive to light.  Cardiovascular:     Rate and Rhythm: Normal rate  and regular rhythm.  Pulmonary:     Effort: No respiratory distress.     Breath sounds: No wheezing.  Abdominal:     General: Bowel sounds are normal. There is no distension.     Palpations: Abdomen is soft. There is no mass.     Tenderness: There is no abdominal tenderness. There is no guarding or rebound.  Musculoskeletal:        General: No tenderness. Normal range of motion.     Cervical back: Normal range of motion and neck supple.  Skin:    General: Skin is warm.     Comments:    Neurological:     Mental Status: She is alert and oriented to person, place, and time.  Psychiatric:        Mood and Affect: Affect normal.      LABORATORY DATA:  I have reviewed the data as listed    Component Value  Date/Time   NA 138 09/11/2021 1309   K 4.1 09/11/2021 1309   CL 101 09/11/2021 1309   CO2 28 09/11/2021 1309   GLUCOSE 206 (H) 09/11/2021 1309   BUN 10 09/11/2021 1309   CREATININE 0.69 09/11/2021 1309   CALCIUM 9.2 09/11/2021 1309   PROT 7.6 09/11/2021 1309   ALBUMIN 4.1 09/11/2021 1309   AST 50 (H) 09/11/2021 1309   ALT 61 (H) 09/11/2021 1309   ALKPHOS 67 09/11/2021 1309   BILITOT 0.6 09/11/2021 1309   GFRNONAA >60 09/11/2021 1309   GFRAA >60 08/15/2020 1410    No results found for: SPEP, UPEP  Lab Results  Component Value Date   WBC 7.3 09/11/2021   NEUTROABS 4.6 09/11/2021   HGB 14.0 09/11/2021   HCT 43.6 09/11/2021   MCV 92.8 09/11/2021   PLT 238 09/11/2021      Chemistry      Component Value Date/Time   NA 138 09/11/2021 1309   K 4.1 09/11/2021 1309   CL 101 09/11/2021 1309   CO2 28 09/11/2021 1309   BUN 10 09/11/2021 1309   CREATININE 0.69 09/11/2021 1309      Component Value Date/Time   CALCIUM 9.2 09/11/2021 1309   ALKPHOS 67 09/11/2021 1309   AST 50 (H) 09/11/2021 1309   ALT 61 (H) 09/11/2021 1309   BILITOT 0.6 09/11/2021 1309       RADIOGRAPHIC STUDIES: I have personally reviewed the radiological images as listed and agreed with the  findings in the report. No results found.   ASSESSMENT & PLAN:  Carcinoma of upper-inner quadrant of right breast in female, estrogen receptor positive ([place]) # Right Stage II Breast ca; ER/PR positive HER-2/neu negative' s/p  Anastrzole [ low risk-mammaprint- finished in Oct, 2021-BSO]/control arm of [place] study.  August 2022 mammogram within normal limits.  STABLE.  #Continue surveillance-once a year/continue mammograms yearly.  # OSTEOPOROSIS-BMD August 2020- T score =-. 2.9;  AUG 2022-T-score of -2.0. This patient is considered OSTEOPENIC-improved.  Continue Reclast on a yearly basis.  Tolerating well.  Continue p.o. calcium plus vitamin D.  #LFTs slightly elevated normal bilirubin-suspect fatty liver.  See below regarding weight loss  # Quit smoking: 1.5 ppd x42-  Discussed regarding lung cancer screening program at length; which includes low-dose CT scan on annual basis for 5 years.  Based upon the findings patient would be recommended surveillance/biopsies/or surgery.  Lung cancer program has shown to save lives by early detection of lung cancer.  Patient was asked to inform us if she was interested in pursuing the program.   #History of diabetes/at baseline on diet controlled-postprandial blood sugars 210-recommend talking to PCP regarding restarting oral hypoglycemic agents/weight loss -see below  # weight gain-  Discussed importance of healthy weight/and weight loss.  Strongly recommend eating more green leafy vegetables and cutting down processed food/ carbohydrates.  Instead increasing whole grains / protein in the diet.  Multiple studies have shown that optimal weight would help improve cardiovascular risk; also shown to cut on the risk of malignancies-colon cancer, breast cancer ovarian/uterine cancer in women and also prostate cancer in men.   # DISPOSITION:   # mammo in aug 2023 ;  # referral to [facility] # follow up in 12  months-MD: /labscbc/cmp/ca-27-29; reclast; BMD  prior--Dr.B   Orders Placed This Encounter  Procedures   DG Bone Density    Standing Status:   Future    Standing Expiration Date:   09/11/2022    Order Specific  Question:   Reason for Exam (SYMPTOM  OR DIAGNOSIS REQUIRED)    Answer:   osteoporosis    Order Specific Question:   Is the patient pregnant?    Answer:   No    Order Specific Question:   Preferred imaging location?    Answer:   [facility]   MM 3D SCREEN BREAST BILATERAL    Standing Status:   Future    Standing Expiration Date:   09/11/2022    Order Specific Question:   Reason for Exam (SYMPTOM  OR DIAGNOSIS REQUIRED)    Answer:   breast cancer    Order Specific Question:   Is the patient pregnant?    Answer:   No    Order Specific Question:   Pref[facility] location?    [facility]   CBC with Differential    Standing Status:   Future    Standing Expiration Date:   09/11/2022   Comprehensive metabolic panel    Standing Status:   Future    Standing Expiration Date:   09/11/2022   Cancer antigen 27.29    Standing Status:   Future    Standing Expiration Date:   09/11/2022   Ambulatory Referral for Lung Cancer Screening    Referral Priority:   Routine    Referral Type:   Consultation    Referral Reason:   Specialty Services Required    Number of Visits Requested:   1   All questions were answered. The patient knows to call the clinic with any problems, questions or concerns.      Cammie Sickle, MD 09/11/2021 5:05 PM

## 2021-09-11 NOTE — Research (Addendum)
PALLAS AFT-05 Follow Up:  Paula [name] returns to clinic for her 42 month follow up visit on the AFT-05 PALLAS protocol unaccompanied today.   Dr. Rogue Bussing was in to review labs and examine patient. She denies any new anticancer medications at this time. He did not make any changes to her medication regimen today. Her most recent mammogram was reviewed with her by Dr. Rogue Bussing. He will schedule her for the next mammogram in February 2023. Research nurse met with the patient after her visit with Dr. Rogue Bussing and reviewed her AE's currently and vaccination status for COVID. She denies any adverse events currently. States she did quit smoking but has gained a lot of weight, 50 lbs per patient report. She states she is much more out of breath now than when she was smoking due to the weight increase. Patient was encouraged to follow up with her PCP for possible weight loss treatments available. The research visit schedule was reviewed with the patient, her next visit will be at 46 months. Patient was encouraged to call if she has any questions or concerns prior to her next visit in March.   Ongoing adverse events are as documented below:   Study/Protocol: AFT-05 PALLAS Cycle: Follow Up Visit Event Grade Onset Date Resolved Date Drug Name Attribution Treatment Comments    Joint pain Grade 1 09/2015   Arimidex Possibly   Generally improved, Rt knee worse  improved with new job - no longer standing all day  Hot flashes Grade 1 09/2015  02/13/2021 Arimidex Probably   Pt declined tx  improved  Night sweats Grade 1 09/2015  02/13/2021 Arimidex Probably   Pt declined tx  improved  Edema in BLE Grade 1 Reported 06/30/16   Arimidex Possible   Swelling in BLE has decreased   Trace edema today - Rt > Lt  Hyperglycemia Grade 2 11/16/17 09/11/21  09/11/21 Arimidex Unrelated    Glucose 206 today  Osteoporosis Grade  2 08/15/19  Arimidex Possible    bone density in the future.  Sleep apnea "severe" Grade 3 11/07/17    Arimidex Unrelated   Now using Bi-pap to manage   Dyspnea Grade 1 09/11/2021  None Unrelated None Related to weight gain recently from quitting smoking.    Obesity 3 09/11/21  None Unrelated None Related to quitting smoking per patient.   Jeral Fruit, RN 09/11/2021 2:32 PM

## 2021-09-11 NOTE — Assessment & Plan Note (Addendum)
#  Right Stage II Breast ca; ER/PR positive HER-2/neu negative' s/p  Anastrzole [ low risk-mammaprint- finished in Oct, 2021-BSO]/control arm of [place] study.  August 2022 mammogram within normal limits.  STABLE.  #Continue surveillance-once a year/continue mammograms yearly.  # OSTEOPOROSIS-BMD August 2020- T score =-. 2.9;  AUG 2022-T-score of -2.0. This patient is considered OSTEOPENIC-improved.  Continue Reclast on a yearly basis.  Tolerating well.  Continue p.o. calcium plus vitamin D.  #LFTs slightly elevated normal bilirubin-suspect fatty liver.  See below regarding weight loss  # Quit smoking: 1.5 ppd x42-  Discussed regarding lung cancer screening program at length; which includes low-dose CT scan on annual basis for 5 years.  Based upon the findings patient would be recommended surveillance/biopsies/or surgery.  Lung cancer program has shown to save lives by early detection of lung cancer.  Patient was asked to inform us if she was interested in pursuing the program.   #History of diabetes/at baseline on diet controlled-postprandial blood sugars 210-recommend talking to PCP regarding restarting oral hypoglycemic agents/weight loss -see below  # weight gain-  Discussed importance of healthy weight/and weight loss.  Strongly recommend eating more green leafy vegetables and cutting down processed food/ carbohydrates.  Instead increasing whole grains / protein in the diet.  Multiple studies have shown that optimal weight would help improve cardiovascular risk; also shown to cut on the risk of malignancies-colon cancer, breast cancer ovarian/uterine cancer in women and also prostate cancer in men.   # DISPOSITION:   # mammo in aug 2023 ;  # referral to [facility] # follow up in 12  months-MD: /labscbc/cmp/ca-27-29; reclast; BMD prior--Dr.B

## 2021-09-12 LAB — CANCER ANTIGEN 27.29: CA 27.29: 9.4 U/mL (ref 0.0–38.6)

## 2022-02-12 ENCOUNTER — Telehealth: Payer: Self-pay

## 2022-02-12 DIAGNOSIS — C50211 Malignant neoplasm of upper-inner quadrant of right female breast: Secondary | ICD-10-CM

## 2022-02-12 NOTE — Telephone Encounter (Signed)
Research RN attempted to contact patient for her 6 year follow up protocol phone call. Message left via secure voice mail for patient to return call at her earliest convenience between 8-4:30 pm Monday-Friday.  Jeral Fruit, RN 02/12/22 3:56 PM

## 2022-02-15 NOTE — Telephone Encounter (Addendum)
Research RN attempted to contact the patient via phone again today without success. Message left on secure voice mail with phone # with request for her to return call for her 6 year follow up for the PALLAS protocol. Follow up note request faxed to her primary care MD, Dr. Florene Glen, at [facility] in [place] with ROI included.  Jeral Fruit, RN 02/15/22 9:47 AM

## 2022-08-24 ENCOUNTER — Ambulatory Visit
Admission: RE | Admit: 2022-08-24 | Discharge: 2022-08-24 | Disposition: A | Discharge status: Home / Self Care | Payer: No Typology Code available for payment source | Source: Ambulatory Visit | Attending: Internal Medicine | Admitting: Internal Medicine

## 2022-08-24 DIAGNOSIS — M810 Age-related osteoporosis without current pathological fracture: Secondary | ICD-10-CM | POA: Diagnosis present

## 2022-08-24 DIAGNOSIS — Z170 Estrogen receptor positive status [ER+]: Secondary | ICD-10-CM | POA: Diagnosis present

## 2022-08-24 DIAGNOSIS — C50211 Malignant neoplasm of upper-inner quadrant of right female breast: Secondary | ICD-10-CM | POA: Diagnosis not present

## 2022-09-13 ENCOUNTER — Inpatient Hospital Stay: Payer: No Typology Code available for payment source

## 2022-09-13 ENCOUNTER — Inpatient Hospital Stay: Payer: No Typology Code available for payment source | Attending: Internal Medicine

## 2022-09-13 ENCOUNTER — Inpatient Hospital Stay (HOSPITAL_BASED_OUTPATIENT_CLINIC_OR_DEPARTMENT_OTHER): Payer: No Typology Code available for payment source | Admitting: Internal Medicine

## 2022-09-13 ENCOUNTER — Encounter: Payer: Self-pay | Admitting: Internal Medicine

## 2022-09-13 ENCOUNTER — Other Ambulatory Visit: Payer: Self-pay | Admitting: *Deleted

## 2022-09-13 VITALS — BP 140/77 | HR 91 | Temp 99.4°F

## 2022-09-13 DIAGNOSIS — M810 Age-related osteoporosis without current pathological fracture: Secondary | ICD-10-CM | POA: Insufficient documentation

## 2022-09-13 DIAGNOSIS — F17210 Nicotine dependence, cigarettes, uncomplicated: Secondary | ICD-10-CM | POA: Diagnosis not present

## 2022-09-13 DIAGNOSIS — E119 Type 2 diabetes mellitus without complications: Secondary | ICD-10-CM | POA: Insufficient documentation

## 2022-09-13 DIAGNOSIS — Z853 Personal history of malignant neoplasm of breast: Secondary | ICD-10-CM | POA: Diagnosis present

## 2022-09-13 DIAGNOSIS — C50211 Malignant neoplasm of upper-inner quadrant of right female breast: Secondary | ICD-10-CM | POA: Diagnosis not present

## 2022-09-13 DIAGNOSIS — Z87891 Personal history of nicotine dependence: Secondary | ICD-10-CM | POA: Insufficient documentation

## 2022-09-13 DIAGNOSIS — Z170 Estrogen receptor positive status [ER+]: Secondary | ICD-10-CM | POA: Diagnosis not present

## 2022-09-13 LAB — COMPREHENSIVE METABOLIC PANEL
ALT: 48 U/L — ABNORMAL HIGH (ref 0–44)
AST: 36 U/L (ref 15–41)
Albumin: 3.9 g/dL (ref 3.5–5.0)
Alkaline Phosphatase: 70 U/L (ref 38–126)
Anion gap: 7 (ref 5–15)
BUN: 10 mg/dL (ref 6–20)
CO2: 27 mmol/L (ref 22–32)
Calcium: 9.3 mg/dL (ref 8.9–10.3)
Chloride: 101 mmol/L (ref 98–111)
Creatinine, Ser: 0.67 mg/dL (ref 0.44–1.00)
GFR, Estimated: >60 mL/min (ref >60)
Glucose, Bld: 221 mg/dL — ABNORMAL HIGH (ref 70–99)
Potassium: 4.2 mmol/L (ref 3.5–5.1)
Sodium: 135 mmol/L (ref 135–145)
Total Bilirubin: 0.4 mg/dL (ref 0.3–1.2)
Total Protein: 7.7 g/dL (ref 6.5–8.1)

## 2022-09-13 LAB — CBC WITH DIFFERENTIAL/PLATELET
Abs Immature Granulocytes: 0.04 10*3/uL (ref 0.00–0.07)
Basophils Absolute: 0.1 10*3/uL (ref 0.0–0.1)
Basophils Relative: 1 %
Eosinophils Absolute: 0.3 10*3/uL (ref 0.0–0.5)
Eosinophils Relative: 4 %
HCT: 42.8 % (ref 36.0–46.0)
Hemoglobin: 14.2 g/dL (ref 12.0–15.0)
Immature Granulocytes: 1 %
Lymphocytes Relative: 24 %
Lymphs Abs: 2 10*3/uL (ref 0.7–4.0)
MCH: 30.7 pg (ref 26.0–34.0)
MCHC: 33.2 g/dL (ref 30.0–36.0)
MCV: 92.6 fL (ref 80.0–100.0)
Monocytes Absolute: 0.5 10*3/uL (ref 0.1–1.0)
Monocytes Relative: 6 %
Neutro Abs: 5.5 10*3/uL (ref 1.7–7.7)
Neutrophils Relative %: 64 %
Platelets: 245 10*3/uL (ref 150–400)
RBC: 4.62 MIL/uL (ref 3.87–5.11)
RDW: 14.4 % (ref 11.5–15.5)
WBC: 8.4 10*3/uL (ref 4.0–10.5)
nRBC: 0 % (ref 0.0–0.2)

## 2022-09-13 MED ORDER — ZOLEDRONIC ACID 5 MG/100ML IV SOLN
5.0000 mg | Freq: Once | INTRAVENOUS | Status: AC
  Administered 2022-09-13: 5 mg via INTRAVENOUS
  Filled 2022-09-13: qty 100

## 2022-09-13 MED ORDER — SODIUM CHLORIDE 0.9 % IV SOLN
Freq: Once | INTRAVENOUS | Status: AC
  Filled 2022-09-13: qty 250

## 2022-09-13 NOTE — Progress Notes (Signed)
Patient here today for follow up regarding breast cancer. Patient denies any concerns.  

## 2022-09-13 NOTE — Assessment & Plan Note (Addendum)
#  Right Stage II Breast ca; ER/PR positive HER-2/neu negative' s/p  Anastrzole [ low risk-mammaprint- finished in Oct, 2021-BSO]/control arm of [place] study.  SEP 2023 mammogram within normal limits.  STABLE.  #Continue surveillance-once a year/continue mammograms yearly.   # OSTEOPOROSIS-BMD August 2020- T score =-. 2.9;  AUG 2022-T-score of -2.0. AUG 2023- -1.8 This patient is considered OSTEOPENIC-improved.  Continue Reclast today; will STOP reclast x 3 years.Continue p.o. calcium plus vitamin D.   #LFTs slightly elevated normal bilirubin-suspect fatty liver.  See below regarding weight loss; improved.   # Quit smoking: 1.5 ppd x42- STOPPED 2021-declines [facility]SP for now.   #History of diabetes/at baseline on diet controlled-postprandial blood sugars 220-on ozempic [2 months]-on ozempic. STABLE.   ## DISPOSITION:   # mammo in sep 2024 # reclast today # follow up in 12  months-MD: /labscbc/cmp/ca-27-29; Dr.B

## 2022-09-13 NOTE — Progress Notes (Signed)
[place] OFFICE PROGRESS NOTE  Patient Care Team: [place], [facility] as PCP - General Byrnett, Forest Gleason, MD (General Surgery) Tibbie, Titus Mould, DO as Referring Physician (Family Medicine) Cammie Sickle, MD as Consulting Physician (Hematology and Oncology)   Cancer Staging  Carcinoma of upper-inner quadrant of right breast in female, estrogen receptor positive [facility]) Staging form: Breast, AJCC 7th Edition - Clinical: Stage IIA (T1c, N1, M0) - Signed by Forest Gleason, MD on 08/16/2015 Laterality: Right Method of detection of distant metastases: Clinical Estrogen receptor status: Positive Progesterone receptor status: Positive HER2 status: Negative Multi-gene signature risk of recurrence: Low risk - Pathologic: No stage assigned - Unsigned    Oncology History Overview Note  # 2016- BREAST CANCER  STAGE II T1 cN1 M0 [s/p Lumpec & RT]ER/PR- Pos; HER-2/neu receptor negative. Mammaprint- Low risk; no chemo; Oct 2016- START TAM; [place] [place] study/control arm.   # MARCH 2018- discon Tam [sec to weight gain]; start Arimidex; stopped October 2016 [5 years]  # Dec 2016-  TAH + BSO   # AUG 2020- OSTEOPOROSIS- Reclast q 12   Carcinoma of upper-inner quadrant of right breast in female, estrogen receptor positive ([place])     INTERVAL HISTORY:  Paula [name] 56 y.o.  female pleasant patient above history of stage II ER PR positive breast cancer on surveillance is here for follow-up reviews of the bone density/mammogram.  Patient is currently on Ozempic for her diabetes/weight loss.  Otherwise no nausea no vomiting.  No lumps or bumps.  No chest pain.  Review of Systems  Constitutional:  Negative for chills, diaphoresis, fever, malaise/fatigue and weight loss.  HENT:  Negative for nosebleeds and sore throat.   Eyes:  Negative for double vision.  Respiratory:  Negative for cough, hemoptysis, sputum production, shortness of breath and wheezing.    Cardiovascular:  Negative for chest pain, palpitations, orthopnea and leg swelling.  Gastrointestinal:  Negative for abdominal pain, blood in stool, constipation, diarrhea, heartburn, melena, nausea and vomiting.  Genitourinary:  Negative for dysuria, frequency and urgency.  Musculoskeletal:  Positive for back pain and joint pain.  Skin: Negative.  Negative for itching and rash.  Neurological:  Negative for dizziness, tingling, focal weakness, weakness and headaches.  Endo/Heme/Allergies:  Does not bruise/bleed easily.  Psychiatric/Behavioral:  Negative for depression. The patient is not nervous/anxious and does not have insomnia.       PAST MEDICAL HISTORY :  Past Medical History:  Diagnosis Date   BRCA negative 2016   Breast cancer ([place]) 05/28/15   pT1c,N1a:2/3 SLN positive for macrometastisi,ER positive, PR positive, HER-2/neu not overexpressed c Radiation   Dog bite of left lower leg 03/07/15   HPV in female 2016   Personal history of radiation therapy 2016   BREAST CA   Pneumonia 02/2015   Sleep apnea    STD (female)     PAST SURGICAL HISTORY :   Past Surgical History:  Procedure Laterality Date   ABDOMINAL HYSTERECTOMY  12/01/2015   Procedure: total abdominal hysterectomty, bilateral salpingo- oopharectomy, cystoscopy ;  Surgeon: Benjaman Kindler, MD;  Location: [facility] ORS;  Service: Gynecology;;   BREAST BIOPSY Right 6.7.16   INVASIVE MAMMARY CARCINOMA.    BREAST LUMPECTOMY Right 2016   BREAST CA   BREAST LUMPECTOMY WITH SENTINEL LYMPH NODE BIOPSY Right 07/17/2015   Procedure: RIGHT BREAST WIDE EXCISION WITH SENTINEL NODE BIOPSY;  Surgeon: Robert Bellow, MD;  Location: [facility] ORS;  Service: General;  Laterality: Right;   BREAST  SURGERY Right 05/28/15   COLPOSCOPY  05/28/15   High grade squamous intraepithelial lesion   LAPAROSCOPIC VAGINAL HYSTERECTOMY WITH SALPINGO OOPHORECTOMY Bilateral 12/01/2015   Procedure: LAPAROSCOPIC ASSISTED VAGINAL HYSTERECTOMY WITH SALPINGO  OOPHORECTOMY-converted to;  Surgeon: Benjaman Kindler, MD;  Location: [facility] ORS;  Service: Gynecology;  Laterality: Bilateral;   WISDOM TOOTH EXTRACTION      FAMILY HISTORY :   Family History  Problem Relation Age of Onset   Cancer Maternal Aunt 61       breast   Breast cancer Maternal Aunt     SOCIAL HISTORY:   Social History   Tobacco Use   Smoking status: Every Day    Packs/day: 1.00    Years: 36.00    Total pack years: 36.00    Types: Cigarettes   Smokeless tobacco: Never  Substance Use Topics   Alcohol use: Yes    Alcohol/week: 0.0 standard drinks of alcohol    Comment: occ   Drug use: No    ALLERGIES:  has No Known Allergies.  MEDICATIONS:  Current Outpatient Medications  Medication Sig Dispense Refill   aspirin 81 MG chewable tablet Chew 81 mg by mouth daily.     Calcium-Vitamin D-Vitamin K (VIACTIV PO) Take 1 tablet by mouth daily.     Cholecalciferol (VITAMIN D3) 2000 units TABS Take 1 tablet by mouth daily.     Multiple Vitamins-Minerals (MULTIVITAMIN ADULT) CHEW Chew 2 Doses by mouth daily.     naproxen sodium (ANAPROX) 220 MG tablet Take 220 mg by mouth 2 (two) times daily with a meal.     No current facility-administered medications for this visit.    PHYSICAL EXAMINATION: ECOG PERFORMANCE STATUS: 0 - Asymptomatic  BP 124/84   Pulse 79   Temp 99.1 F (37.3 C) (Tympanic)   Resp 18   Wt 257 lb (116.6 kg)   LMP 09/20/2015   BMI 45.53 kg/m   Filed Weights   09/13/22 1104  Weight: 257 lb (116.6 kg)    Physical Exam HENT:     Head: Normocephalic and atraumatic.     Mouth/Throat:     Pharynx: No oropharyngeal exudate.  Eyes:     Pupils: Pupils are equal, round, and reactive to light.  Cardiovascular:     Rate and Rhythm: Normal rate and regular rhythm.  Pulmonary:     Effort: No respiratory distress.     Breath sounds: No wheezing.  Abdominal:     General: Bowel sounds are normal. There is no distension.     Palpations: Abdomen is soft.  There is no mass.     Tenderness: There is no abdominal tenderness. There is no guarding or rebound.  Musculoskeletal:        General: No tenderness. Normal range of motion.     Cervical back: Normal range of motion and neck supple.  Skin:    General: Skin is warm.     Comments:    Neurological:     Mental Status: She is alert and oriented to person, place, and time.  Psychiatric:        Mood and Affect: Affect normal.       LABORATORY DATA:  I have reviewed the data as listed    Component Value Date/Time   NA 135 09/13/2022 1019   K 4.2 09/13/2022 1019   CL 101 09/13/2022 1019   CO2 27 09/13/2022 1019   GLUCOSE 221 (H) 09/13/2022 1019   BUN 10 09/13/2022 1019   CREATININE 0.67 09/13/2022  1019   CALCIUM 9.3 09/13/2022 1019   PROT 7.7 09/13/2022 1019   ALBUMIN 3.9 09/13/2022 1019   AST 36 09/13/2022 1019   ALT 48 (H) 09/13/2022 1019   ALKPHOS 70 09/13/2022 1019   BILITOT 0.4 09/13/2022 1019   GFRNONAA >60 09/13/2022 1019   GFRAA >60 08/15/2020 1410    No results found for: "SPEP", "UPEP"  Lab Results  Component Value Date   WBC 8.4 09/13/2022   NEUTROABS 5.5 09/13/2022   HGB 14.2 09/13/2022   HCT 42.8 09/13/2022   MCV 92.6 09/13/2022   PLT 245 09/13/2022      Chemistry      Component Value Date/Time   NA 135 09/13/2022 1019   K 4.2 09/13/2022 1019   CL 101 09/13/2022 1019   CO2 27 09/13/2022 1019   BUN 10 09/13/2022 1019   CREATININE 0.67 09/13/2022 1019      Component Value Date/Time   CALCIUM 9.3 09/13/2022 1019   ALKPHOS 70 09/13/2022 1019   AST 36 09/13/2022 1019   ALT 48 (H) 09/13/2022 1019   BILITOT 0.4 09/13/2022 1019       RADIOGRAPHIC STUDIES: I have personally reviewed the radiological images as listed and agreed with the findings in the report. No results found.   ASSESSMENT & PLAN:  Carcinoma of upper-inner quadrant of right breast in female, estrogen receptor positive ([place]) # Right Stage II Breast ca; ER/PR positive HER-2/neu  negative' s/p  Anastrzole [ low risk-mammaprint- finished in Oct, 2021-BSO]/control arm of [place] study.  SEP 2023 mammogram within normal limits.  STABLE.  #Continue surveillance-once a year/continue mammograms yearly.   # OSTEOPOROSIS-BMD August 2020- T score =-. 2.9;  AUG 2022-T-score of -2.0. AUG 2023- -1.8 This patient is considered OSTEOPENIC-improved.  Continue Reclast today; will STOP reclast x 3 years.Continue p.o. calcium plus vitamin D.   #LFTs slightly elevated normal bilirubin-suspect fatty liver.  See below regarding weight loss; improved.   # Quit smoking: 1.5 ppd x42- STOPPED 2021-declines LCSP for now.   #History of diabetes/at baseline on diet controlled-postprandial blood sugars 220-on ozempic [2 months]-on ozempic. STABLE.   ## DISPOSITION:   # mammo in sep 2024 # reclast today # follow up in 12  months-MD: /labscbc/cmp/ca-27-29; Dr.B   Orders Placed This Encounter  Procedures   MM 3D SCREEN BREAST BILATERAL    Standing Status:   Future    Standing Expiration Date:   09/14/2023    Order Specific Question:   Reason for Exam (SYMPTOM  OR DIAGNOSIS REQUIRED)    Answer:   breast cancer    Order Specific Question:   Preferred imaging location?    Answer:   [facility]    Order Specific Question:   Is the patient pregnant?    Answer:   No   CBC with Differential/Platelet    Standing Status:   Future    Standing Expiration Date:   09/14/2023   Comprehensive metabolic panel    Standing Status:   Future    Standing Expiration Date:   09/14/2023   Cancer antigen 27.29    Standing Status:   Future    Standing Expiration Date:   09/14/2023   All questions were answered. The patient knows to call the clinic with any problems, questions or concerns.      Cammie Sickle, MD 09/13/2022 12:18 PM

## 2022-09-14 LAB — CANCER ANTIGEN 27.29: CA 27.29: <9 U/mL (ref 0.0–38.6)

## 2023-02-07 ENCOUNTER — Telehealth: Payer: Self-pay

## 2023-02-07 NOTE — Telephone Encounter (Signed)
Research nurse called patient to schedule a 7 year follow up lab appointment for research / central labs required by the PALLAS protocol. Message left on secure identified voice mail for the patient to return call at her earliest convenience. Explained that the labs were due to be drawn around the 25 th of this month.  Jeral Fruit, RN 02/07/23 12:16 PM

## 2023-02-10 ENCOUNTER — Telehealth: Payer: Self-pay | Admitting: Medical Oncology

## 2023-02-10 NOTE — Telephone Encounter (Signed)
PALLAS: PALBOCICLIB COLLABORATIVE ADJUVANT STUDY: A RANDOMIZED PHASE III TRIAL OF PALBOCICLIB WITH STANDARD ADJUVANT ENDOCRINE THERAPY VERSUS STANDARD ADJUVANT ENDOCRINE THERAPY ALONE FOR HORMONE RECEPTOR POSITIVE (HR+)/HUMAN EPIDERMAL GROWTH FACTOR RECEPTOR 2 (HER2)-NEGATIVE EARLY BREAST CANCER   Outgoing call: [phone] Call to patient in hopes to schedule research study lab appointment for her 7 year follow up on study.  I left a VM with patient asking her to return call to let me know if she is available for a lab appointment tomorrow. Patient thanked.   Maxwell Marion, RN, BSN, [facility] Clinical Research Nurse Lead 02/10/2023 1:01 PM

## 2023-02-17 DIAGNOSIS — Z170 Estrogen receptor positive status [ER+]: Secondary | ICD-10-CM

## 2023-02-17 NOTE — Research (Signed)
[facility] 7 Year Follow Up:   Research nurse has attempted to contact patient via telephone again today to schedule a research lab appointment and touch base with with her for the [facility]AS protocol follow up. Message left on secure voice mail stating this request and return phone number provided.  Jeral Fruit, RN 02/17/23 11:23 AM

## 2023-02-21 ENCOUNTER — Telehealth: Payer: Self-pay

## 2023-02-21 DIAGNOSIS — Z170 Estrogen receptor positive status [ER+]: Secondary | ICD-10-CM

## 2023-02-21 NOTE — Telephone Encounter (Signed)
This nurse reached out to patient again today to attempt to contact her for her 7 year PALLAS follow up call. There was no answer. Message left on secure voice mail for patient to please return call as soon as she can. Reminded patient she can withdraw from the protocol at any time if she no longer wants to come in the cancer center for her annual follow up. Explained that she can withdraw all consent for follow up and labs or consent to be followed without any contact or research labs. Research office number left on voice mail for the patient to call. Jeral Fruit, RN 02/21/23 2:18 PM

## 2023-02-28 NOTE — Telephone Encounter (Signed)
Research nurse attempted to speak with patient again today without success. Call placed to work and mobile phone number. Message left for patient to return call as soon as possible to the research office. Patient has not read her myChart message sent to her from this research nurse. Research nurse will send patient a certified letter in the mail if she doesn't respond to this telephone call today.  Jeral Fruit, RN 02/28/23 11:24 AM

## 2023-08-19 ENCOUNTER — Encounter: Payer: Self-pay | Admitting: Internal Medicine

## 2023-08-26 ENCOUNTER — Ambulatory Visit
Admission: RE | Admit: 2023-08-26 | Discharge: 2023-08-26 | Disposition: A | Discharge status: Home / Self Care | Payer: BLUE CROSS/BLUE SHIELD | Source: Ambulatory Visit | Attending: Internal Medicine | Admitting: Internal Medicine

## 2023-08-26 ENCOUNTER — Encounter: Payer: Self-pay | Admitting: Internal Medicine

## 2023-08-26 DIAGNOSIS — Z170 Estrogen receptor positive status [ER+]: Secondary | ICD-10-CM | POA: Insufficient documentation

## 2023-08-26 DIAGNOSIS — C50211 Malignant neoplasm of upper-inner quadrant of right female breast: Secondary | ICD-10-CM | POA: Diagnosis present

## 2023-08-26 DIAGNOSIS — Z1231 Encounter for screening mammogram for malignant neoplasm of breast: Secondary | ICD-10-CM | POA: Diagnosis not present

## 2023-09-08 ENCOUNTER — Other Ambulatory Visit: Payer: Self-pay

## 2023-09-08 DIAGNOSIS — Z170 Estrogen receptor positive status [ER+]: Secondary | ICD-10-CM

## 2023-09-14 ENCOUNTER — Inpatient Hospital Stay: Payer: BLUE CROSS/BLUE SHIELD

## 2023-09-14 ENCOUNTER — Inpatient Hospital Stay: Payer: BLUE CROSS/BLUE SHIELD | Attending: Internal Medicine | Admitting: Internal Medicine

## 2023-09-14 ENCOUNTER — Encounter: Payer: Self-pay | Admitting: Internal Medicine

## 2023-09-14 VITALS — BP 162/78 | HR 95 | Temp 99.7°F | Ht 63.0 in | Wt 242.0 lb

## 2023-09-14 DIAGNOSIS — Z170 Estrogen receptor positive status [ER+]: Secondary | ICD-10-CM | POA: Diagnosis not present

## 2023-09-14 DIAGNOSIS — C50211 Malignant neoplasm of upper-inner quadrant of right female breast: Secondary | ICD-10-CM

## 2023-09-14 DIAGNOSIS — Z7985 Long-term (current) use of injectable non-insulin antidiabetic drugs: Secondary | ICD-10-CM | POA: Diagnosis not present

## 2023-09-14 DIAGNOSIS — F17210 Nicotine dependence, cigarettes, uncomplicated: Secondary | ICD-10-CM | POA: Diagnosis not present

## 2023-09-14 DIAGNOSIS — Z7982 Long term (current) use of aspirin: Secondary | ICD-10-CM | POA: Diagnosis not present

## 2023-09-14 DIAGNOSIS — E119 Type 2 diabetes mellitus without complications: Secondary | ICD-10-CM | POA: Insufficient documentation

## 2023-09-14 DIAGNOSIS — M810 Age-related osteoporosis without current pathological fracture: Secondary | ICD-10-CM | POA: Insufficient documentation

## 2023-09-14 DIAGNOSIS — Z853 Personal history of malignant neoplasm of breast: Secondary | ICD-10-CM | POA: Diagnosis present

## 2023-09-14 LAB — CBC WITH DIFFERENTIAL/PLATELET
Abs Immature Granulocytes: 0.05 10*3/uL (ref 0.00–0.07)
Basophils Absolute: 0.1 10*3/uL (ref 0.0–0.1)
Basophils Relative: 1 %
Eosinophils Absolute: 0.2 10*3/uL (ref 0.0–0.5)
Eosinophils Relative: 2 %
HCT: 44.2 % (ref 36.0–46.0)
Hemoglobin: 14.4 g/dL (ref 12.0–15.0)
Immature Granulocytes: 1 %
Lymphocytes Relative: 25 %
Lymphs Abs: 2.1 10*3/uL (ref 0.7–4.0)
MCH: 30.6 pg (ref 26.0–34.0)
MCHC: 32.6 g/dL (ref 30.0–36.0)
MCV: 93.8 fL (ref 80.0–100.0)
Monocytes Absolute: 0.6 10*3/uL (ref 0.1–1.0)
Monocytes Relative: 7 %
Neutro Abs: 5.6 10*3/uL (ref 1.7–7.7)
Neutrophils Relative %: 64 %
Platelets: 249 10*3/uL (ref 150–400)
RBC: 4.71 MIL/uL (ref 3.87–5.11)
RDW: 13.8 % (ref 11.5–15.5)
WBC: 8.6 10*3/uL (ref 4.0–10.5)
nRBC: 0 % (ref 0.0–0.2)

## 2023-09-14 LAB — COMPREHENSIVE METABOLIC PANEL
ALT: 43 U/L (ref 0–44)
AST: 34 U/L (ref 15–41)
Albumin: 4.2 g/dL (ref 3.5–5.0)
Alkaline Phosphatase: 73 U/L (ref 38–126)
Anion gap: 8 (ref 5–15)
BUN: 9 mg/dL (ref 6–20)
CO2: 28 mmol/L (ref 22–32)
Calcium: 9.8 mg/dL (ref 8.9–10.3)
Chloride: 101 mmol/L (ref 98–111)
Creatinine, Ser: 0.66 mg/dL (ref 0.44–1.00)
GFR, Estimated: >60 mL/min (ref >60)
Glucose, Bld: 137 mg/dL — ABNORMAL HIGH (ref 70–99)
Potassium: 4 mmol/L (ref 3.5–5.1)
Sodium: 137 mmol/L (ref 135–145)
Total Bilirubin: 0.6 mg/dL (ref 0.3–1.2)
Total Protein: 8.2 g/dL — ABNORMAL HIGH (ref 6.5–8.1)

## 2023-09-14 NOTE — Progress Notes (Signed)
No questions/concerns today.  Has started in ozempic.

## 2023-09-14 NOTE — Research (Signed)
P[facility]AS: PALBOCICLIB COLLABORATIVE ADJUVANT STUDY: A RANDOMIZED PHASE III TRIAL OF PALBOCICLIB WITH STANDARD ADJUVANT ENDOCRINE THERAPY VERSUS STANDARD ADJUVANT ENDOCRINE THERAPY ALONE FOR HORMONE RECEPTOR POSITIVE (HR+)/HUMAN EPIDERMAL GROWTH FACTOR RECEPTOR 2 (HER2)-NEGATIVE EARLY BREAST CANCER   Patient in to the cancer center today for her scheduled follow up visit with Dr. Donneta Romberg. Research nurse met with patient in private waiting area prior to her lab appointment and reviewed her current anti-cancer medications, breast cancer status and Covid 19 Vaccination status per protocol requirements. Patient states the only new issue she has is diabetes for which she attributes the Tamoxifen she is taking for her breast cancer for. She stated she was doing alright until she started taking that "stuff". She did not report any other symptoms related to the Tamoxifen. States she has no recurrence of her breast cancer or any changes with her breasts currently. Her mammogram from 08/26/2023 is negative. She is not taking any new anti-cancer medications. She reports she has had the Covid vaccine x 2 injections only. She did have Covid after she took the vaccine a few years ago. Patient was asked to guesstimate a time frame- she states around the fall of 2020 probably but she is not sure. She hasn't taken any more Covid vaccines at this time. Research labs and CEA were drawn by Fabian Sharp, phlebotomist at 215 pm via fresh venipuncture x 1 attempt without any difficulties. The labs were drawn from her unaffected left arm. The patient denied any pain at the site and complimented Quenten Raven on her skills today. The patient was then assisted to the waiting room for her follow up with Dr. Donneta Romberg. The research schedule for PALLAS was reviewed with the patient, she is aware that follow ups are only annually and can be over the telephone now if needed. The patient was thanked for her time today and encouraged to call research  for any questions or issues she may have in relation to her participation in clinical trials.  Chriss Driver, RN 09/14/23 2:44 PM

## 2023-09-14 NOTE — Progress Notes (Signed)
[facility] OFFICE PROGRESS NOTE  Patient Care Team: [place], [facility] as PCP - General Byrnett, Merrily Pew, MD (General Surgery) Santayana, Saintclair Halsted, DO as Referring Physician (Family Medicine) Earna Coder, MD as Consulting Physician (Hematology and Oncology)   Cancer Staging  Carcinoma of upper-inner quadrant of right breast in female, estrogen receptor positive [facility]) Staging form: Breast, AJCC 7th Edition - Clinical: Stage IIA (T1c, N1, M0) - Signed by Johney [place], MD on 08/16/2015 Laterality: Right Method of detection of distant metastases: Clinical Estrogen receptor status: Positive Progesterone receptor status: Positive HER2 status: Negative Multi-gene signature risk of recurrence: Low risk - Pathologic: No stage assigned - Unsigned    Oncology History Overview Note  # 2016- BREAST CANCER  STAGE II T1 cN1 M0 [s/p Lumpec & RT]ER/PR- Pos; HER-2/neu receptor negative. Mammaprint- Low risk; no chemo; Oct 2016- START TAM; [facility]yl [place] study/control arm.   # MARCH 2018- discon Tam [sec to weight gain]; start Arimidex; stopped October 2016 [5 years]  # Dec 2016-  TAH + BSO   # AUG 2020- OSTEOPOROSIS- Reclast q 12   Carcinoma of upper-inner quadrant of right breast in female, estrogen receptor positive ([facility])    INTERVAL HISTORY: Patient ambulating-independently.   Alone.  Paula [name] 57 y.o.  female pleasant patient above history of stage II ER PR positive breast cancer on surveillance is here for follow-up.   Patient is currently on Ozempic for her diabetes/weight loss.  Otherwise no nausea no vomiting.  No lumps or bumps.  No chest pain.  Review of Systems  Constitutional:  Negative for chills, diaphoresis, fever, malaise/fatigue and weight loss.  HENT:  Negative for nosebleeds and sore throat.   Eyes:  Negative for double vision.  Respiratory:  Negative for cough, hemoptysis, sputum production, shortness of breath and wheezing.    Cardiovascular:  Negative for chest pain, palpitations, orthopnea and leg swelling.  Gastrointestinal:  Negative for abdominal pain, blood in stool, constipation, diarrhea, heartburn, melena, nausea and vomiting.  Genitourinary:  Negative for dysuria, frequency and urgency.  Musculoskeletal:  Positive for back pain and joint pain.  Skin: Negative.  Negative for itching and rash.  Neurological:  Negative for dizziness, tingling, focal weakness, weakness and headaches.  Endo/Heme/Allergies:  Does not bruise/bleed easily.  Psychiatric/Behavioral:  Negative for depression. The patient is not nervous/anxious and does not have insomnia.       PAST MEDICAL HISTORY :  Past Medical History:  Diagnosis Date   BRCA negative 2016   Breast cancer ([facility]) 05/28/15   pT1c,N1a:2/3 SLN positive for macrometastisi,ER positive, PR positive, HER-2/neu not overexpressed c Radiation   Dog bite of left lower leg 03/07/15   HPV in female 2016   Personal history of radiation therapy 2016   BREAST CA   Pneumonia 02/2015   Sleep apnea    STD (female)     PAST SURGICAL HISTORY :   Past Surgical History:  Procedure Laterality Date   ABDOMINAL HYSTERECTOMY  12/01/2015   Procedure: total abdominal hysterectomty, bilateral salpingo- oopharectomy, cystoscopy ;  Surgeon: Christeen Douglas, MD;  Location: [facility] ORS;  Service: Gynecology;;   BREAST BIOPSY Right 6.7.16   INVASIVE MAMMARY CARCINOMA.    BREAST LUMPECTOMY Right 2016   BREAST CA   BREAST LUMPECTOMY WITH SENTINEL LYMPH NODE BIOPSY Right 07/17/2015   Procedure: RIGHT BREAST WIDE EXCISION WITH SENTINEL NODE BIOPSY;  Surgeon: Earline Mayotte, MD;  Location: [facility] ORS;  Service: General;  Laterality: Right;   BREAST  SURGERY Right 05/28/15   COLPOSCOPY  05/28/15   High grade squamous intraepithelial lesion   LAPAROSCOPIC VAGINAL HYSTERECTOMY WITH SALPINGO OOPHORECTOMY Bilateral 12/01/2015   Procedure: LAPAROSCOPIC ASSISTED VAGINAL HYSTERECTOMY WITH SALPINGO  OOPHORECTOMY-converted to;  Surgeon: Christeen Douglas, MD;  Location: [facility] ORS;  Service: Gynecology;  Laterality: Bilateral;   WISDOM TOOTH EXTRACTION      FAMILY HISTORY :   Family History  Problem Relation Age of Onset   Cancer Maternal Aunt 59       breast   Breast cancer Maternal Aunt     SOCIAL HISTORY:   Social History   Tobacco Use   Smoking status: Every Day    Current packs/day: 1.00    Average packs/day: 1 pack/day for 36.0 years (36.0 ttl pk-yrs)    Types: Cigarettes   Smokeless tobacco: Never  Substance Use Topics   Alcohol use: Yes    Alcohol/week: 0.0 standard drinks of alcohol    Comment: occ   Drug use: No    ALLERGIES:  has No Known Allergies.  MEDICATIONS:  Current Outpatient Medications  Medication Sig Dispense Refill   aspirin 81 MG chewable tablet Chew 81 mg by mouth daily.     Multiple Vitamins-Minerals (MULTIVITAMIN ADULT) CHEW Chew 2 Doses by mouth daily.     Semaglutide, 2 MG/DOSE, (OZEMPIC, 2 MG/DOSE,) 8 MG/3ML SOPN SMARTSIG:0.75 Milliliter(s) SUB-Q Once a Week     No current facility-administered medications for this visit.    PHYSICAL EXAMINATION: ECOG PERFORMANCE STATUS: 0 - Asymptomatic  BP (!) 162/78 (BP Location: Left Arm, Patient Position: Sitting, Cuff Size: Large)   Pulse 95   Temp 99.7 F (37.6 C) (Tympanic)   Ht 5\' 3"  (1.6 m)   Wt 242 lb (109.8 kg)   LMP 09/20/2015   SpO2 94%   BMI 42.87 kg/m   Filed Weights   09/14/23 1412  Weight: 242 lb (109.8 kg)     Physical Exam HENT:     Head: Normocephalic and atraumatic.     Mouth/Throat:     Pharynx: No oropharyngeal exudate.  Eyes:     Pupils: Pupils are equal, round, and reactive to light.  Cardiovascular:     Rate and Rhythm: Normal rate and regular rhythm.  Pulmonary:     Effort: No respiratory distress.     Breath sounds: No wheezing.  Abdominal:     General: Bowel sounds are normal. There is no distension.     Palpations: Abdomen is soft. There is no mass.      Tenderness: There is no abdominal tenderness. There is no guarding or rebound.  Musculoskeletal:        General: No tenderness. Normal range of motion.     Cervical back: Normal range of motion and neck supple.  Skin:    General: Skin is warm.     Comments:    Neurological:     Mental Status: She is alert and oriented to person, place, and time.  Psychiatric:        Mood and Affect: Affect normal.       LABORATORY DATA:  I have reviewed the data as listed    Component Value Date/Time   NA 137 09/14/2023 1406   K 4.0 09/14/2023 1406   CL 101 09/14/2023 1406   CO2 28 09/14/2023 1406   GLUCOSE 137 (H) 09/14/2023 1406   BUN 9 09/14/2023 1406   CREATININE 0.66 09/14/2023 1406   CALCIUM 9.8 09/14/2023 1406   PROT 8.2 (H) 09/14/2023  1406   ALBUMIN 4.2 09/14/2023 1406   AST 34 09/14/2023 1406   ALT 43 09/14/2023 1406   ALKPHOS 73 09/14/2023 1406   BILITOT 0.6 09/14/2023 1406   GFRNONAA >60 09/14/2023 1406   GFRAA >60 08/15/2020 1410    No results found for: "SPEP", "UPEP"  Lab Results  Component Value Date   WBC 8.6 09/14/2023   NEUTROABS 5.6 09/14/2023   HGB 14.4 09/14/2023   HCT 44.2 09/14/2023   MCV 93.8 09/14/2023   PLT 249 09/14/2023      Chemistry      Component Value Date/Time   NA 137 09/14/2023 1406   K 4.0 09/14/2023 1406   CL 101 09/14/2023 1406   CO2 28 09/14/2023 1406   BUN 9 09/14/2023 1406   CREATININE 0.66 09/14/2023 1406      Component Value Date/Time   CALCIUM 9.8 09/14/2023 1406   ALKPHOS 73 09/14/2023 1406   AST 34 09/14/2023 1406   ALT 43 09/14/2023 1406   BILITOT 0.6 09/14/2023 1406       RADIOGRAPHIC STUDIES: I have personally reviewed the radiological images as listed and agreed with the findings in the report. No results found.   ASSESSMENT & PLAN:  Carcinoma of upper-inner quadrant of right breast in female, estrogen receptor positive ([facility]) # Right Stage II Breast ca; ER/PR positive HER-2/neu negative' s/p  Anastrzole  [ low risk-mammaprint- finished in Oct, 2021-BSO]/control arm of [place] study.  SEP 2024 mammogram within normal limits.  Stable.   # Continue surveillance-once a year/continue mammograms yearly.   # OSTEOPOROSIS-BMD August 2020- T score =-. 2.9;  AUG 2022-T-score of -2.0. AUG 2023- -1.8 This patient is considered OSTEOPENIC-improved.  Reclast x 3 years [last SEP 2023]. Continue p.o. calcium plus vitamin D.   #LFTs slightly elevated normal bilirubin-suspect fatty liver- stable.    # Quit smoking: 1.5 ppd x42- STOPPED 2021-declines LCSP for now. STABLE.   #History of diabetes/at baseline on diet controlled-postprandial blood sugars 137 on ozempic [2 months]-on ozempic.stable.   #Since patient is clinically stable I think is reasonable for the patient to follow-up with PCP/can follow-up with [place] as needed.  Patient comfortable with the plan; to call us if any questions or concerns in the interim. Would not recommend checking tumor maker; continue annual mammograms.   # DISPOSITION:   # follow up as needed- Dr.B   No orders of the defined types were placed in this encounter.  All questions were answered. The patient knows to call the clinic with any problems, questions or concerns.      Earna Coder, MD 09/14/2023 3:02 PM

## 2023-09-14 NOTE — Assessment & Plan Note (Addendum)
#   Right Stage II Breast ca; ER/PR positive HER-2/neu negative' s/p  Anastrzole [ low risk-mammaprint- finished in Oct, 2021-BSO]/control arm of [place] study.  SEP 2024 mammogram within normal limits.  Stable.   # Continue surveillance-once a year/continue mammograms yearly.   # OSTEOPOROSIS-BMD August 2020- T score =-. 2.9;  AUG 2022-T-score of -2.0. AUG 2023- -1.8 This patient is considered OSTEOPENIC-improved.  Reclast x 3 years [last SEP 2023]. Continue p.o. calcium plus vitamin D.   #LFTs slightly elevated normal bilirubin-suspect fatty liver- stable.    # Quit smoking: 1.5 ppd x42- STOPPED 2021-declines LCSP for now. STABLE.   #History of diabetes/at baseline on diet controlled-postprandial blood sugars 137 on ozempic [2 months]-on ozempic.stable.   #Since patient is clinically stable I think is reasonable for the patient to follow-up with PCP/can follow-up with [place] as needed.  Patient comfortable with the plan; to call us if any questions or concerns in the interim. Would not recommend checking tumor maker; continue annual mammograms.   # DISPOSITION:   # follow up as needed- Dr.B

## 2023-09-15 LAB — CANCER ANTIGEN 27.29: CA 27.29: 9.6 U/mL (ref 0.0–38.6)

## 2023-09-27 ENCOUNTER — Ambulatory Visit (INDEPENDENT_AMBULATORY_CARE_PROVIDER_SITE_OTHER): Payer: BLUE CROSS/BLUE SHIELD

## 2023-09-27 ENCOUNTER — Encounter: Payer: Self-pay | Admitting: Internal Medicine

## 2023-09-27 ENCOUNTER — Ambulatory Visit
Admission: RE | Admit: 2023-09-27 | Discharge: 2023-09-27 | Disposition: A | Discharge status: Home / Self Care | Payer: BLUE CROSS/BLUE SHIELD | Source: Ambulatory Visit | Attending: Physician Assistant | Admitting: Physician Assistant

## 2023-09-27 VITALS — BP 144/81 | HR 83 | Temp 98.6°F | Resp 16 | Ht 63.0 in | Wt 242.0 lb

## 2023-09-27 DIAGNOSIS — S93492A Sprain of other ligament of left ankle, initial encounter: Secondary | ICD-10-CM

## 2023-09-27 DIAGNOSIS — M25572 Pain in left ankle and joints of left foot: Secondary | ICD-10-CM | POA: Diagnosis not present

## 2023-09-27 DIAGNOSIS — S82832A Other fracture of upper and lower end of left fibula, initial encounter for closed fracture: Secondary | ICD-10-CM

## 2023-09-27 NOTE — ED Provider Notes (Signed)
[facility]    CSN: [id number] Arrival date & time: 09/27/23  1157      History   Chief Complaint Chief Complaint  Patient presents with   Ankle Pain    Appt    HPI Paula [name] is a 57 y.o. female presenting for left ankle pain and swelling for the past 3 days.  Patient reports she stepped in a divot and tripped.  Reports falling forward and catching herself.  Reports that she is able to walk with a limp.  No numbness or tingling.  Has been elevating, icing and taking Advil dual action strength.  States she does not really have any pain is when she goes to bear weight or touches the ankle.  She is concerned about a possible fracture.  No other injuries or complaints.  Medical history is significant for osteoporosis, sleep apnea and breast cancer.  HPI  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   Postoperative state 12/01/2015   CIN II (cervical intraepithelial neoplasia II) 12/01/2015   Fat necrosis of breast 08/15/2015   Infected postoperative seroma 08/01/2015   Carcinoma of upper-inner quadrant of right breast in female, estrogen receptor positive (HCC) 06/24/2015   Acute respiratory failure with hypoxia (HCC) 02/27/2015   Influenza due to influenza A virus 02/27/2015   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    BREAST LUMPECTOMY Right 2016   BREAST CA   BREAST LUMPECTOMY WITH SENTINEL LYMPH NODE BIOPSY Right 07/17/2015   Procedure: RIGHT BREAST WIDE EXCISION WITH SENTINEL NODE BIOPSY;  Surgeon: Earline Mayotte, MD;  Location: [facility];  Service: General;  Laterality: Right;   BREAST SURGERY Right 05/28/15   COLPOSCOPY  05/28/15   High grade squamous intraepithelial lesion   LAPAROSCOPIC VAGINAL HYSTERECTOMY WITH SALPINGO OOPHORECTOMY Bilateral 12/01/2015   Procedure: LAPAROSCOPIC ASSISTED VAGINAL HYSTERECTOMY WITH SALPINGO OOPHORECTOMY-converted to;  Surgeon: Christeen [place], MD;  Location: [facility];  Service: Gynecology;  Laterality: Bilateral;   WISDOM TOOTH EXTRACTION      OB History     Gravida  1   Para  1   Term      Preterm      AB      Living  1      SAB      IAB      Ectopic      Multiple      Live Births           Obstetric Comments  1st Menstrual Cycle:  11 1st Pregnancy:  24  Home Medications    Prior to Admission medications   Medication Sig Start Date End Date Taking? Authorizing Provider  aspirin 81 MG chewable tablet Chew 81 mg by mouth daily.   Yes [provider]  Multiple Vitamins-Minerals (MULTIVITAMIN ADULT) CHEW Chew 2 Doses by mouth daily.   Yes [provider]  Semaglutide, 2 MG/DOSE, (OZEMPIC, 2 MG/DOSE,) 8 MG/3ML SOPN SMARTSIG:0.75 Milliliter(s) SUB-Q Once a Week   Yes [provider]    Family History Family History  Problem Relation Age of Onset   Cancer Maternal Aunt 13       breast   Breast cancer Maternal Aunt     Social History Social History   Tobacco Use   Smoking status: Every Day     Current packs/day: 1.00    Average packs/day: 1 pack/day for 36.0 years (36.0 ttl pk-yrs)    Types: Cigarettes   Smokeless tobacco: Never  Substance Use Topics   Alcohol use: Yes    Alcohol/week: 0.0 standard drinks of alcohol    Comment: occ   Drug use: No     Allergies   Patient has no known allergies.   Review of Systems Review of Systems  Musculoskeletal:  Positive for arthralgias, gait problem and joint swelling.  Skin:  Negative for color change and wound.  Neurological:  Negative for weakness and numbness.     Physical Exam Triage Vital Signs ED Triage Vitals  Encounter Vitals Group     BP 09/27/23 1200 (!) 144/81     Systolic BP Percentile --      Diastolic BP Percentile --      Pulse Rate 09/27/23 1200 83     Resp 09/27/23 1200 16     Temp 09/27/23 1200 98.6 F (37 C)     Temp Source 09/27/23 1200 Oral     SpO2 09/27/23 1200 97 %     Weight 09/27/23 1200 242 lb (109.8 kg)     Height 09/27/23 1200 5\' 3"  (1.6 m)     Head Circumference --      Peak Flow --      Pain Score 09/27/23 1204 10     Pain Loc --      Pain Education --      Exclude from Growth Chart --    No data found.  Updated Vital Signs BP (!) 144/81 (BP Location: Left Arm)   Pulse 83   Temp 98.6 F (37 C) (Oral)   Resp 16   Ht 5\' 3"  (1.6 m)   Wt 242 lb (109.8 kg)   LMP 09/20/2015   SpO2 97%   BMI 42.87 kg/m     Physical Exam Vitals and nursing note reviewed.  Constitutional:      General: She is not in acute distress.    Appearance: Normal appearance. She is not ill-appearing or toxic-appearing.  HENT:     Head: Normocephalic and atraumatic.  Eyes:     General: No scleral icterus.       Right eye: No discharge.        Left eye: No discharge.     Conjunctiva/sclera: Conjunctivae normal.  Cardiovascular:     Rate and Rhythm: Normal rate.     Pulses: Normal pulses.  Pulmonary:     Effort: Pulmonary effort is normal. No respiratory distress.  Musculoskeletal:     Cervical  back: Neck supple.     Left ankle: Swelling (moderate swelling lateral ankle) present. Tenderness present over the lateral  malleolus, medial malleolus and ATF ligament. Decreased range of motion. Normal pulse.     Left foot: Swelling present. No tenderness.  Skin:    General: Skin is dry.  Neurological:     General: No focal deficit present.     Mental Status: She is alert. Mental status is at baseline.     Motor: No weakness.     Gait: Gait abnormal.  Psychiatric:        Mood and Affect: Mood normal.      UC Treatments / Results  Labs (all labs ordered are listed, but only abnormal results are displayed) Labs Reviewed - No data to display  EKG   Radiology DG Ankle Complete Left  Result Date: 09/27/2023 CLINICAL DATA:  Twisted ankle. EXAM: LEFT FOOT - COMPLETE 3+ VIEW; LEFT ANKLE COMPLETE - 3+ VIEW COMPARISON:  None Available. FINDINGS: Left ankle: There is a 4 mm curvilinear fleck of bone bordering the distal lateral aspect of the fibula. Moderate lateral malleolar soft tissue swelling, superimposed on diffuse moderate systemic soft tissue edema. Tiny calcifications in the distal calf, likely from chronic venous stasis. Moderate plantar and posterior calcaneal heel spurs. Minimal dorsal navicular-cuneiform degenerative spurring. Left foot: The cortices are intact. No acute fracture dislocation. Mild diffuse likely systemic soft tissue swelling. IMPRESSION: 1. There is a 4 mm curvilinear fleck of bone bordering the distal lateral aspect of the fibula. This is favored to represent a small avulsion fracture. 2. Moderate lateral malleolar soft tissue swelling, superimposed on diffuse moderate systemic soft tissue edema. This suggests the distal fibular avulsion fracture is acute. 3. No acute fracture or dislocation of the left foot. Electronically Signed   By: Neita Garnet M.D.   On: 09/27/2023 14:58   DG Foot Complete Left  Result Date: 09/27/2023 CLINICAL DATA:  Twisted ankle. EXAM:  LEFT FOOT - COMPLETE 3+ VIEW; LEFT ANKLE COMPLETE - 3+ VIEW COMPARISON:  None Available. FINDINGS: Left ankle: There is a 4 mm curvilinear fleck of bone bordering the distal lateral aspect of the fibula. Moderate lateral malleolar soft tissue swelling, superimposed on diffuse moderate systemic soft tissue edema. Tiny calcifications in the distal calf, likely from chronic venous stasis. Moderate plantar and posterior calcaneal heel spurs. Minimal dorsal navicular-cuneiform degenerative spurring. Left foot: The cortices are intact. No acute fracture dislocation. Mild diffuse likely systemic soft tissue swelling. IMPRESSION: 1. There is a 4 mm curvilinear fleck of bone bordering the distal lateral aspect of the fibula. This is favored to represent a small avulsion fracture. 2. Moderate lateral malleolar soft tissue swelling, superimposed on diffuse moderate systemic soft tissue edema. This suggests the distal fibular avulsion fracture is acute. 3. No acute fracture or dislocation of the left foot. Electronically Signed   By: Neita Garnet M.D.   On: 09/27/2023 14:58    Procedures Procedures (including critical care time)  Medications Ordered in UC Medications - No data to display  Initial Impression / Assessment and Plan / UC Course  I have reviewed the triage vital signs and the nursing notes.  Pertinent labs & imaging results that were available during my care of the patient were reviewed by me and considered in my medical decision making (see chart for details).   57 year old female presents for left ankle pain and swelling after falling 3 days ago.  Difficulty bearing weight due to discomfort.  Taking NSAIDs and icing foot/ankle.  X-ray of left foot and ankle obtained today.  Wet read shows concern for possible distal fibula  fracture.  Awaiting official overread by radiology.  Will place patient in cam boot and give crutches at this time.  Discussed fracture care guidelines.  Advise continue OTC meds  as needed.  Advised no weightbearing until cleared by Ortho.  Information for orthopedics given to patient to follow-up.  Work note given so that she is not bearing weight over the next couple of weeks until she is cleared by Ortho.  Radiologist report indicates suspicion for distal fibular avulsion fracture, acute.  Contacted patient to discuss results.  We again reviewed fracture care guidelines, supportive care and following up with Ortho.   Final Clinical Impressions(s) / UC Diagnoses   Final diagnoses:  Other closed fracture of distal end of left fibula, initial encounter  Sprain of anterior talofibular ligament of left ankle, initial encounter     Discharge Instructions      -Your distal fibula looks to be fractured.  I am still waiting on the official radiologist overread and I will contact you once I get that. - We put you in a cam boot.  You should be nonweightbearing over the next few weeks. - You should try to wear the boot at all times.  Elevate extremity and apply ice frequently, every few hours. - May continue with the dual action Advil Tylenol medication. - Try to follow-up with Ortho in the next few days.  You have a condition requiring you to follow up with Orthopedics so please call one of the following office for appointment:   [facility] Address: [address] [place], [place] [address] Phone: [phone]  [facility] [address] [place], [place] [address] Phone: [phone]  [facility] [address], [place], [place] [address] Phone: [phone]      ED Prescriptions   None    PDMP not reviewed this encounter.   Shirlee Latch, PA-C 09/27/23 [phone]

## 2023-09-27 NOTE — Discharge Instructions (Addendum)
-  Your distal fibula looks to be fractured.  I am still waiting on the official radiologist overread and I will contact you once I get that. - We put you in a cam boot.  You should be nonweightbearing over the next few weeks. - You should try to wear the boot at all times.  Elevate extremity and apply ice frequently, every few hours. - May continue with the dual action Advil Tylenol medication. - Try to follow-up with Ortho in the next few days.  You have a condition requiring you to follow up with Orthopedics so please call one of the following office for appointment:   [facility] Address: [address] [place], [place] [address] Phone: [phone]  [facility] [address], [place], [place] [address] Phone: [phone]  [facility] [address], [place], [place] [address] Phone: [phone]

## 2023-09-27 NOTE — ED Triage Notes (Signed)
Pt c/o L ankle pain x4 days. States I twisted ankle on Saturday and it has not gotten any better - Entered by patient

## 2024-02-03 ENCOUNTER — Telehealth: Payer: Self-pay

## 2024-02-03 NOTE — Telephone Encounter (Signed)
PALLAS: PALBOCICLIB COLLABORATIVE ADJUVANT STUDY: A RANDOMIZED PHASE III TRIAL OF PALBOCICLIB WITH STANDARD ADJUVANT ENDOCRINE THERAPY VERSUS STANDARD ADJUVANT ENDOCRINE THERAPY ALONE FOR HORMONE RECEPTOR POSITIVE (HR+)/HUMAN EPIDERMAL GROWTH FACTOR RECEPTOR 2 (HER2)-NEGATIVE EARLY BREAST CANCER   8 Year Follow Up Phone Call:  Research nurse attempted to call patient today for her 8 year follow up review for PALLAS. Message was left on a secure, identified voice mail for the patient to return call, or research nurse will attempt to contact her on Monday.  Chriss Driver, RN 02/03/24 4:02 PM

## 2024-02-07 ENCOUNTER — Telehealth: Payer: Self-pay

## 2024-02-07 NOTE — Telephone Encounter (Signed)
Research nurse attempted to reach patient again today for her 8 year follow up phone call for the PALLAS study. Message left on secure voice mail for patient to return call, office number for research provided with reason for attempt to contact patient.  Chriss Driver, RN 02/07/24 12:01 PM

## 2024-02-13 ENCOUNTER — Telehealth: Payer: Self-pay

## 2024-02-13 DIAGNOSIS — Z170 Estrogen receptor positive status [ER+]: Secondary | ICD-10-CM

## 2024-02-13 NOTE — Telephone Encounter (Signed)
 PALLAS: PALBOCICLIB COLLABORATIVE ADJUVANT STUDY: A RANDOMIZED PHASE III TRIAL OF PALBOCICLIB WITH STANDARD ADJUVANT ENDOCRINE THERAPY VERSUS STANDARD ADJUVANT ENDOCRINE THERAPY ALONE FOR HORMONE RECEPTOR POSITIVE (HR+)/HUMAN EPIDERMAL GROWTH FACTOR RECEPTOR 2 (HER2)-NEGATIVE EARLY BREAST CANCER   Research nurse attempt to get in touch with the patient again today for her 8 year follow up phone call for PALLAS. Message left on her identified voice mail. Call to mother [facility] listed as her alternate contact. Research nurse spoke with Paula [name] and explained the reason for the call to her daughter. She states she will reach out to the patient and see if she can get her to call us. Paula [name] was thanked for her time and assistance.

## 2024-02-14 NOTE — Telephone Encounter (Signed)
 Patient returned call today to this research nurse. She states her work schedule has been difficult for her to call back.  Concomitant Medications: Aspirin, MVI, Ozempic, Calcium and Vitamin D Denies any new anti-cancer medications SAE's: None reported Disease Monitoring: Annual Mammogram's and provider exams for monitoring. Mammogram on 08/26/2023 was negative.  Covid 19 status / vaccination: Vaccines completed in 2021, does not currently have COVID  Patient states she does have osteoporosis from taking her anastrazole. She has completed Reclast x 3 years. She continues to take her calcium and Vitamin D. Patient denies any change in her medical conditions. Patient is aware that she will receive a follow up call from the research office in the next year for her follow up per protocol requirements and is in agreement with this plan.  Chriss Driver, RN 02/14/24 10:41 AM

## 2024-04-20 ENCOUNTER — Ambulatory Visit
Admission: RE | Admit: 2024-04-20 | Discharge: 2024-04-20 | Disposition: A | Discharge status: Home / Self Care | Source: Ambulatory Visit | Attending: Emergency Medicine | Admitting: Emergency Medicine

## 2024-04-20 ENCOUNTER — Encounter: Payer: Self-pay | Admitting: Internal Medicine

## 2024-04-20 VITALS — BP 162/96 | HR 86 | Temp 98.4°F | Ht 63.0 in | Wt 245.0 lb

## 2024-04-20 DIAGNOSIS — L03032 Cellulitis of left toe: Secondary | ICD-10-CM

## 2024-04-20 MED ORDER — AMOXICILLIN-POT CLAVULANATE 875-125 MG PO TABS: 1.0000 | ORAL_TABLET | Freq: Two times a day (BID) | ORAL | 0 refills | Status: AC

## 2024-04-20 NOTE — ED Triage Notes (Signed)
 Pt c/o left 1st toe problem  Pt states that she has had a ingrown toenail x1week  Pt toe is red and swollen

## 2024-04-20 NOTE — Discharge Instructions (Addendum)
 Take the Augmentin and 175 mg twice daily with food for 7 days for treatment of your cuticle infection.  Soak your foot in warm water and Epsom salts 2-3 times a day to see if you get the infection to come to ahead and rupture on its own.  I have made an urgent referral to podiatry for evaluation and management, to include possible incision and drainage.  If you develop any increased redness, swelling to your toe, red streaks going up your toe or foot, or fevers you need to go to the ER for evaluation.

## 2024-04-20 NOTE — ED Provider Notes (Signed)
 [facility]    CSN: [id number] Arrival date & time: 04/20/24  1251      History   Chief Complaint Chief Complaint  Patient presents with   Nail Problem    HPI Paula [name] is a 58 y.o. female.   HPI  58 year old female with past medical history significant for diabetes, sleep apnea, HPV, and breast cancer presents for evaluation of pain and swelling to her left great toe that has been going on for the past 2 weeks.  She denies any fever or drainage.  She reports that her fasting sugars in the morning have been normal which is the 130s to 140s.  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   Postoperative state 12/01/2015   CIN II (cervical intraepithelial neoplasia II) 12/01/2015   Fat necrosis of breast 08/15/2015   Infected postoperative seroma 08/01/2015   Carcinoma of upper-inner quadrant of right breast in female, estrogen receptor positive (HCC) 06/24/2015   Acute respiratory failure with hypoxia (HCC) 02/27/2015   Influenza due to influenza A virus 02/27/2015   Besnier-Boeck disease 02/27/2015   Adenopathy, hilar 02/26/2015   Accumulation of fluid in tissues 02/26/2015   Lung nodule, solitary 02/26/2015   Compulsive tobacco user syndrome 02/26/2015   Infection of urinary tract 02/26/2015    Past Surgical History:  Procedure Laterality Date   ABDOMINAL HYSTERECTOMY  12/01/2015   Procedure: total abdominal hysterectomty, bilateral salpingo- oopharectomy, cystoscopy ;  Surgeon: Prescilla Brod, MD;  Location: [facility];   Service: Gynecology;;   BREAST BIOPSY Right 6.7.16   INVASIVE MAMMARY CARCINOMA.    BREAST LUMPECTOMY Right 2016   BREAST CA   BREAST LUMPECTOMY WITH SENTINEL LYMPH NODE BIOPSY Right 07/17/2015   Procedure: RIGHT BREAST WIDE EXCISION WITH SENTINEL NODE BIOPSY;  Surgeon: Marshall Skeeter, MD;  Location: [facility];  Service: General;  Laterality: Right;   BREAST SURGERY Right 05/28/15   COLPOSCOPY  05/28/15   High grade squamous intraepithelial lesion   LAPAROSCOPIC VAGINAL HYSTERECTOMY WITH SALPINGO OOPHORECTOMY Bilateral 12/01/2015   Procedure: LAPAROSCOPIC ASSISTED VAGINAL HYSTERECTOMY WITH SALPINGO OOPHORECTOMY-converted to;  Surgeon: Prescilla Brod, MD;  Location: [facility];  Service: Gynecology;  Laterality: Bilateral;   WISDOM TOOTH EXTRACTION      OB History     Gravida  1   Para  1   Term      Preterm      AB      Living  1      SAB      IAB      Ectopic      Multiple      Live Births           Obstetric Comments  1st Menstrual Cycle:  11 1st Pregnancy:  24           Home Medications    Prior to Admission medications   Medication Sig Start Date End Date Taking? Authorizing Provider  amoxicillin-clavulanate (AUGMENTIN) 875-125 MG tablet Take 1 tablet by mouth every  12 (twelve) hours for 7 days. 04/20/24 04/27/24 Yes Kent Pear, NP  aspirin 81 MG chewable tablet Chew 81 mg by mouth daily.   Yes [provider]  Multiple Vitamins-Minerals (MULTIVITAMIN ADULT) CHEW Chew 2 Doses by mouth daily.   Yes [provider]  Semaglutide, 2 MG/DOSE, (OZEMPIC, 2 MG/DOSE,) 8 MG/3ML SOPN SMARTSIG:0.75 Milliliter(s) SUB-Q Once a Week   Yes [provider]    Family History Family History  Problem Relation Age of Onset   Cancer Maternal Aunt 80       breast   Breast cancer Maternal Aunt     Social History Social History   Tobacco Use   Smoking status: Former    Current packs/day: 1.00    Average packs/day: 1 pack/day for 36.0  years (36.0 ttl pk-yrs)    Types: Cigarettes   Smokeless tobacco: Never  Vaping Use   Vaping status: Never Used  Substance Use Topics   Alcohol use: Yes    Alcohol/week: 0.0 standard drinks of alcohol    Comment: occ   Drug use: No     Allergies   Patient has no known allergies.   Review of Systems Review of Systems  Constitutional:  Negative for fever.  Skin:  Positive for color change.     Physical Exam Triage Vital Signs ED Triage Vitals  Encounter Vitals Group     BP      Systolic BP Percentile      Diastolic BP Percentile      Pulse      Resp      Temp      Temp src      SpO2      Weight      Height      Head Circumference      Peak Flow      Pain Score      Pain Loc      Pain Education      Exclude from Growth Chart    No data found.  Updated Vital Signs BP (!) 162/96 (BP Location: Left Arm)   Pulse 86   Temp 98.4 F (36.9 C) (Oral)   Ht 5\' 3"  (1.6 m)   Wt 245 lb (111.1 kg)   LMP 09/20/2015   SpO2 93%   BMI 43.40 kg/m   Visual Acuity Right Eye Distance:   Left Eye Distance:   Bilateral Distance:    Right Eye Near:   Left Eye Near:    Bilateral Near:     Physical Exam Vitals and nursing note reviewed.  Constitutional:      Appearance: Normal appearance.  Musculoskeletal:        General: Swelling and tenderness present. No signs of injury.  Skin:    Capillary Refill: Capillary refill takes less than 2 seconds.     Findings: Erythema present.  Neurological:     General: No focal deficit present.     Mental Status: She is alert and oriented to person, place, and time.      UC Treatments / Results  Labs (all labs ordered are listed, but only abnormal results are displayed) Labs Reviewed - No data to display  EKG   Radiology No results found.  Procedures Procedures (including critical care time)  Medications Ordered in UC Medications - No data to display  Initial Impression / Assessment and Plan / UC Course  I have  reviewed the triage vital signs and the nursing notes.  Pertinent labs &  imaging results that were available during my care of the patient were reviewed by me and considered in my medical decision making (see chart for details).   Patient is a pleasant, nontoxic-appearing 58 year old female presenting for evaluation of of pain and swelling to the cuticle of her left great toenail as outlined in HPI above.  As you can see in image above, there is erythema and edema to the medial aspect of the cuticle that extends back to to the level of the IP joint.  It does not progress medially or laterally.  Given that the patient is diabetic I will refer her to podiatry for further management and possible I&D.  I will start her on Augmentin 875 mg twice daily with food for 7 days and have her soak her foot in warm water and Epsom salts 2-3 times a day to see if she can get the infection to come to ahead and rupture on its own.  ER and return precautions reviewed.   Final Clinical Impressions(s) / UC Diagnoses   Final diagnoses:  Paronychia of great toe of left foot     Discharge Instructions      Take the Augmentin and 175 mg twice daily with food for 7 days for treatment of your cuticle infection.  Soak your foot in warm water and Epsom salts 2-3 times a day to see if you get the infection to come to ahead and rupture on its own.  I have made an urgent referral to podiatry for evaluation and management, to include possible incision and drainage.  If you develop any increased redness, swelling to your toe, red streaks going up your toe or foot, or fevers you need to go to the ER for evaluation.     ED Prescriptions     Medication Sig Dispense Auth. Provider   amoxicillin-clavulanate (AUGMENTIN) 875-125 MG tablet Take 1 tablet by mouth every 12 (twelve) hours for 7 days. 14 tablet Kent Pear, NP      PDMP not reviewed this encounter.   Kent Pear, NP 04/20/24 1326

## 2024-05-23 ENCOUNTER — Encounter: Payer: Self-pay | Admitting: Internal Medicine

## 2024-06-01 ENCOUNTER — Ambulatory Visit (INDEPENDENT_AMBULATORY_CARE_PROVIDER_SITE_OTHER)

## 2024-06-01 ENCOUNTER — Other Ambulatory Visit: Payer: Self-pay | Admitting: Physician Assistant

## 2024-06-01 ENCOUNTER — Ambulatory Visit

## 2024-06-01 ENCOUNTER — Ambulatory Visit: Admitting: Podiatry

## 2024-06-01 ENCOUNTER — Encounter: Payer: Self-pay | Admitting: Podiatry

## 2024-06-01 DIAGNOSIS — Z122 Encounter for screening for malignant neoplasm of respiratory organs: Secondary | ICD-10-CM

## 2024-06-01 DIAGNOSIS — M779 Enthesopathy, unspecified: Secondary | ICD-10-CM

## 2024-06-01 DIAGNOSIS — M65972 Unspecified synovitis and tenosynovitis, left ankle and foot: Secondary | ICD-10-CM | POA: Diagnosis not present

## 2024-06-01 MED ORDER — BETAMETHASONE SOD PHOS & ACET 6 (3-3) MG/ML IJ SUSP
3.0000 mg | Freq: Once | INTRAMUSCULAR | Status: AC
Start: 2024-06-01 — End: 2024-06-01
  Administered 2024-06-01: 3 mg via INTRA_ARTICULAR

## 2024-06-01 NOTE — Progress Notes (Signed)
 Chief Complaint  Patient presents with   Foot Pain    Pt is here due to left foot and ankle pain pt states the pain has been there for a while she is a diabetic, states top of foot sometime swells, ankle hurts from previous injury.    Subjective:  58 y.o. female presenting today as a new patient for evaluation of left ankle pain.  Onset for few months now.  She does have a history of avulsion fracture to the left ankle.  She works on her feet at [employer].  She is also had intermittent ingrown toenails to the bilateral great toes.  Currently they are asymptomatic but she would like to have them evaluated  Past Medical History:  Diagnosis Date   BRCA negative 2016   Breast cancer (HCC) 05/28/15   pT1c,N1a:2/3 SLN positive for macrometastisi,ER positive, PR positive, HER-2/neu not overexpressed c Radiation   Dog bite of left lower leg 03/07/15   HPV in female 2016   Personal history of radiation therapy 2016   BREAST CA   Pneumonia 02/2015   Sleep apnea    STD (female)     Past Surgical History:  Procedure Laterality Date   ABDOMINAL HYSTERECTOMY  12/01/2015   Procedure: total abdominal hysterectomty, bilateral salpingo- oopharectomy, cystoscopy ;  Surgeon: Prescilla Brod, MD;  Location: [facility];  Service: Gynecology;;   BREAST BIOPSY Right 6.7.16   INVASIVE MAMMARY CARCINOMA.    BREAST LUMPECTOMY Right 2016   BREAST CA   BREAST LUMPECTOMY WITH SENTINEL LYMPH NODE BIOPSY Right 07/17/2015   Procedure: RIGHT BREAST WIDE EXCISION WITH SENTINEL NODE BIOPSY;  Surgeon: Marshall Skeeter, MD;  Location: [facility];  Service: General;  Laterality: Right;   BREAST SURGERY Right 05/28/15   COLPOSCOPY  05/28/15   High grade squamous intraepithelial lesion   LAPAROSCOPIC VAGINAL HYSTERECTOMY WITH SALPINGO OOPHORECTOMY Bilateral 12/01/2015   Procedure: LAPAROSCOPIC ASSISTED VAGINAL HYSTERECTOMY WITH SALPINGO OOPHORECTOMY-converted to;  Surgeon: Prescilla Brod, MD;  Location: [facility];   Service: Gynecology;  Laterality: Bilateral;   WISDOM TOOTH EXTRACTION      No Known Allergies  Objective / Physical Exam:  General:  The patient is alert and oriented x3 in no acute distress. Dermatology:  Skin is warm, dry and supple bilateral lower extremities. Negative for open lesions or macerations. Vascular:  Palpable pedal pulses bilaterally. No edema or erythema noted. Capillary refill within normal limits. Neurological:  Epicritic and protective threshold grossly intact bilaterally.  Musculoskeletal Exam:  Pain on palpation to the anterior lateral medial aspects of the patient's left ankle. Mild edema noted. Range of motion within normal limits to all pedal and ankle joints bilateral. Muscle strength 5/5 in all groups bilateral.   Radiographic Exam B/L foot and ankle 06/01/2024:  Normal osseous mineralization. Joint spaces preserved. No fracture/dislocation/boney destruction.  Chronic appearing avulsion fracture to the distal fibula of the left ankle noted  Assessment: 1.  Synovitis left ankle 2.  History of avulsion fracture distal fibula left 3.  Intermittent ingrown toenail lateral border left great toe  Plan of Care:  -Patient was evaluated. X-Rays reviewed.  -Injection of 0.5 mL Celestone Soluspan injected in the patient's left ankle. -For now recommend conservative treatment management for the left hallux ingrown toenail.  She says that currently is asymptomatic.  She would not like to proceed with any invasive type procedure for the moment -Continue wearing good supportive tennis shoes and sneakers -Return to clinic 6 weeks  *Works at [employer]  in [place], [place]   Dot Gazella, DPM [facility]  Dr. Dot Gazella, DPM    [address] [place], [place] [address]                Office [phone]  Fax [phone]

## 2024-06-04 ENCOUNTER — Other Ambulatory Visit: Payer: Self-pay | Admitting: Physician Assistant

## 2024-06-04 DIAGNOSIS — M8588 Other specified disorders of bone density and structure, other site: Secondary | ICD-10-CM

## 2024-06-04 DIAGNOSIS — Z1231 Encounter for screening mammogram for malignant neoplasm of breast: Secondary | ICD-10-CM

## 2024-07-13 ENCOUNTER — Ambulatory Visit: Admitting: Podiatry

## 2024-11-05 ENCOUNTER — Ambulatory Visit
Admission: RE | Admit: 2024-11-05 | Discharge: 2024-11-05 | Disposition: A | Discharge status: Home / Self Care | Source: Ambulatory Visit | Attending: Physician Assistant | Admitting: Physician Assistant

## 2024-11-05 DIAGNOSIS — Z1231 Encounter for screening mammogram for malignant neoplasm of breast: Secondary | ICD-10-CM | POA: Diagnosis present

## 2024-11-05 DIAGNOSIS — M8588 Other specified disorders of bone density and structure, other site: Secondary | ICD-10-CM
